# Patient Record
Sex: Male | Born: 1970 | Race: White | Hispanic: No | Smoking: Former smoker
Health system: Southern US, Community
[De-identification: ages and names within clinical notes are randomized; demographics above are authoritative.]

## PROBLEM LIST (undated history)

## (undated) DIAGNOSIS — M722 Plantar fascial fibromatosis: Secondary | ICD-10-CM

## (undated) DIAGNOSIS — E785 Hyperlipidemia, unspecified: Secondary | ICD-10-CM

## (undated) DIAGNOSIS — E119 Type 2 diabetes mellitus without complications: Secondary | ICD-10-CM

## (undated) DIAGNOSIS — I10 Essential (primary) hypertension: Secondary | ICD-10-CM

## (undated) DIAGNOSIS — R7303 Prediabetes: Secondary | ICD-10-CM

## (undated) DIAGNOSIS — F32A Depression, unspecified: Secondary | ICD-10-CM

## (undated) DIAGNOSIS — E559 Vitamin D deficiency, unspecified: Secondary | ICD-10-CM

## (undated) HISTORY — DX: Prediabetes: R73.03

## (undated) HISTORY — DX: Essential (primary) hypertension: I10

## (undated) HISTORY — DX: Morbid (severe) obesity due to excess calories: E66.01

## (undated) HISTORY — DX: Hyperlipidemia, unspecified: E78.5

## (undated) HISTORY — DX: Vitamin D deficiency, unspecified: E55.9

## (undated) HISTORY — DX: Type 2 diabetes mellitus without complications: E11.9

## (undated) HISTORY — DX: Plantar fascial fibromatosis: M72.2

## (undated) HISTORY — DX: Depression, unspecified: F32.A

---

## 1997-12-31 ENCOUNTER — Ambulatory Visit (HOSPITAL_COMMUNITY): Admission: RE | Admit: 1997-12-31 | Discharge: 1997-12-31 | Payer: Self-pay | Admitting: Urology

## 1997-12-31 ENCOUNTER — Encounter: Payer: Self-pay | Admitting: Urology

## 1999-05-23 ENCOUNTER — Encounter: Payer: Self-pay | Admitting: Internal Medicine

## 1999-05-23 ENCOUNTER — Ambulatory Visit (HOSPITAL_COMMUNITY): Admission: RE | Admit: 1999-05-23 | Discharge: 1999-05-23 | Payer: Self-pay | Admitting: Internal Medicine

## 2003-03-27 ENCOUNTER — Ambulatory Visit (HOSPITAL_COMMUNITY): Admission: RE | Admit: 2003-03-27 | Discharge: 2003-03-27 | Payer: Self-pay | Admitting: Internal Medicine

## 2003-08-01 ENCOUNTER — Emergency Department (HOSPITAL_COMMUNITY): Admission: EM | Admit: 2003-08-01 | Discharge: 2003-08-01 | Payer: Self-pay | Admitting: Emergency Medicine

## 2005-03-31 ENCOUNTER — Ambulatory Visit (HOSPITAL_COMMUNITY): Admission: RE | Admit: 2005-03-31 | Discharge: 2005-03-31 | Payer: Self-pay | Admitting: Internal Medicine

## 2007-10-21 ENCOUNTER — Ambulatory Visit: Payer: Self-pay | Admitting: Cardiovascular Disease

## 2007-10-21 ENCOUNTER — Ambulatory Visit: Payer: Self-pay

## 2008-04-27 ENCOUNTER — Encounter: Admission: RE | Admit: 2008-04-27 | Discharge: 2008-04-27 | Payer: Self-pay | Admitting: Internal Medicine

## 2010-03-01 ENCOUNTER — Ambulatory Visit (HOSPITAL_COMMUNITY)
Admission: RE | Admit: 2010-03-01 | Discharge: 2010-03-01 | Payer: Self-pay | Source: Home / Self Care | Attending: Internal Medicine | Admitting: Internal Medicine

## 2010-05-17 ENCOUNTER — Other Ambulatory Visit (HOSPITAL_COMMUNITY): Payer: Self-pay | Admitting: Internal Medicine

## 2010-05-17 ENCOUNTER — Ambulatory Visit (HOSPITAL_COMMUNITY)
Admission: RE | Admit: 2010-05-17 | Discharge: 2010-05-17 | Disposition: A | Discharge status: Home / Self Care | Payer: PRIVATE HEALTH INSURANCE | Source: Ambulatory Visit | Attending: Internal Medicine | Admitting: Internal Medicine

## 2010-05-17 DIAGNOSIS — R52 Pain, unspecified: Secondary | ICD-10-CM

## 2010-05-17 DIAGNOSIS — M25569 Pain in unspecified knee: Secondary | ICD-10-CM | POA: Insufficient documentation

## 2010-07-26 NOTE — Procedures (Signed)
[facility]                              EXERCISE Zada Girt   EMILIAN, STAWICKI                     MRN:          [id number]  DATE:10/21/2007                            DOB:          07-08-1970    REFERRING PHYSICIAN:  Lucky Cowboy, M.D.   PROCEDURE:  Exercise treadmill stress study.   INDICATION:  Mr. Hurrell is a pleasant 40 year old Caucasian male with  past medical history significant for tobacco abuse, hypertension,  hyperlipidemia, moderate obesity, and a family history of premature  coronary artery disease who had one episode of substernal chest pressure  while at rest 2 weeks ago.  He has had no further episodes of chest  pressure.  He is an active gentleman who is employed as a Investment banker, operational and plays  golf regularly with no symptoms of shortness of breath or chest pain.  An exercise treadmill stress test was done today to evaluate his  functional capacity as well as his anginal symptoms.   The patient followed the Bruce protocol.  His resting heart rate was 72  with a resting blood pressure of 121/63.  He exercised for 7 minutes,  entering stage 3 of the Bruce protocol and achieving 8.50 METS.  Peak  blood pressure was 180/72 with a peak heart rate of 142.  He experienced  no chest pain while exercising.  He did not achieve his target heart  rate, but did achieve 80% of his maximum predicted heart rate for age.  He elected to stop his treadmill test secondary to fatigue and dyspnea.  At 2 minutes of recovery, his heart rate had returned to 109.  His blood  pressure returned to 142/65 with the completion of the test.  There were  no diagnostic ST segment changes and no arrhythmias during the exercise.   ASSESSMENT:  No angina or arrhythmias with an exercise treadmill stress  test.  No diagnostic EKG changes at 80% of maximum predicted heart rate  for age.   RECOMMENDATION:  The patient exhibited moderate deconditioning on his  stress  test.  There were no diagnostic changes that were worrisome for  coronary artery disease.  I have encouraged him to institute a weight  loss plan with a routine exercise program.  He is to follow up with Dr.  Oneta Rack and is to alert [place] if he has any recurrence of his chest  discomfort.   No changes in medications made.     Verne Carrow, MD  Electronically Signed    CM/MedQ  DD: 10/21/2007  DT: 10/22/2007  Job #: [id number]   cc:   Lucky Cowboy, M.D.  Rollene Rotunda, MD, [facility] [address]

## 2011-05-09 ENCOUNTER — Encounter: Payer: Self-pay | Admitting: Gastroenterology

## 2011-05-15 ENCOUNTER — Encounter: Payer: Self-pay | Admitting: *Deleted

## 2011-05-18 ENCOUNTER — Ambulatory Visit: Payer: PRIVATE HEALTH INSURANCE | Admitting: Gastroenterology

## 2011-05-30 ENCOUNTER — Ambulatory Visit: Payer: PRIVATE HEALTH INSURANCE | Admitting: Gastroenterology

## 2011-10-17 ENCOUNTER — Other Ambulatory Visit (HOSPITAL_COMMUNITY): Payer: Self-pay | Admitting: Physician Assistant

## 2011-10-17 ENCOUNTER — Ambulatory Visit (HOSPITAL_COMMUNITY)
Admission: RE | Admit: 2011-10-17 | Discharge: 2011-10-17 | Disposition: A | Discharge status: Home / Self Care | Payer: Managed Care, Other (non HMO) | Source: Ambulatory Visit | Attending: Physician Assistant | Admitting: Physician Assistant

## 2011-10-17 DIAGNOSIS — I10 Essential (primary) hypertension: Secondary | ICD-10-CM
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
## 2013-06-06 IMAGING — CR DG CHEST 2V
2 series · 2 of 2 positions shown · non-contrast
Comparison: 03/31/2005

CLINICAL DATA: Hypertension.

CHEST - 2 VIEW

[view not recorded (1 of 2)]
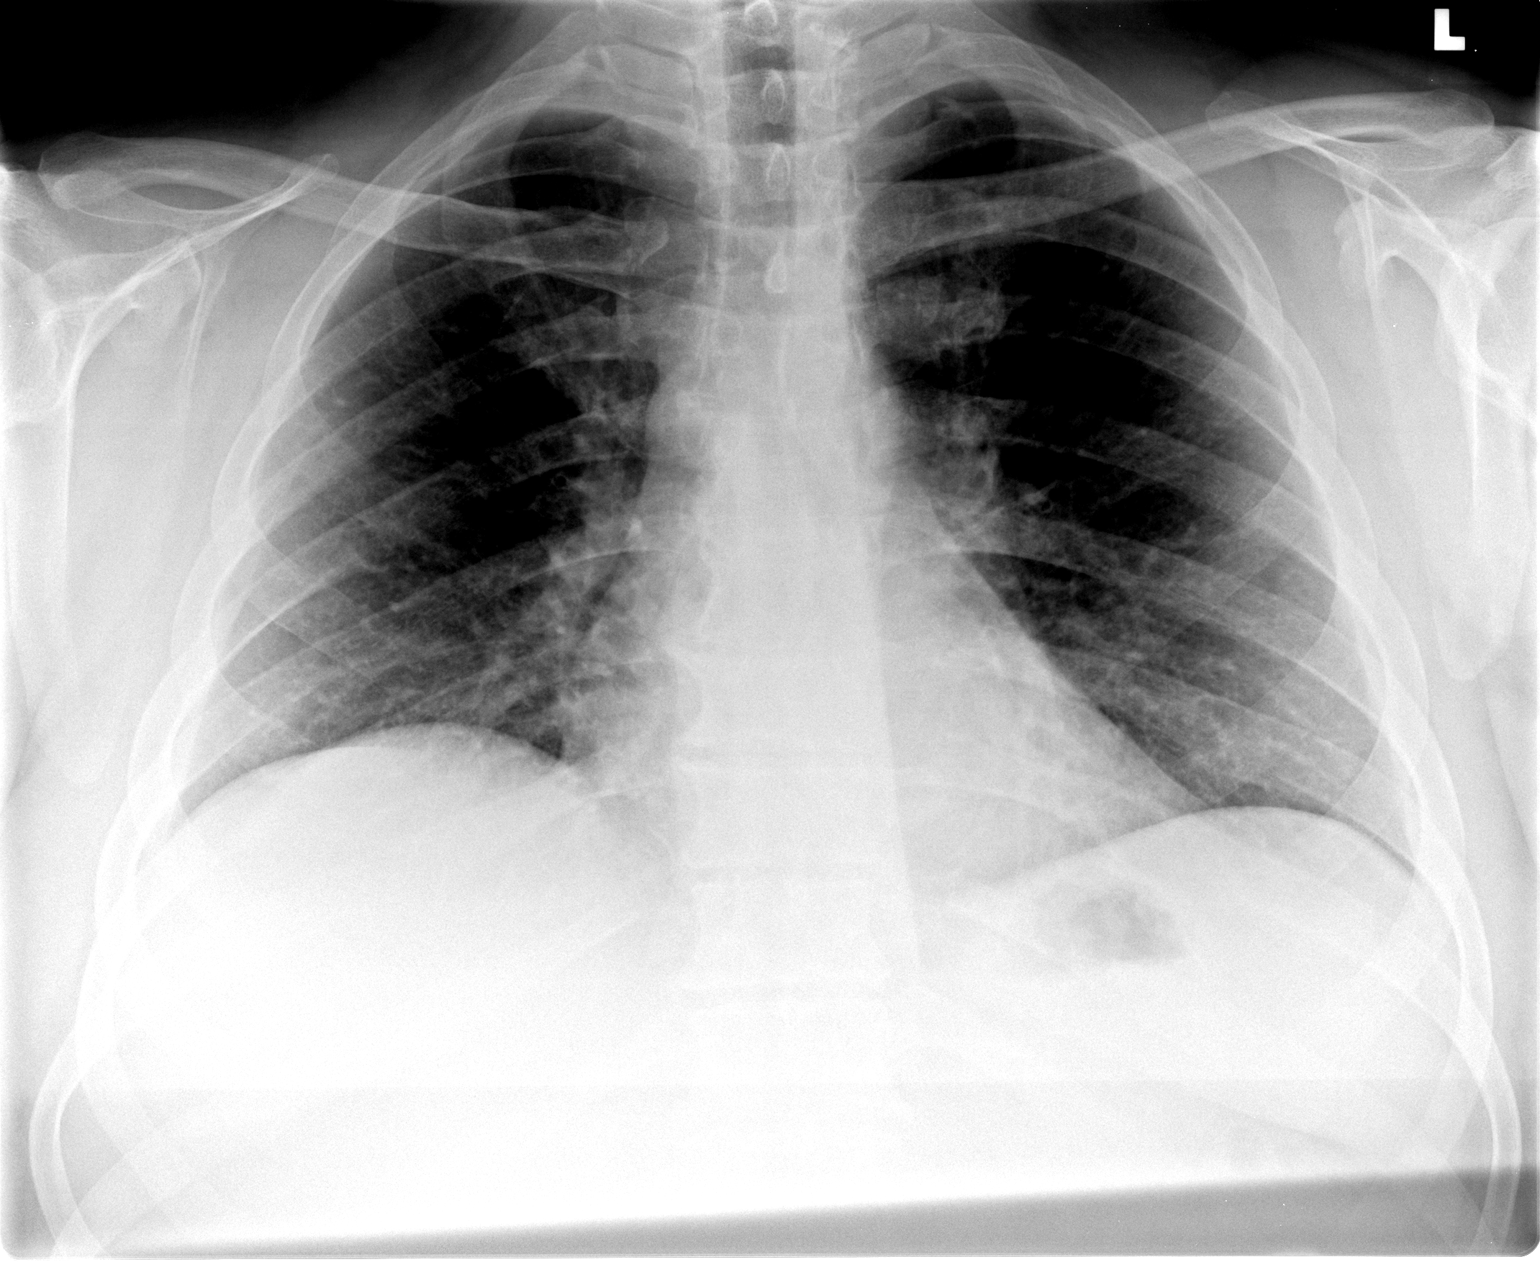

[view not recorded (2 of 2)]
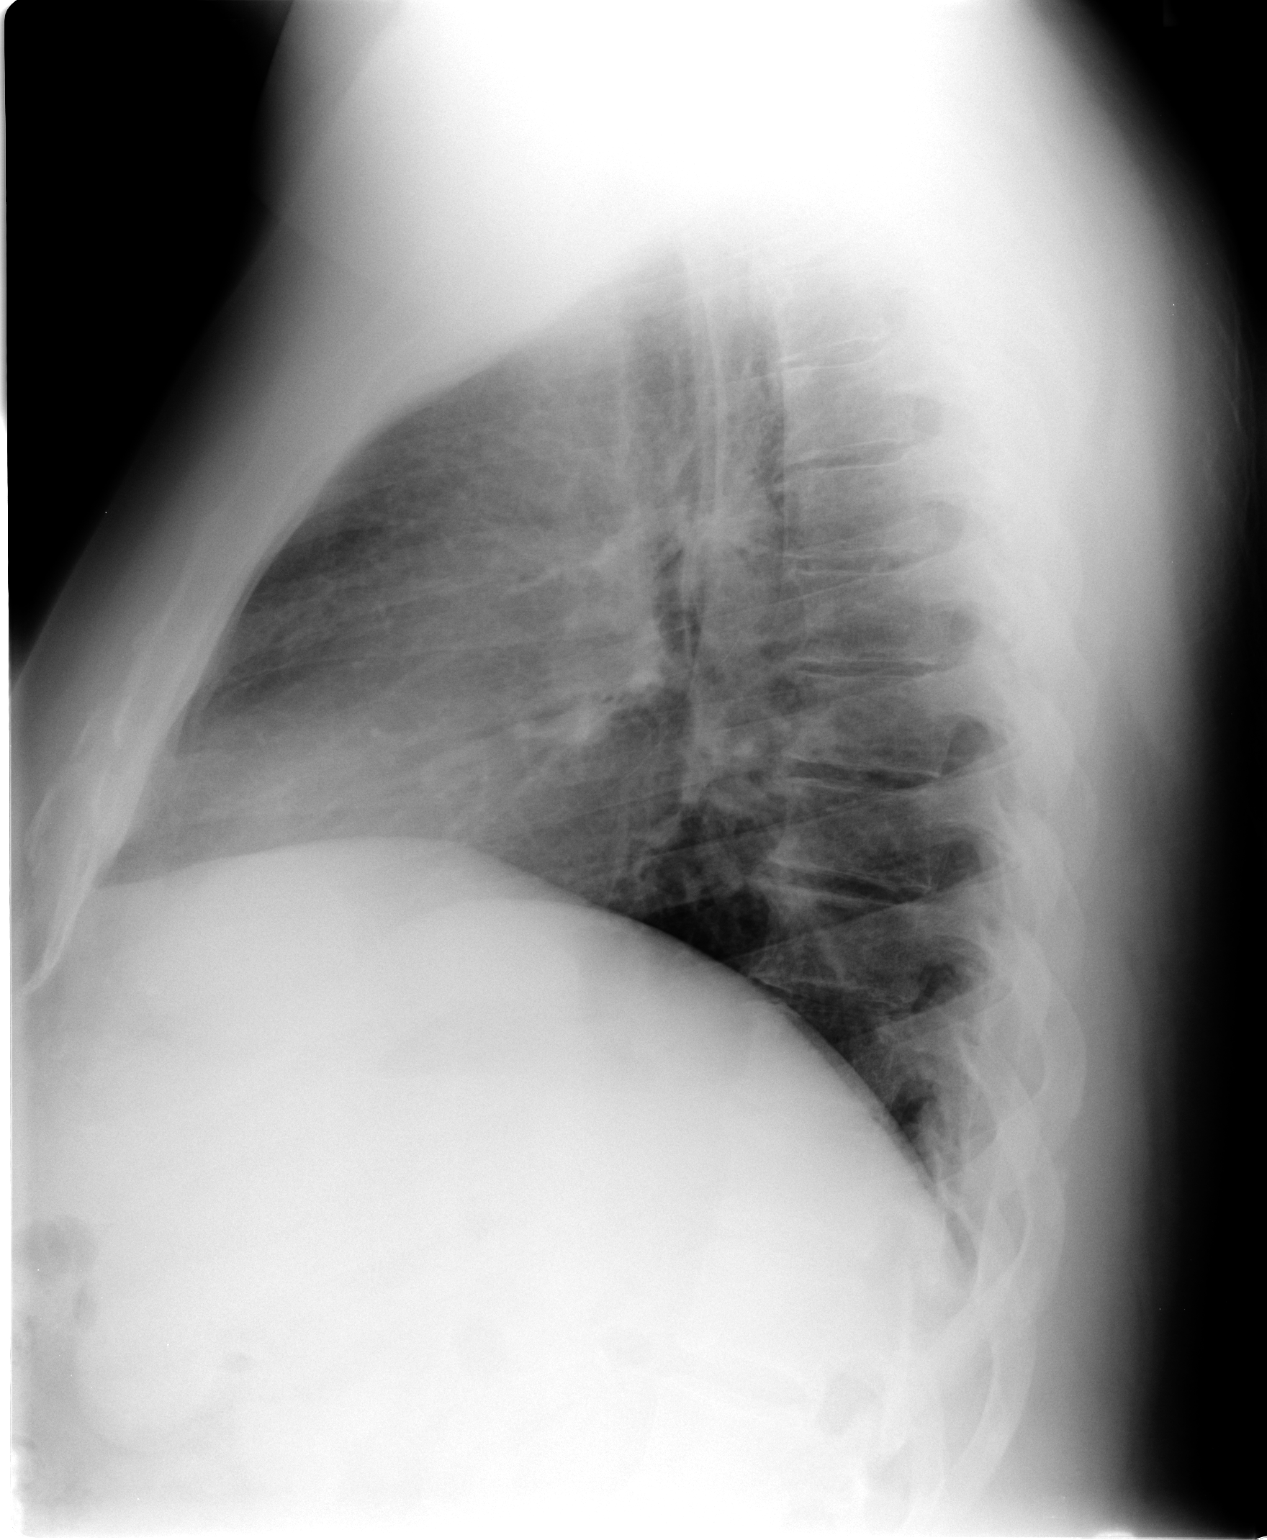

[2 of 2 positions shown; findings below may reference images not displayed]

FINDINGS: Mild central peribronchial thickening. Heart and
mediastinal contours are within normal limits.  No focal opacities
or effusions.  No acute bony abnormality.
IMPRESSION: Stable mild chronic bronchitic changes.

## 2016-04-05 ENCOUNTER — Encounter: Payer: Self-pay | Admitting: Internal Medicine

## 2016-04-05 ENCOUNTER — Ambulatory Visit (INDEPENDENT_AMBULATORY_CARE_PROVIDER_SITE_OTHER): Payer: 59 | Admitting: Internal Medicine

## 2016-04-05 ENCOUNTER — Other Ambulatory Visit: Payer: Self-pay | Admitting: Internal Medicine

## 2016-04-05 VITALS — BP 166/96 | HR 100 | Temp 97.5°F | Resp 16 | Ht 72.75 in | Wt 289.2 lb

## 2016-04-05 DIAGNOSIS — R7303 Prediabetes: Secondary | ICD-10-CM

## 2016-04-05 DIAGNOSIS — Z79899 Other long term (current) drug therapy: Secondary | ICD-10-CM

## 2016-04-05 DIAGNOSIS — F419 Anxiety disorder, unspecified: Secondary | ICD-10-CM

## 2016-04-05 DIAGNOSIS — E782 Mixed hyperlipidemia: Secondary | ICD-10-CM

## 2016-04-05 DIAGNOSIS — I10 Essential (primary) hypertension: Secondary | ICD-10-CM

## 2016-04-05 DIAGNOSIS — Z0001 Encounter for general adult medical examination with abnormal findings: Secondary | ICD-10-CM | POA: Insufficient documentation

## 2016-04-05 DIAGNOSIS — E559 Vitamin D deficiency, unspecified: Secondary | ICD-10-CM

## 2016-04-05 DIAGNOSIS — E785 Hyperlipidemia, unspecified: Secondary | ICD-10-CM | POA: Insufficient documentation

## 2016-04-05 DIAGNOSIS — E1169 Type 2 diabetes mellitus with other specified complication: Secondary | ICD-10-CM | POA: Insufficient documentation

## 2016-04-05 DIAGNOSIS — E119 Type 2 diabetes mellitus without complications: Secondary | ICD-10-CM | POA: Insufficient documentation

## 2016-04-05 MED ORDER — CITALOPRAM HYDROBROMIDE 40 MG PO TABS: ORAL_TABLET | ORAL | 1 refills | Status: DC

## 2016-04-05 MED ORDER — BISOPROLOL-HYDROCHLOROTHIAZIDE 5-6.25 MG PO TABS: ORAL_TABLET | ORAL | 1 refills | Status: DC

## 2016-04-05 NOTE — Patient Instructions (Signed)

## 2016-04-05 NOTE — Progress Notes (Signed)
[facility] Unk Pinto, M.D.        Uvaldo Bristle. Silverio Lay, P.A.-C       Starlyn Skeans, P.A.-C  [facility]                [address] [place], [place] [id number] Telephone [phone] Telefax [phone] ______________________________________________________________________     This very nice 46 y.o. separated WM returns to the practice after a 4-5 yr absence now moving back to [place] from the [place] and a failed marriage. As a result of this, he does report some chronic anxiety.  In the past he had been treated for  Hypertension, Hyperlipidemia, Pre-Diabetes and Vitamin D Deficiency but since his return last May 2017, he has not been on any treatment. .      Today's BP is not at goal at 166/96.  Patient has had no complaints of any cardiac type chest pain, palpitations, dyspnea/orthopnea/PND, dizziness, claudication, or dependent edema.     He alleges his lipids had been controlled on alternate day Pravastatin as he had been intolerant of daily statin therapy in the past due to severe myalgias. Also he had been on Tricor for Hypertriglyceridemia.         Other hx/o is that of prediabetes consequent of his severe Morbid Obesity (BMI 38+). He denies symptoms of reactive hypoglycemia, diabetic polys, paresthesias or visual blurring.       Further, the patient also has history of Vitamin D Deficiency and  does not supplement vitamin D  At the present time.   Current meds are ASA 325 mg and Fish Oil 100 mg daily.   No Known Allergies   PMHx:   Past Medical History:  Diagnosis Date  . Hyperlipidemia   . Hypertension   . Morbid obesity ([place])   . Plantar fasciitis   . Prediabetes   . Vitamin D deficiency     History reviewed. No pertinent surgical history.   FH (+) for HTN, HLD, Sudden Death , DM  SHx:  Separated x 2 years wit impending divorce. Employed as a Biomedical scientist.   Systems  Review:  Constitutional: Denies fever, chills, wt changes, headaches, insomnia, fatigue, night sweats, change in appetite. Eyes: Denies redness, blurred vision, diplopia, discharge, itchy, watery eyes.  ENT: Denies discharge, congestion, post nasal drip, epistaxis, sore throat, earache, hearing loss, dental pain, tinnitus, vertigo, sinus pain, snoring.  CV: Denies chest pain, palpitations, irregular heartbeat, syncope, dyspnea, diaphoresis, orthopnea, PND, claudication or edema. Respiratory: denies cough, dyspnea, DOE, pleurisy, hoarseness, laryngitis, wheezing.  Gastrointestinal: Denies dysphagia, odynophagia, heartburn, reflux, water brash, abdominal pain or cramps, nausea, vomiting, bloating, diarrhea, constipation, hematemesis, melena, hematochezia  or hemorrhoids. Genitourinary: Denies dysuria, frequency, urgency, nocturia, hesitancy, discharge, hematuria or flank pain. Musculoskeletal: Denies arthralgias, myalgias, stiffness, jt. swelling, pain, limping or strain/sprain.  Skin: Denies pruritus, rash, hives, warts, acne, eczema or change in skin lesion(s). Neuro: No weakness, tremor, incoordination, spasms, paresthesia or pain. Psychiatric: Denies confusion, memory loss or sensory loss. Endo: Denies change in weight, skin or hair change.  Heme/Lymph: No excessive bleeding, bruising or enlarged lymph nodes.  Physical Exam  BP (!) 166/96   Pulse 100   Temp 97.5 F (36.4 C)   Resp 16   Ht 6' 0.75" (1.848 m)   Wt 289 lb 3.2 oz (131.2 kg)   BMI 38.42 kg/m   Appears well nourished and  in no distress.  Eyes: PERRLA, EOMs, conjunctiva no swelling or erythema. Sinuses: No frontal/maxillary tenderness ENT/Mouth: EAC's clear, TM's nl w/o erythema, bulging. Nares clear w/o erythema, swelling, exudates. Oropharynx clear without erythema or exudates. Oral hygiene is good. Tongue normal, non obstructing. Hearing intact.  Neck: Supple. Thyroid nl. Car 2+/2+ without bruits, nodes or JVD. Chest:  Respirations nl with BS clear & equal w/o rales, rhonchi, wheezing or stridor.  Cor: Heart sounds normal w/ regular rate and rhythm without sig. murmurs, gallops, clicks, or rubs. Peripheral pulses normal and equal  without edema.  Abdomen: Soft & bowel sounds normal. Non-tender w/o guarding, rebound, hernias, masses, or organomegaly.  Lymphatics: Unremarkable.  Musculoskeletal: Full ROM all peripheral extremities, joint stability, 5/5 strength, and normal gait.  Skin: Warm, dry without exposed rashes, lesions or ecchymosis apparent.  Neuro: Cranial nerves intact, reflexes equal bilaterally. Sensory-motor testing grossly intact. Tendon reflexes grossly intact.  Pysch: Alert & oriented x 3.  Insight and judgement nl & appropriate. No ideations.  Assessment and Plan:  1. Essential hypertension  - monitor blood pressure at home.  - discussed  DASH diet. Reminder to go to the ER if any CP,  SOB, nausea, dizziness, severe HA, changes vision/speech,  left arm numbness and tingling and jaw pain.  - New Rx -bisoprolol-hctz [facility]) 5-6.25 MG tablet; Take 1 tab daily for BP  Disp: 90 tab; Rf: 1  - CBC with Differential/Platelet; Future - BASIC METABOLIC PANEL WITH GFR; Future - TSH; Future  2. Mixed hyperlipidemia  - Discussed diet/meds, exercise,& lifestyle modifications.  - Monitor periodic cholesterol/liver & renal functions  - Hepatic function panel; Future - Lipid panel; Future - TSH; Future  3. Prediabetes  - Discussed diet, exercise, lifestyle modifications.  - Encouraged weight lo - Monitor appropriate labs. - Hemoglobin A1c; Future - Insulin, random; Future  4. Vitamin D deficiency  - Recc start 10,000 units/daily  - VITAMIN D 25 Hydroxy   5. Anxiety tension state  - New Rx - citalopram 40 MG ; Take 1 tablet daily - Disp: 90 tab; Rf: 1  6. Medication management  - CBC with Differential/Platelet; Future - BASIC METABOLIC PANEL WITH GFR; Future - Hepatic function  panel; Future - Magnesium; Future - Lipid panel; Future - VITAMIN D 25 Hydroxy       Recommended regular exercise, BP monitoring, weight control, and discussed med and SE's. Recommended labs to assess and monitor clinical status. Further disposition pending results of labs. Over 30 minutes of exam, counseling, chart review was performed

## 2016-04-06 ENCOUNTER — Ambulatory Visit: Payer: 59

## 2016-04-06 DIAGNOSIS — Z79899 Other long term (current) drug therapy: Secondary | ICD-10-CM

## 2016-04-06 DIAGNOSIS — I10 Essential (primary) hypertension: Secondary | ICD-10-CM

## 2016-04-06 DIAGNOSIS — E559 Vitamin D deficiency, unspecified: Secondary | ICD-10-CM

## 2016-04-06 DIAGNOSIS — E782 Mixed hyperlipidemia: Secondary | ICD-10-CM

## 2016-04-06 DIAGNOSIS — R7303 Prediabetes: Secondary | ICD-10-CM

## 2016-04-06 LAB — CBC WITH DIFFERENTIAL/PLATELET
Basophils Absolute: 0 cells/uL (ref 0–200)
Basophils Relative: 0 %
Eosinophils Absolute: 388 cells/uL (ref 15–500)
Eosinophils Relative: 4 %
HCT: 47.3 % (ref 38.5–50.0)
Hemoglobin: 16.1 g/dL (ref 13.2–17.1)
Lymphocytes Relative: 32 %
Lymphs Abs: 3104 cells/uL (ref 850–3900)
MCH: 30.3 pg (ref 27.0–33.0)
MCHC: 34 g/dL (ref 32.0–36.0)
MCV: 88.9 fL (ref 80.0–100.0)
MPV: 10.2 fL (ref 7.5–12.5)
Monocytes Absolute: 1067 cells/uL — ABNORMAL HIGH (ref 200–950)
Monocytes Relative: 11 %
Neutro Abs: 5141 cells/uL (ref 1500–7800)
Neutrophils Relative %: 53 %
Platelets: 277 10*3/uL (ref 140–400)
RBC: 5.32 MIL/uL (ref 4.20–5.80)
RDW: 14.8 % (ref 11.0–15.0)
WBC: 9.7 10*3/uL (ref 3.8–10.8)

## 2016-04-06 LAB — HEPATIC FUNCTION PANEL
ALT: 33 U/L (ref 9–46)
AST: 23 U/L (ref 10–40)
Albumin: 3.6 g/dL (ref 3.6–5.1)
Alkaline Phosphatase: 95 U/L (ref 40–115)
Bilirubin, Direct: 0.1 mg/dL (ref <=0.2)
Indirect Bilirubin: 0.3 mg/dL (ref 0.2–1.2)
Total Bilirubin: 0.4 mg/dL (ref 0.2–1.2)
Total Protein: 6.8 g/dL (ref 6.1–8.1)

## 2016-04-06 LAB — TSH: TSH: 6.79 mIU/L — ABNORMAL HIGH (ref 0.40–4.50)

## 2016-04-06 LAB — BASIC METABOLIC PANEL WITH GFR
BUN: 16 mg/dL (ref 7–25)
CO2: 29 mmol/L (ref 20–31)
Calcium: 9.2 mg/dL (ref 8.6–10.3)
Chloride: 105 mmol/L (ref 98–110)
Creat: 0.86 mg/dL (ref 0.60–1.35)
GFR, Est African American: >89 mL/min (ref >=60)
GFR, Est Non African American: >89 mL/min (ref >=60)
Glucose, Bld: 108 mg/dL — ABNORMAL HIGH (ref 65–99)
Potassium: 4 mmol/L (ref 3.5–5.3)
Sodium: 141 mmol/L (ref 135–146)

## 2016-04-06 LAB — LIPID PANEL
Cholesterol: 277 mg/dL — ABNORMAL HIGH (ref <200)
HDL: 40 mg/dL — ABNORMAL LOW (ref >40)
LDL Cholesterol: 167 mg/dL — ABNORMAL HIGH (ref <100)
Total CHOL/HDL Ratio: 6.9 Ratio — ABNORMAL HIGH (ref <5.0)
Triglycerides: 351 mg/dL — ABNORMAL HIGH (ref <150)
VLDL: 70 mg/dL — ABNORMAL HIGH (ref <30)

## 2016-04-06 LAB — MAGNESIUM: Magnesium: 2 mg/dL (ref 1.5–2.5)

## 2016-04-07 ENCOUNTER — Other Ambulatory Visit: Payer: Self-pay | Admitting: Internal Medicine

## 2016-04-07 DIAGNOSIS — E782 Mixed hyperlipidemia: Secondary | ICD-10-CM

## 2016-04-07 DIAGNOSIS — E039 Hypothyroidism, unspecified: Secondary | ICD-10-CM

## 2016-04-07 LAB — HEMOGLOBIN A1C
Hgb A1c MFr Bld: 5.6 % (ref <5.7)
Mean Plasma Glucose: 114 mg/dL

## 2016-04-07 LAB — VITAMIN D 25 HYDROXY (VIT D DEFICIENCY, FRACTURES): Vit D, 25-Hydroxy: 19 ng/mL — ABNORMAL LOW (ref 30–100)

## 2016-04-07 LAB — INSULIN, RANDOM: Insulin: 67.4 u[IU]/mL — ABNORMAL HIGH (ref 2.0–19.6)

## 2016-04-07 MED ORDER — LEVOTHYROXINE SODIUM 50 MCG PO TABS: 50.0000 ug | ORAL_TABLET | Freq: Every day | ORAL | 1 refills | Status: DC

## 2016-04-07 MED ORDER — FENOFIBRATE 145 MG PO TABS: ORAL_TABLET | ORAL | 6 refills | Status: DC

## 2016-04-07 MED ORDER — ROSUVASTATIN CALCIUM 40 MG PO TABS: ORAL_TABLET | ORAL | 5 refills | Status: DC

## 2016-04-10 ENCOUNTER — Encounter: Payer: Self-pay | Admitting: *Deleted

## 2016-05-12 ENCOUNTER — Encounter: Payer: Self-pay | Admitting: Internal Medicine

## 2016-05-12 ENCOUNTER — Ambulatory Visit (INDEPENDENT_AMBULATORY_CARE_PROVIDER_SITE_OTHER): Payer: 59 | Admitting: Internal Medicine

## 2016-05-12 VITALS — BP 124/80 | HR 86 | Temp 98.2°F | Resp 18 | Ht 72.75 in | Wt 292.0 lb

## 2016-05-12 DIAGNOSIS — F411 Generalized anxiety disorder: Secondary | ICD-10-CM

## 2016-05-12 DIAGNOSIS — I10 Essential (primary) hypertension: Secondary | ICD-10-CM | POA: Diagnosis not present

## 2016-05-12 DIAGNOSIS — E039 Hypothyroidism, unspecified: Secondary | ICD-10-CM

## 2016-05-12 NOTE — Progress Notes (Signed)
Assessment and Plan:   1. Essential hypertension -cont ziac -good control without side effects  2. Anxiety -feels much better with celexa -will continue  3.  Hypothyroidism -TSH -try taking levothyroxine when gets home from work and then taking rest of evening meds 30 minutes later.       HPI 46 y.o.male presents for 1 month follow up of hypertension and anxiety.  He was seen by Dr. Melford Aase who started him on ziac for high blood pressure and also started him on celexa for anxiety.   He reports that he has been having good control of his blood pressure.  He reports that he has not identified any problems with it.  He sometimes misses a dose.  He tries to take the BP in the evening.  He reports that he also that he started celexa.  He feels like he is sleeping a lot better and he feels much better. He reports that he has had help with the depression.  He has no morning headaches.  He reports that he has mouth breathing with sleep and does not have gasping.    Past Medical History:  Diagnosis Date  . Hyperlipidemia   . Hypertension   . Morbid obesity ([place])   . Plantar fasciitis   . Prediabetes   . Vitamin D deficiency      No Known Allergies    Current Outpatient Prescriptions on File Prior to Visit  Medication Sig Dispense Refill  . aspirin 325 MG EC tablet Take 325 mg by mouth daily.    . bisoprolol-hydrochlorothiazide (ZIAC) 5-6.25 MG tablet Take 1 tablet daily for BP 90 tablet 1  . citalopram (CELEXA) 40 MG tablet Take 1 tablet daily 90 tablet 1  . levothyroxine (SYNTHROID) 50 MCG tablet Take 1 tablet (50 mcg total) by mouth daily. 90 tablet 1  . Omega-3 Fatty Acids (FISH OIL) 1000 MG CAPS Take 1 capsule by mouth daily.    . rosuvastatin (CRESTOR) 40 MG tablet Take 1/2 to 1 tablet daily or as directed for Cholesterol 30 tablet 5   No current facility-administered medications on file prior to visit.     ROS: all negative except above.   Physical Exam: Filed Weights   05/12/16 1117  Weight: 292 lb (132.5 kg)   BP 124/80   Pulse 86   Temp 98.2 F (36.8 C) (Temporal)   Resp 18   Ht 6' 0.75" (1.848 m)   Wt 292 lb (132.5 kg)   BMI 38.79 kg/m  General Appearance: Well developed well nourished, non-toxic appearing in no apparent distress. Eyes: PERRLA, EOMs, conjunctiva w/ no swelling or erythema or discharge Sinuses: No Frontal/maxillary tenderness ENT/Mouth: Ear canals clear without swelling or erythema.  TM's normal bilaterally with no retractions, bulging, or loss of landmarks.   Neck: Supple, thyroid normal, no notable JVD  Respiratory: Respiratory effort normal, Clear breath sounds anteriorly and posteriorly bilaterally without rales, rhonchi, wheezing or stridor. No retractions or accessory muscle usage. Cardio: RRR with no MRGs.   Abdomen: Soft, + BS.  Non tender, no guarding, rebound, hernias, masses.  Musculoskeletal: Full ROM, 5/5 strength, normal gait.  Skin: Warm, dry without rashes  Neuro: Awake and oriented X 3, Cranial nerves intact. Normal muscle tone, no cerebellar symptoms. Sensation intact.  Psych: normal affect, Insight and Judgment appropriate.     Starlyn Skeans, PA-C 11:39 AM [facility]

## 2016-07-10 NOTE — Progress Notes (Deleted)
Assessment and Plan:    HPI 46 y.o.male presents for 3 month follow up for HTN, chol, obesity, preDM and vitamin D His blood pressure has been controlled at home, today their BP is    He does not workout. He denies chest pain, shortness of breath, dizziness.  He is not on cholesterol medication and denies myalgias. His cholesterol is not at goal. The cholesterol last visit was:   Lab Results  Component Value Date   CHOL 277 (H) 04/06/2016   HDL 40 (L) 04/06/2016   LDLCALC 167 (H) 04/06/2016   TRIG 351 (H) 04/06/2016   CHOLHDL 6.9 (H) 04/06/2016   . Last A1C in the office was:  Lab Results  Component Value Date   HGBA1C 5.6 04/06/2016   Patient is on Vitamin D supplement.   Lab Results  Component Value Date   VD25OH 19 (L) 04/06/2016     He is on thyroid medication. His medication was not changed last visit.   Lab Results  Component Value Date   TSH 6.79 (H) 04/06/2016  .   Past Medical History:  Diagnosis Date  . Hyperlipidemia   . Hypertension   . Morbid obesity ([place])   . Plantar fasciitis   . Prediabetes   . Vitamin D deficiency      No Known Allergies    Current Outpatient Prescriptions on File Prior to Visit  Medication Sig Dispense Refill  . aspirin 325 MG EC tablet Take 325 mg by mouth daily.    . bisoprolol-hydrochlorothiazide (ZIAC) 5-6.25 MG tablet Take 1 tablet daily for BP 90 tablet 1  . citalopram (CELEXA) 40 MG tablet Take 1 tablet daily 90 tablet 1  . levothyroxine (SYNTHROID) 50 MCG tablet Take 1 tablet (50 mcg total) by mouth daily. 90 tablet 1  . Omega-3 Fatty Acids (FISH OIL) 1000 MG CAPS Take 1 capsule by mouth daily.    . rosuvastatin (CRESTOR) 40 MG tablet Take 1/2 to 1 tablet daily or as directed for Cholesterol 30 tablet 5   No current facility-administered medications on file prior to visit.     ROS: all negative except above.   Physical Exam: There were no vitals filed for this visit. There were no vitals taken for this  visit. General Appearance: Well developed well nourished, non-toxic appearing in no apparent distress. Eyes: PERRLA, EOMs, conjunctiva w/ no swelling or erythema or discharge Sinuses: No Frontal/maxillary tenderness ENT/Mouth: Ear canals clear without swelling or erythema.  TM's normal bilaterally with no retractions, bulging, or loss of landmarks.   Neck: Supple, thyroid normal, no notable JVD  Respiratory: Respiratory effort normal, Clear breath sounds anteriorly and posteriorly bilaterally without rales, rhonchi, wheezing or stridor. No retractions or accessory muscle usage. Cardio: RRR with no MRGs.   Abdomen: Soft, + BS.  Non tender, no guarding, rebound, hernias, masses.  Musculoskeletal: Full ROM, 5/5 strength, normal gait.  Skin: Warm, dry without rashes  Neuro: Awake and oriented X 3, Cranial nerves intact. Normal muscle tone, no cerebellar symptoms. Sensation intact.  Psych: normal affect, Insight and Judgment appropriate.     Vicie Mutters, PA-C 1:38 PM [facility]

## 2016-07-12 ENCOUNTER — Ambulatory Visit: Payer: Self-pay | Admitting: Internal Medicine

## 2016-07-12 ENCOUNTER — Ambulatory Visit: Payer: Self-pay | Admitting: Physician Assistant

## 2016-07-19 NOTE — Progress Notes (Deleted)
Assessment and Plan:    HPI 46 y.o.male presents for 3 month follow up for HTN, chol, obesity, preDM and vitamin D His blood pressure has been controlled at home, today their BP is    He does not workout. He denies chest pain, shortness of breath, dizziness.  He is not on cholesterol medication and denies myalgias. His cholesterol is not at goal. The cholesterol last visit was:   Lab Results  Component Value Date   CHOL 277 (H) 04/06/2016   HDL 40 (L) 04/06/2016   LDLCALC 167 (H) 04/06/2016   TRIG 351 (H) 04/06/2016   CHOLHDL 6.9 (H) 04/06/2016   . Last A1C in the office was:  Lab Results  Component Value Date   HGBA1C 5.6 04/06/2016   Patient is on Vitamin D supplement.   Lab Results  Component Value Date   VD25OH 19 (L) 04/06/2016     He is on thyroid medication. His medication was not changed last visit.   Lab Results  Component Value Date   TSH 6.79 (H) 04/06/2016  .   Past Medical History:  Diagnosis Date  . Hyperlipidemia   . Hypertension   . Morbid obesity ([place])   . Plantar fasciitis   . Prediabetes   . Vitamin D deficiency      No Known Allergies    Current Outpatient Prescriptions on File Prior to Visit  Medication Sig Dispense Refill  . aspirin 325 MG EC tablet Take 325 mg by mouth daily.    . bisoprolol-hydrochlorothiazide (ZIAC) 5-6.25 MG tablet Take 1 tablet daily for BP 90 tablet 1  . citalopram (CELEXA) 40 MG tablet Take 1 tablet daily 90 tablet 1  . levothyroxine (SYNTHROID) 50 MCG tablet Take 1 tablet (50 mcg total) by mouth daily. 90 tablet 1  . Omega-3 Fatty Acids (FISH OIL) 1000 MG CAPS Take 1 capsule by mouth daily.    . rosuvastatin (CRESTOR) 40 MG tablet Take 1/2 to 1 tablet daily or as directed for Cholesterol 30 tablet 5   No current facility-administered medications on file prior to visit.     ROS: all negative except above.   Physical Exam: There were no vitals filed for this visit. There were no vitals taken for this  visit. General Appearance: Well developed well nourished, non-toxic appearing in no apparent distress. Eyes: PERRLA, EOMs, conjunctiva w/ no swelling or erythema or discharge Sinuses: No Frontal/maxillary tenderness ENT/Mouth: Ear canals clear without swelling or erythema.  TM's normal bilaterally with no retractions, bulging, or loss of landmarks.   Neck: Supple, thyroid normal, no notable JVD  Respiratory: Respiratory effort normal, Clear breath sounds anteriorly and posteriorly bilaterally without rales, rhonchi, wheezing or stridor. No retractions or accessory muscle usage. Cardio: RRR with no MRGs.   Abdomen: Soft, + BS.  Non tender, no guarding, rebound, hernias, masses.  Musculoskeletal: Full ROM, 5/5 strength, normal gait.  Skin: Warm, dry without rashes  Neuro: Awake and oriented X 3, Cranial nerves intact. Normal muscle tone, no cerebellar symptoms. Sensation intact.  Psych: normal affect, Insight and Judgment appropriate.     Vicie Mutters, PA-C 7:39 AM [facility]

## 2016-07-20 ENCOUNTER — Ambulatory Visit: Payer: Self-pay | Admitting: Physician Assistant

## 2016-09-26 NOTE — Progress Notes (Deleted)
Assessment and Plan:   Hypertension -Continue medication, monitor blood pressure at home. Continue DASH diet.  Reminder to go to the ER if any CP, SOB, nausea, dizziness, severe HA, changes vision/speech, left arm numbness and tingling and jaw pain.  Cholesterol -Continue diet and exercise. Check cholesterol.    Prediabetes  -Continue diet and exercise. Check A1C  Vitamin D Def - check level and continue medications.   Continue diet and meds as discussed. Further disposition pending results of labs. Over 30 minutes of exam, counseling, chart review, and critical decision making was performed  Future Appointments Date Time Provider Val[place]  09/27/2016 2:45 PM Charles Mutters, PA-C [facility] None     HPI 46 y.o. male  presents for 3 month follow up on hypertension, cholesterol, prediabetes, and vitamin D deficiency.   His blood pressure {HAS HAS NOT:18834} been controlled at home, today their BP is    He {DOES_DOES ZPH:15056} workout. He denies chest pain, shortness of breath, dizziness.  He {ACTION; IS/IS PVX:[id number]} on cholesterol medication and denies myalgias. His cholesterol {ACTION; IS/IS NOT:21021397} at goal. The cholesterol last visit was:   Lab Results  Component Value Date   CHOL 277 (H) 04/06/2016   HDL 40 (L) 04/06/2016   LDLCALC 167 (H) 04/06/2016   TRIG 351 (H) 04/06/2016   CHOLHDL 6.9 (H) 04/06/2016   Last A1C in the office was:  Lab Results  Component Value Date   HGBA1C 5.6 04/06/2016   Patient is on Vitamin D supplement.   Lab Results  Component Value Date   VD25OH 19 (L) 04/06/2016     He is on thyroid medication. His medication was changed last visit.   Lab Results  Component Value Date   TSH 6.79 (H) 04/06/2016  .  BMI is There is no height or weight on file to calculate BMI., he is working on diet and exercise. Wt Readings from Last 3 Encounters:  05/12/16 292 lb (132.5 kg)  04/05/16 289 lb 3.2 oz (131.2 kg)     Current  Medications:  Current Outpatient Prescriptions on File Prior to Visit  Medication Sig  . aspirin 325 MG EC tablet Take 325 mg by mouth daily.  . bisoprolol-hydrochlorothiazide (ZIAC) 5-6.25 MG tablet Take 1 tablet daily for BP  . citalopram (CELEXA) 40 MG tablet Take 1 tablet daily  . levothyroxine (SYNTHROID) 50 MCG tablet Take 1 tablet (50 mcg total) by mouth daily.  . Omega-3 Fatty Acids (FISH OIL) 1000 MG CAPS Take 1 capsule by mouth daily.  . rosuvastatin (CRESTOR) 40 MG tablet Take 1/2 to 1 tablet daily or as directed for Cholesterol   No current facility-administered medications on file prior to visit.     Medical History:  Past Medical History:  Diagnosis Date  . Hyperlipidemia   . Hypertension   . Morbid obesity ([place])   . Plantar fasciitis   . Prediabetes   . Vitamin D deficiency    Allergies: No Known Allergies   Review of Systems:  ROS  Family history- Review and unchanged Social history- Review and unchanged Physical Exam: There were no vitals taken for this visit. Wt Readings from Last 3 Encounters:  05/12/16 292 lb (132.5 kg)  04/05/16 289 lb 3.2 oz (131.2 kg)   General Appearance: Well nourished, in no apparent distress. Eyes: PERRLA, EOMs, conjunctiva no swelling or erythema Sinuses: No Frontal/maxillary tenderness ENT/Mouth: Ext aud canals clear, TMs without erythema, bulging. No erythema, swelling, or exudate on post pharynx.  Tonsils  not swollen or erythematous. Hearing normal.  Neck: Supple, thyroid normal.  Respiratory: Respiratory effort normal, BS equal bilaterally without rales, rhonchi, wheezing or stridor.  Cardio: RRR with no MRGs. Brisk peripheral pulses without edema.  Abdomen: Soft, + BS,  Non tender, no guarding, rebound, hernias, masses. Lymphatics: Non tender without lymphadenopathy.  Musculoskeletal: Full ROM, 5/5 strength, {PSY - GAIT AND STATION:22860} gait Skin: Warm, dry without rashes, lesions, ecchymosis.  Neuro: Cranial nerves  intact. Normal muscle tone, no cerebellar symptoms. Psych: Awake and oriented X 3, normal affect, Insight and Judgment appropriate.    Charles Mutters, PA-C 9:41 AM [facility]

## 2016-09-27 ENCOUNTER — Ambulatory Visit: Payer: Self-pay | Admitting: Physician Assistant

## 2016-11-07 DIAGNOSIS — E039 Hypothyroidism, unspecified: Secondary | ICD-10-CM | POA: Insufficient documentation

## 2016-11-07 NOTE — Progress Notes (Signed)
Assessment and Plan:   Essential hypertension -cont ziac -good control without side effects  Anxiety-  continue medications, stress management techniques discussed, increase water, good sleep hygiene discussed, increase exercise, and increase veggies.  - do celexa 20mg  daily  Hypothyroidism Hypothyroidism-check TSH level, continue medications the same, reminded to take on an empty stomach 30-21mins before food.  Will check level  Mixed hyperlipidemia -if needs meds will do zetia or fenofibrate, declines statins due to myaglias.  check lipids, decrease fatty foods, increase activity.  -     Lipid panel  Prediabetes Discussed general issues about diabetes pathophysiology and management., Educational material distributed., Suggested low cholesterol diet., Encouraged aerobic exercise., Discussed foot care., Reminded to get yearly retinal exam. -     Hemoglobin A1c  Medication management -     Magnesium  Morbid Obesity with co morbidities - long discussion about weight loss, diet, and exercise  Vitamin D deficiency -     VITAMIN D 25 Hydroxy (Vit-D Deficiency, Fractures)   Future Appointments Date Time Provider [place]  02/13/2017 10:30 AM Vicie Mutters, PA-C [facility] None    HPI 46 y.o.male presents for 6 month follow up for anxiety, TSH, and HTN.  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 He will go back between sleeping a lot or none, states he did not sleep for 5 days. Going through court, can't see kids how he use to. Not taking the celexa.  His blood pressure has been controlled at home, today their BP is BP: 130/86  He does not workout. He denies chest pain, shortness of breath, dizziness.  He is not on cholesterol medication, states statins cause muscle aches, willing to try zetia or fenofibrate. His  cholesterol is at goal. The cholesterol last visit was:   Lab Results  Component Value Date   CHOL 277 (H) 04/06/2016   HDL 40 (L) 04/06/2016   LDLCALC 167 (H) 04/06/2016   TRIG 351 (H) 04/06/2016   CHOLHDL 6.9 (H) 04/06/2016    He has been working on diet and exercise for prediabetes, and denies paresthesia of the feet, polydipsia, polyuria and visual disturbances. Last A1C in the office was:  Lab Results  Component Value Date   HGBA1C 5.6 04/06/2016   Patient is on Vitamin D supplement.   Lab Results  Component Value Date   VD25OH 19 (L) 04/06/2016     He is on thyroid medication. His medication was not changed last visit. He admits that with his job he is not taking it well,   Will take with food.  Lab Results  Component Value Date   TSH 6.79 (H) 04/06/2016  .  BMI is Body mass index is 40.33 kg/m., he is working on diet and exercise. Wt Readings from Last 3 Encounters:  11/08/16 (!) 303 lb 9.6 oz (137.7 kg)  05/12/16 292 lb (132.5 kg)  04/05/16 289 lb 3.2 oz (131.2 kg)   .    Past Medical History:  Diagnosis Date  . Hyperlipidemia   . Hypertension   . Morbid obesity ([place])   . Plantar fasciitis   . Prediabetes   . Vitamin D deficiency      No Known Allergies    Current Outpatient Prescriptions on File Prior to Visit  Medication Sig Dispense Refill  . aspirin 325 MG EC tablet Take  325 mg by mouth daily.    Marland Kitchen levothyroxine (SYNTHROID) 50 MCG tablet Take 1 tablet (50 mcg total) by mouth daily. 90 tablet 1  . Omega-3 Fatty Acids (FISH OIL) 1000 MG CAPS Take 1 capsule by mouth daily.    . bisoprolol-hydrochlorothiazide (ZIAC) 5-6.25 MG tablet Take 1 tablet daily for BP 90 tablet 1  . citalopram (CELEXA) 40 MG tablet Take 1 tablet daily 90 tablet 1  . rosuvastatin (CRESTOR) 40 MG tablet Take 1/2 to 1 tablet daily or as directed for Cholesterol 30 tablet 5   No current facility-administered medications on file prior to visit.     ROS: all negative except above.    Physical Exam: Filed Weights   11/08/16 1120  Weight: (!) 303 lb 9.6 oz (137.7 kg)   BP 130/86   Pulse 78   Temp (!) 97.3 F (36.3 C)   Resp 18   Ht 6' 0.75" (1.848 m)   Wt (!) 303 lb 9.6 oz (137.7 kg)   SpO2 95%   BMI 40.33 kg/m  General Appearance: Well developed well nourished, non-toxic appearing in no apparent distress. Eyes: PERRLA, EOMs, conjunctiva w/ no swelling or erythema or discharge Sinuses: No Frontal/maxillary tenderness ENT/Mouth: Ear canals clear without swelling or erythema.  TM's normal bilaterally with no retractions, bulging, or loss of landmarks.   Neck: Supple, thyroid normal, no notable JVD  Respiratory: Respiratory effort normal, Clear breath sounds anteriorly and posteriorly bilaterally without rales, rhonchi, wheezing or stridor. No retractions or accessory muscle usage. Cardio: RRR with no MRGs.   Abdomen: Soft, + BS.  Non tender, no guarding, rebound, hernias, masses.  Musculoskeletal: Full ROM, 5/5 strength, normal gait.  Skin: Warm, dry without rashes  Neuro: Awake and oriented X 3, Cranial nerves intact. Normal muscle tone, no cerebellar symptoms. Sensation intact.  Psych: normal affect, Insight and Judgment appropriate.     Vicie Mutters, PA-C 11:29 AM [facility]

## 2016-11-08 ENCOUNTER — Encounter: Payer: Self-pay | Admitting: Physician Assistant

## 2016-11-08 ENCOUNTER — Ambulatory Visit (INDEPENDENT_AMBULATORY_CARE_PROVIDER_SITE_OTHER): Payer: 59 | Admitting: Physician Assistant

## 2016-11-08 VITALS — BP 130/86 | HR 78 | Temp 97.3°F | Resp 18 | Ht 72.75 in | Wt 303.6 lb

## 2016-11-08 DIAGNOSIS — I10 Essential (primary) hypertension: Secondary | ICD-10-CM

## 2016-11-08 DIAGNOSIS — E039 Hypothyroidism, unspecified: Secondary | ICD-10-CM

## 2016-11-08 DIAGNOSIS — Z79899 Other long term (current) drug therapy: Secondary | ICD-10-CM

## 2016-11-08 DIAGNOSIS — E559 Vitamin D deficiency, unspecified: Secondary | ICD-10-CM | POA: Diagnosis not present

## 2016-11-08 DIAGNOSIS — E782 Mixed hyperlipidemia: Secondary | ICD-10-CM | POA: Diagnosis not present

## 2016-11-08 DIAGNOSIS — R7303 Prediabetes: Secondary | ICD-10-CM | POA: Diagnosis not present

## 2016-11-08 NOTE — Patient Instructions (Addendum)
Call your insurance and see who is covered for counseling in your area Call a few and see if they specialize in cognitive behavior therapy Start on celexa 40mg  do 1/2 pill at night for sleep/mood  Hypothyroidism Hypothyroidism is a disorder of the thyroid. The thyroid is a large gland that is located in the lower front of the neck. The thyroid releases hormones that control how the body works. With hypothyroidism, the thyroid does not make enough of these hormones. What are the causes? Causes of hypothyroidism may include:  Viral infections.  Pregnancy.  Your own defense system (immune system) attacking your thyroid.  Certain medicines.  Birth defects.  Past radiation treatments to your head or neck.  Past treatment with radioactive iodine.  Past surgical removal of part or all of your thyroid.  Problems with the gland that is located in the center of your brain (pituitary).  What are the signs or symptoms? Signs and symptoms of hypothyroidism may include:  Feeling as though you have no energy (lethargy).  Inability to tolerate cold.  Weight gain that is not explained by a change in diet or exercise habits.  Dry skin.  Coarse hair.  Menstrual irregularity.  Slowing of thought processes.  Constipation.  Sadness or depression.  How is this diagnosed? Your health care provider may diagnose hypothyroidism with blood tests and ultrasound tests. How is this treated? Hypothyroidism is treated with medicine that replaces the hormones that your body does not make. After you begin treatment, it may take several weeks for symptoms to go away. Follow these instructions at home:  Take medicines only as directed by your health care provider.  If you start taking any new medicines, tell your health care provider.  Keep all follow-up visits as directed by your health care provider. This is important. As your condition improves, your dosage needs may change. You will need to  have blood tests regularly so that your health care provider can watch your condition. Contact a health care provider if:  Your symptoms do not get better with treatment.  You are taking thyroid replacement medicine and: ? You sweat excessively. ? You have tremors. ? You feel anxious. ? You lose weight rapidly. ? You cannot tolerate heat. ? You have emotional swings. ? You have diarrhea. ? You feel weak. Get help right away if:  You develop chest pain.  You develop an irregular heartbeat.  You develop a rapid heartbeat. This information is not intended to replace advice given to you by your health care provider. Make sure you discuss any questions you have with your health care provider. Document Released: 02/27/2005 Document Revised: 08/05/2015 Document Reviewed: 07/15/2013 Elsevier Interactive Patient Education  2017 [employer].   Vitamin D goal is between 60-80  Please make sure that you are taking your Vitamin D as directed.   It is very important as a natural anti-inflammatory   helping hair, skin, and nails, as well as reducing stroke and heart attack risk.   It helps your bones and helps with mood.  We want you on at least 5000 IU daily  It also decreases numerous cancer risks so please take it as directed.   Low Vit D is associated with a 200-300% higher risk for CANCER   and 200-300% higher risk for HEART   ATTACK  &  STROKE.    .....................................Marland Kitchen  It is also associated with higher death rate at younger ages,   autoimmune diseases like Rheumatoid arthritis, Lupus, Multiple Sclerosis.  Also many other serious conditions, like depression, Alzheimer's  Dementia, infertility, muscle aches, fatigue, fibromyalgia - just to name a few.  +++++++++++++++++++  Can get liquid vitamin D from [place] here in [place] at  [facility] alternatives [address] [place],  [address] Or you can try earth fare   Generalized Anxiety  Disorder, Adult Generalized anxiety disorder (GAD) is a mental health disorder. People with this condition constantly worry about everyday events. Unlike normal anxiety, worry related to GAD is not triggered by a specific event. These worries also do not fade or get better with time. GAD interferes with life functions, including relationships, work, and school. GAD can vary from mild to severe. People with severe GAD can have intense waves of anxiety with physical symptoms (panic attacks). What are the causes? The exact cause of GAD is not known. What increases the risk? This condition is more likely to develop in:  Women.  People who have a family history of anxiety disorders.  People who are very shy.  People who experience very stressful life events, such as the death of a loved one.  People who have a very stressful family environment.  What are the signs or symptoms? People with GAD often worry excessively about many things in their lives, such as their health and family. They may also be overly concerned about:  Doing well at work.  Being on time.  Natural disasters.  Friendships.  Physical symptoms of GAD include:  Fatigue.  Muscle tension or having muscle twitches.  Trembling or feeling shaky.  Being easily startled.  Feeling like your heart is pounding or racing.  Feeling out of breath or like you cannot take a deep breath.  Having trouble falling asleep or staying asleep.  Sweating.  Nausea, diarrhea, or irritable bowel syndrome (IBS).  Headaches.  Trouble concentrating or remembering facts.  Restlessness.  Irritability.  How is this diagnosed? Your health care provider can diagnose GAD based on your symptoms and medical history. You will also have a physical exam. The health care provider will ask specific questions about your symptoms, including how severe they are, when they started, and if they come and go. Your health care provider may ask you  about your use of alcohol or drugs, including prescription medicines. Your health care provider may refer you to a mental health specialist for further evaluation. Your health care provider will do a thorough examination and may perform additional tests to rule out other possible causes of your symptoms. To be diagnosed with GAD, a person must have anxiety that:  Is out of his or her control.  Affects several different aspects of his or her life, such as work and relationships.  Causes distress that makes him or her unable to take part in normal activities.  Includes at least three physical symptoms of GAD, such as restlessness, fatigue, trouble concentrating, irritability, muscle tension, or sleep problems.  Before your health care provider can confirm a diagnosis of GAD, these symptoms must be present more days than they are not, and they must last for six months or longer. How is this treated? The following therapies are usually used to treat GAD:  Medicine. Antidepressant medicine is usually prescribed for long-term daily control. Antianxiety medicines may be added in severe cases, especially when panic attacks occur.  Talk therapy (psychotherapy). Certain types of talk therapy can be helpful in treating GAD by providing support, education, and guidance. Options include: ? Cognitive behavioral therapy (CBT). People learn coping  skills and techniques to ease their anxiety. They learn to identify unrealistic or negative thoughts and behaviors and to replace them with positive ones. ? Acceptance and commitment therapy (ACT). This treatment teaches people how to be mindful as a way to cope with unwanted thoughts and feelings. ? Biofeedback. This process trains you to manage your body's response (physiological response) through breathing techniques and relaxation methods. You will work with a therapist while machines are used to monitor your physical symptoms.  Stress management techniques.  These include yoga, meditation, and exercise.  A mental health specialist can help determine which treatment is best for you. Some people see improvement with one type of therapy. However, other people require a combination of therapies. Follow these instructions at home:  Take over-the-counter and prescription medicines only as told by your health care provider.  Try to maintain a normal routine.  Try to anticipate stressful situations and allow extra time to manage them.  Practice any stress management or self-calming techniques as taught by your health care provider.  Do not punish yourself for setbacks or for not making progress.  Try to recognize your accomplishments, even if they are small.  Keep all follow-up visits as told by your health care provider. This is important. Contact a health care provider if:  Your symptoms do not get better.  Your symptoms get worse.  You have signs of depression, such as: ? A persistently sad, cranky, or irritable mood. ? Loss of enjoyment in activities that used to bring you joy. ? Change in weight or eating. ? Changes in sleeping habits. ? Avoiding friends or family members. ? Loss of energy for normal tasks. ? Feelings of guilt or worthlessness. Get help right away if:  You have serious thoughts about hurting yourself or others. If you ever feel like you may hurt yourself or others, or have thoughts about taking your own life, get help right away. You can go to your nearest emergency department or call:  Your local emergency services (911 in the [place]  A suicide crisis helpline, such as the [facility] at 740-135-2574. This is open 24 hours a day.  Summary  Generalized anxiety disorder (GAD) is a mental health disorder that involves worry that is not triggered by a specific event.  People with GAD often worry excessively about many things in their lives, such as their health and family.  GAD may  cause physical symptoms such as restlessness, trouble concentrating, sleep problems, frequent sweating, nausea, diarrhea, headaches, and trembling or muscle twitching.  A mental health specialist can help determine which treatment is best for you. Some people see improvement with one type of therapy. However, other people require a combination of therapies. This information is not intended to replace advice given to you by your health care provider. Make sure you discuss any questions you have with your health care provider. Document Released: 06/24/2012 Document Revised: 01/18/2016 Document Reviewed: 01/18/2016 Elsevier Interactive Patient Education  2018 [employer].   Thyroid level is not in range. Please make sure you are taking your thyroid medication 60 mins before food with just water. Do not take it within 4 hours of calcium, magnesium, stomach medications like tums, zantac, prilosec for example.    Hypothyroidism can cause slow metabolism, fatigue, weight gain, dry hair/skin, constipation, swelling, decreased memory/brain fog.

## 2016-11-09 LAB — CBC WITH DIFFERENTIAL/PLATELET
Basophils Absolute: 64 cells/uL (ref 0–200)
Basophils Relative: 0.7 %
Eosinophils Absolute: 300 cells/uL (ref 15–500)
Eosinophils Relative: 3.3 %
HCT: 45.1 % (ref 38.5–50.0)
Hemoglobin: 15.1 g/dL (ref 13.2–17.1)
Lymphs Abs: 2375 cells/uL (ref 850–3900)
MCH: 29.8 pg (ref 27.0–33.0)
MCHC: 33.5 g/dL (ref 32.0–36.0)
MCV: 89.1 fL (ref 80.0–100.0)
MPV: 11 fL (ref 7.5–12.5)
Monocytes Relative: 7.4 %
Neutro Abs: 5688 cells/uL (ref 1500–7800)
Neutrophils Relative %: 62.5 %
Platelets: 266 10*3/uL (ref 140–400)
RBC: 5.06 10*6/uL (ref 4.20–5.80)
RDW: 13.9 % (ref 11.0–15.0)
Total Lymphocyte: 26.1 %
WBC mixed population: 673 cells/uL (ref 200–950)
WBC: 9.1 10*3/uL (ref 3.8–10.8)

## 2016-11-09 LAB — VITAMIN D 25 HYDROXY (VIT D DEFICIENCY, FRACTURES): Vit D, 25-Hydroxy: 21 ng/mL — ABNORMAL LOW (ref 30–100)

## 2016-11-09 LAB — BASIC METABOLIC PANEL WITH GFR
BUN: 11 mg/dL (ref 7–25)
CO2: 28 mmol/L (ref 20–32)
Calcium: 9.2 mg/dL (ref 8.6–10.3)
Chloride: 99 mmol/L (ref 98–110)
Creat: 0.97 mg/dL (ref 0.60–1.35)
GFR, Est African American: 107 mL/min/{1.73_m2} (ref >=60)
GFR, Est Non African American: 93 mL/min/{1.73_m2} (ref >=60)
Glucose, Bld: 178 mg/dL — ABNORMAL HIGH (ref 65–99)
Potassium: 4.2 mmol/L (ref 3.5–5.3)
Sodium: 137 mmol/L (ref 135–146)

## 2016-11-09 LAB — HEMOGLOBIN A1C
Hgb A1c MFr Bld: 8.4 % of total Hgb — ABNORMAL HIGH (ref <5.7)
Mean Plasma Glucose: 194 (calc)
eAG (mmol/L): 10.8 (calc)

## 2016-11-09 LAB — TSH: TSH: 1.81 mIU/L (ref 0.40–4.50)

## 2016-11-09 LAB — MAGNESIUM: Magnesium: 1.6 mg/dL (ref 1.5–2.5)

## 2016-11-09 LAB — HEPATIC FUNCTION PANEL
AG Ratio: 1.3 (calc) (ref 1.0–2.5)
ALT: 40 U/L (ref 9–46)
AST: 26 U/L (ref 10–40)
Albumin: 3.8 g/dL (ref 3.6–5.1)
Alkaline phosphatase (APISO): 111 U/L (ref 40–115)
Bilirubin, Direct: 0.1 mg/dL (ref 0.0–0.2)
Globulin: 2.9 g/dL (calc) (ref 1.9–3.7)
Indirect Bilirubin: 0.3 mg/dL (calc) (ref 0.2–1.2)
Total Bilirubin: 0.4 mg/dL (ref 0.2–1.2)
Total Protein: 6.7 g/dL (ref 6.1–8.1)

## 2016-11-09 LAB — LIPID PANEL
Cholesterol: 292 mg/dL — ABNORMAL HIGH (ref <200)
HDL: 35 mg/dL — ABNORMAL LOW (ref >40)
Non-HDL Cholesterol (Calc): 257 mg/dL (calc) — ABNORMAL HIGH (ref <130)
Total CHOL/HDL Ratio: 8.3 (calc) — ABNORMAL HIGH (ref <5.0)
Triglycerides: 717 mg/dL — ABNORMAL HIGH (ref <150)

## 2016-11-14 NOTE — Progress Notes (Signed)
Pt aware of lab results & voiced understanding of those results.

## 2016-12-05 ENCOUNTER — Telehealth: Payer: Self-pay | Admitting: *Deleted

## 2016-12-05 NOTE — Telephone Encounter (Signed)
Left a few messages for pt to call & schedule a follow up with [facility] for a DM teaching appointment with no response - left another message today for him to call-sb 12/08/16

## 2017-02-13 ENCOUNTER — Ambulatory Visit: Payer: Self-pay | Admitting: Physician Assistant

## 2017-02-21 NOTE — Progress Notes (Deleted)
FOLLOW UP  Assessment and Plan:   Hypertension Well controlled with current medications  Monitor blood pressure at home; patient to call if consistently greater than 130/80 Continue DASH diet.   Reminder to go to the ER if any CP, SOB, nausea, dizziness, severe HA, changes vision/speech, left arm numbness and tingling and jaw pain.  Cholesterol In review, was supposed to start on fenofibrate for severely elevated triglycerides but has not done so - pending lab results will initiate this medication Continue statin Continue low cholesterol diet and exercise.  Check lipid panel.   Diabetes without complications Newly progressed from prediabetic - If A1C not significantly improved will initiate metformin this visit Continue diet and exercise.  Perform daily foot/skin check, notify office of any concerning changes.  Check A1C  Obesity with co morbidities Long discussion about weight loss, diet, and exercise Discussed ideal weight for height (below ***) and initial weight goal (***) Patient will work on *** Will follow up in ***  Hypothyroidism continue medications the same reminded to take on an empty stomach 30-94mins before food.  check TSH level  Vitamin D Def/ osteoporosis prevention Patient has not been supplementing  Defer check Vit D level  Continue diet and meds as discussed. Further disposition pending results of labs. Discussed med's effects and SE's.   Over 30 minutes of exam, counseling, chart review, and critical decision making was performed.   Future Appointments  Date Time Provider [place]  02/22/2017  9:00 AM Liane Comber, NP [facility] None    ----------------------------------------------------------------------------------------------------------------------  HPI 46 y.o. male  presents for 3 month follow up on hypertension, cholesterol, obesity, vitamin D deficiency, and diabetes newly progressed from prediabetes.    Fenofibrate?  Metformin?   BMI is There is no height or weight on file to calculate BMI., he {HAS HAS YTK:16010} been working on diet and exercise. Wt Readings from Last 3 Encounters:  11/08/16 (!) 303 lb 9.6 oz (137.7 kg)  05/12/16 292 lb (132.5 kg)  04/05/16 289 lb 3.2 oz (131.2 kg)   His blood pressure {HAS HAS NOT:18834} been controlled at home, today their BP is    He {DOES_DOES XNA:35573} workout. He denies chest pain, shortness of breath, dizziness.   He is on cholesterol medication and denies myalgias. His cholesterol is not at goal. The cholesterol last visit was:   Lab Results  Component Value Date   CHOL 292 (H) 11/08/2016   HDL 35 (L) 11/08/2016   LDLCALC 167 (H) 04/06/2016   TRIG 717 (H) 11/08/2016   CHOLHDL 8.3 (H) 11/08/2016    He {Has/has not:18111} been working on diet and exercise for T2 diabetes, and denies {Symptoms; diabetes w/o none:19199}. Last A1C in the office was:  Lab Results  Component Value Date   HGBA1C 8.4 (H) 11/08/2016   He is on thyroid medication. His medication was not changed last visit.   Lab Results  Component Value Date   TSH 1.81 11/08/2016   Patient is not on Vitamin D supplement and was very low at the last visit:    Lab Results  Component Value Date   VD25OH 21 (L) 11/08/2016        Current Medications:  Current Outpatient Medications on File Prior to Visit  Medication Sig  . aspirin 325 MG EC tablet Take 325 mg by mouth daily.  . bisoprolol-hydrochlorothiazide (ZIAC) 5-6.25 MG tablet Take 1 tablet daily for BP  . citalopram (CELEXA) 40 MG tablet Take 1 tablet daily  . levothyroxine (SYNTHROID)  50 MCG tablet Take 1 tablet (50 mcg total) by mouth daily.  . Omega-3 Fatty Acids (FISH OIL) 1000 MG CAPS Take 1 capsule by mouth daily.  . rosuvastatin (CRESTOR) 40 MG tablet Take 1/2 to 1 tablet daily or as directed for Cholesterol   No current facility-administered medications on file prior to visit.      Allergies: No Known Allergies    Medical History:  Past Medical History:  Diagnosis Date  . Hyperlipidemia   . Hypertension   . Morbid obesity ([place])   . Plantar fasciitis   . Prediabetes   . Vitamin D deficiency    Family history- Reviewed and unchanged Social history- Reviewed and unchanged   Review of Systems:  ROS    Physical Exam: There were no vitals taken for this visit. Wt Readings from Last 3 Encounters:  11/08/16 (!) 303 lb 9.6 oz (137.7 kg)  05/12/16 292 lb (132.5 kg)  04/05/16 289 lb 3.2 oz (131.2 kg)   General Appearance: Well nourished, in no apparent distress. Eyes: PERRLA, EOMs, conjunctiva no swelling or erythema Sinuses: No Frontal/maxillary tenderness ENT/Mouth: Ext aud canals clear, TMs without erythema, bulging. No erythema, swelling, or exudate on post pharynx.  Tonsils not swollen or erythematous. Hearing normal.  Neck: Supple, thyroid normal.  Respiratory: Respiratory effort normal, BS equal bilaterally without rales, rhonchi, wheezing or stridor.  Cardio: RRR with no MRGs. Brisk peripheral pulses without edema.  Abdomen: Soft, + BS.  Non tender, no guarding, rebound, hernias, masses. Lymphatics: Non tender without lymphadenopathy.  Musculoskeletal: Full ROM, 5/5 strength, {PSY - GAIT AND STATION:22860} gait Skin: Warm, dry without rashes, lesions, ecchymosis.  Neuro: Cranial nerves intact. No cerebellar symptoms.  Psych: Awake and oriented X 3, normal affect, Insight and Judgment appropriate.    Charles Ribas, NP 7:56 AM [facility]

## 2017-02-22 ENCOUNTER — Ambulatory Visit: Payer: Self-pay | Admitting: Adult Health

## 2017-02-22 NOTE — Progress Notes (Signed)
FOLLOW UP  Assessment and Plan:   Hypertension Has been off of medications - will restart previous and reevaluate  Monitor blood pressure at home; patient to call if consistently greater than 140/80 Continue DASH diet.   Reminder to go to the ER if any CP, SOB, nausea, dizziness, severe HA, changes vision/speech, left arm numbness and tingling and jaw pain.  Cholesterol In review, was supposed to start on fenofibrate for severely elevated triglycerides but has not done so - has been off of all medications - will restart statin, consider fibrate with continued elevated triglycerides Continue low cholesterol diet and exercise.  Check lipid panel.   Diabetes without complications Newly progressed from prediabetic - he reports he has been watching his diet closely - discussed further changes - If A1C not significantly improved will initiate metformin this visit Continue diet and exercise.  Perform daily foot/skin check, notify office of any concerning changes.  Check A1C - pending results will follow up in 4 weeks vs 3 months  Obesity with co morbidities Long discussion about weight loss, diet, and exercise Recommended diet heavy in fruits and veggies and low in animal meats, cheeses, and dairy products, appropriate calorie intake Patient will work on stop liquid calories, start tracking calories and macros in app Discussed appropriate weight for height Follow up at next visit  Hypothyroidism continue medications the same reminded to take on an empty stomach 30-2mins before food.  check TSH level  Anxiety He reports this has improved significantly after his divorce; has not been taking celexa and is doing Stress management techniques discussed, increase water, good sleep hygiene discussed, increase exercise, and increase veggies.   Vitamin D Def/ osteoporosis prevention Patient has not been supplementing  Defer check Vit D level  Continue diet and meds as discussed. Further  disposition pending results of labs. Discussed med's effects and SE's.   Over 30 minutes of exam, counseling, chart review, and critical decision making was performed.   No future appointments.  ----------------------------------------------------------------------------------------------------------------------  HPI 46 y.o. male  presents for 3 month follow up on hypertension, cholesterol, morbid obesity, anxiety vitamin D deficiency, and diabetes newly progressed from prediabetes.  He reports he ran out of medications and is off of all medications today.   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  Wt Readings from Last 3 Encounters:  02/23/17 (!) 305 lb (138.3 kg)  11/08/16 (!) 303 lb 9.6 oz (137.7 kg)  05/12/16 292 lb (132.5 kg)   he has a diagnosis of anxiety and was prescribed celexa; he reports he stopped taking a while back and has noted improved symptoms now 2 years post divorce from his wife.   He has not been checking his blood pressure at home, today their BP is BP: (!) 174/110  He does not workout. He denies chest pain, shortness of breath, dizziness.   He is on cholesterol medication and denies myalgias. His cholesterol is not at goal. The cholesterol last visit was:   Lab Results  Component Value Date   CHOL 292 (H) 11/08/2016   HDL 35 (L) 11/08/2016   LDLCALC 167 (H) 04/06/2016   TRIG 717 (H) 11/08/2016   CHOLHDL 8.3 (H) 11/08/2016    He has been working on diet  for T2 diabetes, and denies increased appetite, nausea, paresthesia of the feet, polydipsia, polyuria, visual disturbances, vomiting and weight loss. Last  A1C in the office was:  Lab Results  Component Value Date   HGBA1C 8.4 (H) 11/08/2016   He is on thyroid medication. His medication was not changed last visit.   Lab Results  Component Value Date   TSH 1.81 11/08/2016   Patient is not on Vitamin D  supplement and was very low at the last visit:    Lab Results  Component Value Date   VD25OH 21 (L) 11/08/2016        Current Medications:  Current Outpatient Medications on File Prior to Visit  Medication Sig  . aspirin 325 MG EC tablet Take 325 mg by mouth as needed.   Marland Kitchen levothyroxine (SYNTHROID) 50 MCG tablet Take 1 tablet (50 mcg total) by mouth daily.  . Omega-3 Fatty Acids (FISH OIL) 1000 MG CAPS Take 1 capsule by mouth daily.  . rosuvastatin (CRESTOR) 40 MG tablet Take 1/2 to 1 tablet daily or as directed for Cholesterol   No current facility-administered medications on file prior to visit.      Allergies: No Known Allergies   Medical History:  Past Medical History:  Diagnosis Date  . Hyperlipidemia   . Hypertension   . Morbid obesity ([place])   . Plantar fasciitis   . Prediabetes   . Vitamin D deficiency    Family history- Reviewed and unchanged Social history- Reviewed and unchanged   Review of Systems:  Review of Systems  Constitutional: Negative for malaise/fatigue and weight loss.  HENT: Negative for hearing loss and tinnitus.   Eyes: Negative for blurred vision and double vision.  Respiratory: Negative for cough, shortness of breath and wheezing.   Cardiovascular: Negative for chest pain, palpitations, orthopnea, claudication and leg swelling.  Gastrointestinal: Negative for abdominal pain, blood in stool, constipation, diarrhea, heartburn, melena, nausea and vomiting.  Genitourinary: Negative.   Musculoskeletal: Positive for joint pain (Foot pain after work). Negative for myalgias.  Skin: Negative for rash.  Neurological: Negative for dizziness, tingling, sensory change, weakness and headaches.  Endo/Heme/Allergies: Negative for polydipsia.  Psychiatric/Behavioral: Negative.  Negative for depression and substance abuse. The patient is not nervous/anxious and does not have insomnia.   All other systems reviewed and are negative.   Physical Exam: BP (!)  174/110   Pulse 87   Temp 97.9 F (36.6 C)   Ht 6' 0.75" (1.848 m)   Wt (!) 305 lb (138.3 kg)   SpO2 97%   BMI 40.52 kg/m  Wt Readings from Last 3 Encounters:  02/23/17 (!) 305 lb (138.3 kg)  11/08/16 (!) 303 lb 9.6 oz (137.7 kg)  05/12/16 292 lb (132.5 kg)   General Appearance: Well nourished, in no apparent distress. Eyes: PERRLA, EOMs, conjunctiva no swelling or erythema Sinuses: No Frontal/maxillary tenderness ENT/Mouth: Ext aud canals clear, TMs without erythema, bulging. No erythema, swelling, or exudate on post pharynx.  Tonsils not swollen or erythematous. Hearing normal.  Neck: Supple, thyroid normal.  Respiratory: Respiratory effort normal, BS equal bilaterally without rales, rhonchi, wheezing or stridor.  Cardio: RRR with no MRGs. Brisk peripheral pulses without edema.  Abdomen: Soft, + BS.  Non tender, no guarding, rebound, hernias, masses. Lymphatics: Non tender without lymphadenopathy.  Musculoskeletal: Full ROM, 5/5 strength, Normal gait Skin: Warm, dry without rashes, lesions, ecchymosis.  Neuro: Cranial nerves intact. No cerebellar symptoms.  Psych: Awake and oriented X 3, normal affect, Insight and Judgment appropriate.    Izora Ribas, NP 1:39 PM [facility]

## 2017-02-23 ENCOUNTER — Ambulatory Visit (INDEPENDENT_AMBULATORY_CARE_PROVIDER_SITE_OTHER): Payer: 59 | Admitting: Adult Health

## 2017-02-23 ENCOUNTER — Encounter: Payer: Self-pay | Admitting: Adult Health

## 2017-02-23 VITALS — BP 174/110 | HR 87 | Temp 97.9°F | Ht 72.75 in | Wt 305.0 lb

## 2017-02-23 DIAGNOSIS — E039 Hypothyroidism, unspecified: Secondary | ICD-10-CM

## 2017-02-23 DIAGNOSIS — F419 Anxiety disorder, unspecified: Secondary | ICD-10-CM | POA: Diagnosis not present

## 2017-02-23 DIAGNOSIS — I10 Essential (primary) hypertension: Secondary | ICD-10-CM | POA: Diagnosis not present

## 2017-02-23 DIAGNOSIS — E119 Type 2 diabetes mellitus without complications: Secondary | ICD-10-CM | POA: Diagnosis not present

## 2017-02-23 DIAGNOSIS — E782 Mixed hyperlipidemia: Secondary | ICD-10-CM

## 2017-02-23 DIAGNOSIS — Z79899 Other long term (current) drug therapy: Secondary | ICD-10-CM

## 2017-02-23 DIAGNOSIS — E559 Vitamin D deficiency, unspecified: Secondary | ICD-10-CM

## 2017-02-23 MED ORDER — BISOPROLOL-HYDROCHLOROTHIAZIDE 5-6.25 MG PO TABS: ORAL_TABLET | ORAL | 1 refills | Status: DC

## 2017-02-23 NOTE — Patient Instructions (Signed)

## 2017-02-24 ENCOUNTER — Other Ambulatory Visit: Payer: Self-pay | Admitting: Adult Health

## 2017-02-24 DIAGNOSIS — E782 Mixed hyperlipidemia: Secondary | ICD-10-CM

## 2017-02-24 DIAGNOSIS — E039 Hypothyroidism, unspecified: Secondary | ICD-10-CM

## 2017-02-24 LAB — HEPATIC FUNCTION PANEL
AG Ratio: 1.4 (calc) (ref 1.0–2.5)
ALT: 35 U/L (ref 9–46)
AST: 21 U/L (ref 10–40)
Albumin: 4 g/dL (ref 3.6–5.1)
Alkaline phosphatase (APISO): 88 U/L (ref 40–115)
Bilirubin, Direct: 0.1 mg/dL (ref 0.0–0.2)
Globulin: 2.9 g/dL (calc) (ref 1.9–3.7)
Indirect Bilirubin: 0.4 mg/dL (calc) (ref 0.2–1.2)
Total Bilirubin: 0.5 mg/dL (ref 0.2–1.2)
Total Protein: 6.9 g/dL (ref 6.1–8.1)

## 2017-02-24 LAB — CBC WITH DIFFERENTIAL/PLATELET
Basophils Absolute: 59 cells/uL (ref 0–200)
Basophils Relative: 0.7 %
Eosinophils Absolute: 260 cells/uL (ref 15–500)
Eosinophils Relative: 3.1 %
HCT: 43.2 % (ref 38.5–50.0)
Hemoglobin: 15.1 g/dL (ref 13.2–17.1)
Lymphs Abs: 2369 cells/uL (ref 850–3900)
MCH: 30.6 pg (ref 27.0–33.0)
MCHC: 35 g/dL (ref 32.0–36.0)
MCV: 87.4 fL (ref 80.0–100.0)
MPV: 10.5 fL (ref 7.5–12.5)
Monocytes Relative: 7.9 %
Neutro Abs: 5048 cells/uL (ref 1500–7800)
Neutrophils Relative %: 60.1 %
Platelets: 260 10*3/uL (ref 140–400)
RBC: 4.94 10*6/uL (ref 4.20–5.80)
RDW: 13.6 % (ref 11.0–15.0)
Total Lymphocyte: 28.2 %
WBC mixed population: 664 cells/uL (ref 200–950)
WBC: 8.4 10*3/uL (ref 3.8–10.8)

## 2017-02-24 LAB — BASIC METABOLIC PANEL WITH GFR
BUN: 15 mg/dL (ref 7–25)
CO2: 29 mmol/L (ref 20–32)
Calcium: 9.4 mg/dL (ref 8.6–10.3)
Chloride: 103 mmol/L (ref 98–110)
Creat: 0.84 mg/dL (ref 0.60–1.35)
GFR, Est African American: 121 mL/min/{1.73_m2} (ref >=60)
GFR, Est Non African American: 104 mL/min/{1.73_m2} (ref >=60)
Glucose, Bld: 171 mg/dL — ABNORMAL HIGH (ref 65–99)
Potassium: 4.9 mmol/L (ref 3.5–5.3)
Sodium: 139 mmol/L (ref 135–146)

## 2017-02-24 LAB — LIPID PANEL
Cholesterol: 280 mg/dL — ABNORMAL HIGH (ref <200)
HDL: 47 mg/dL (ref >40)
LDL Cholesterol (Calc): 182 mg/dL (calc) — ABNORMAL HIGH
Non-HDL Cholesterol (Calc): 233 mg/dL (calc) — ABNORMAL HIGH (ref <130)
Total CHOL/HDL Ratio: 6 (calc) — ABNORMAL HIGH (ref <5.0)
Triglycerides: 303 mg/dL — ABNORMAL HIGH (ref <150)

## 2017-02-24 LAB — HEMOGLOBIN A1C
Hgb A1c MFr Bld: 6.9 % of total Hgb — ABNORMAL HIGH (ref <5.7)
Mean Plasma Glucose: 151 (calc)
eAG (mmol/L): 8.4 (calc)

## 2017-02-24 LAB — TSH: TSH: 2.59 mIU/L (ref 0.40–4.50)

## 2017-02-24 MED ORDER — LEVOTHYROXINE SODIUM 50 MCG PO TABS: 50.0000 ug | ORAL_TABLET | Freq: Every day | ORAL | 1 refills | Status: DC

## 2017-02-24 MED ORDER — ROSUVASTATIN CALCIUM 40 MG PO TABS: ORAL_TABLET | ORAL | 5 refills | Status: DC

## 2017-02-27 ENCOUNTER — Other Ambulatory Visit: Payer: Self-pay | Admitting: Adult Health

## 2017-02-27 ENCOUNTER — Telehealth: Payer: Self-pay

## 2017-02-27 MED ORDER — EZETIMIBE 10 MG PO TABS: 10.0000 mg | ORAL_TABLET | Freq: Every day | ORAL | 1 refills | Status: DC

## 2017-02-27 NOTE — Telephone Encounter (Signed)
Patient called to let him know this is being sent in to the pharmacy

## 2017-03-28 NOTE — Progress Notes (Deleted)
FOLLOW UP  Assessment and Plan:   Hypertension Well controlled with current medications  Monitor blood pressure at home; patient to call if consistently greater than 130/80 Continue DASH diet.   Reminder to go to the ER if any CP, SOB, nausea, dizziness, severe HA, changes vision/speech, left arm numbness and tingling and jaw pain.  Cholesterol Currently at goal;  Continue low cholesterol diet and exercise.  Check lipid panel.   Diabetes without complications Currently pursuing aggressive lifestyle changes with good A1C progress thus far Continue diet and exercise.  Perform daily foot/skin check, notify office of any concerning changes.  Check fructosamine  Obesity with co morbidities Long discussion about weight loss, diet, and exercise Recommended diet heavy in fruits and veggies and low in animal meats, cheeses, and dairy products, appropriate calorie intake Discussed ideal weight for height and initial weight goal (***) Patient will work on *** Will follow up in 3 months  Continue diet and meds as discussed. Further disposition pending results of labs. Discussed med's effects and SE's.   Over 30 minutes of exam, counseling, chart review, and critical decision making was performed.   Future Appointments  Date Time Provider [place]  03/29/2017 11:00 AM Liane Comber, NP [facility] None    ----------------------------------------------------------------------------------------------------------------------  HPI 47 y.o. male  presents for 1 month follow up on hypertension, cholesterol, diabetes and morbid obesity. He has been working aggressively on diet and exercise in an effort to avoid starting diabetic medications. Discussed modified keto diet - animal proteins in moderation, incorporate healthy fats and include fibrous/low starch vegetables  Needs CPE-  BMI is There is no height or weight on file to calculate BMI., he {HAS HAS XBM:84132} been working on  diet and exercise. Wt Readings from Last 3 Encounters:  02/23/17 (!) 305 lb (138.3 kg)  11/08/16 (!) 303 lb 9.6 oz (137.7 kg)  05/12/16 292 lb (132.5 kg)   His blood pressure {HAS HAS NOT:18834} been controlled at home, today their BP is    He {DOES_DOES GMW:10272} workout. He denies chest pain, shortness of breath, dizziness.   He is on cholesterol medication (crestor 40 mg, zetia 10 mg recently started) and denies myalgias. His cholesterol is not at goal. The cholesterol last visit was:   Lab Results  Component Value Date   CHOL 280 (H) 02/23/2017   HDL 47 02/23/2017   LDLCALC 167 (H) 04/06/2016   TRIG 303 (H) 02/23/2017   CHOLHDL 6.0 (H) 02/23/2017    He {Has/has not:18111} been working on diet and exercise for Type 2 diabetes, and denies {Symptoms; diabetes w/o none:19199}. Last A1C in the office was significantly improved from previous A1C of 8.4:  Lab Results  Component Value Date   HGBA1C 6.9 (H) 02/23/2017      Current Medications:  Current Outpatient Medications on File Prior to Visit  Medication Sig  . aspirin 325 MG EC tablet Take 325 mg by mouth as needed.   . bisoprolol-hydrochlorothiazide (ZIAC) 5-6.25 MG tablet Take 1 tablet daily for BP  . ezetimibe (ZETIA) 10 MG tablet Take 1 tablet (10 mg total) by mouth daily.  Marland Kitchen levothyroxine (SYNTHROID) 50 MCG tablet Take 1 tablet (50 mcg total) by mouth daily.  . Omega-3 Fatty Acids (FISH OIL) 1000 MG CAPS Take 1 capsule by mouth daily.  . rosuvastatin (CRESTOR) 40 MG tablet Take 1/2 to 1 tablet daily or as directed for Cholesterol   No current facility-administered medications on file prior to visit.  Allergies: No Known Allergies   Medical History:  Past Medical History:  Diagnosis Date  . Hyperlipidemia   . Hypertension   . Morbid obesity ([place])   . Plantar fasciitis   . Prediabetes   . Vitamin D deficiency    Family history- Reviewed and unchanged Social history- Reviewed and unchanged   Review of  Systems:  ROS    Physical Exam: There were no vitals taken for this visit. Wt Readings from Last 3 Encounters:  02/23/17 (!) 305 lb (138.3 kg)  11/08/16 (!) 303 lb 9.6 oz (137.7 kg)  05/12/16 292 lb (132.5 kg)   General Appearance: Well nourished, in no apparent distress. Eyes: PERRLA, EOMs, conjunctiva no swelling or erythema Sinuses: No Frontal/maxillary tenderness ENT/Mouth: Ext aud canals clear, TMs without erythema, bulging. No erythema, swelling, or exudate on post pharynx.  Tonsils not swollen or erythematous. Hearing normal.  Neck: Supple, thyroid normal.  Respiratory: Respiratory effort normal, BS equal bilaterally without rales, rhonchi, wheezing or stridor.  Cardio: RRR with no MRGs. Brisk peripheral pulses without edema.  Abdomen: Soft, + BS.  Non tender, no guarding, rebound, hernias, masses. Lymphatics: Non tender without lymphadenopathy.  Musculoskeletal: Full ROM, 5/5 strength, {PSY - GAIT AND STATION:22860} gait Skin: Warm, dry without rashes, lesions, ecchymosis.  Neuro: Cranial nerves intact. No cerebellar symptoms.  Psych: Awake and oriented X 3, normal affect, Insight and Judgment appropriate.    Izora Ribas, NP 12:58 PM [facility]

## 2017-03-29 ENCOUNTER — Ambulatory Visit: Payer: Self-pay | Admitting: Adult Health

## 2017-09-17 ENCOUNTER — Encounter: Payer: Self-pay | Admitting: Adult Health

## 2017-09-17 ENCOUNTER — Ambulatory Visit (INDEPENDENT_AMBULATORY_CARE_PROVIDER_SITE_OTHER): Payer: BLUE CROSS/BLUE SHIELD | Admitting: Adult Health

## 2017-09-17 VITALS — BP 152/96 | HR 88 | Temp 97.3°F | Ht 72.75 in | Wt 302.0 lb

## 2017-09-17 DIAGNOSIS — E559 Vitamin D deficiency, unspecified: Secondary | ICD-10-CM | POA: Diagnosis not present

## 2017-09-17 DIAGNOSIS — I10 Essential (primary) hypertension: Secondary | ICD-10-CM | POA: Diagnosis not present

## 2017-09-17 DIAGNOSIS — E039 Hypothyroidism, unspecified: Secondary | ICD-10-CM

## 2017-09-17 DIAGNOSIS — Z79899 Other long term (current) drug therapy: Secondary | ICD-10-CM

## 2017-09-17 DIAGNOSIS — E782 Mixed hyperlipidemia: Secondary | ICD-10-CM

## 2017-09-17 DIAGNOSIS — E119 Type 2 diabetes mellitus without complications: Secondary | ICD-10-CM

## 2017-09-17 MED ORDER — BISOPROLOL-HYDROCHLOROTHIAZIDE 5-6.25 MG PO TABS: ORAL_TABLET | ORAL | 1 refills | Status: DC

## 2017-09-17 MED ORDER — EZETIMIBE 10 MG PO TABS: 10.0000 mg | ORAL_TABLET | Freq: Every day | ORAL | 1 refills | Status: DC

## 2017-09-17 MED ORDER — PRAVASTATIN SODIUM 40 MG PO TABS: ORAL_TABLET | ORAL | 1 refills | Status: DC

## 2017-09-17 NOTE — Patient Instructions (Addendum)
Start pravastatin 1/2 tab (20 mg) twice a week at night - slowly increase up to take daily if tolerated   Find alternative to sugar soda - goal to cut out completely in the next 2 months        Aim for 7+ servings of fruits and vegetables daily  80+ fluid ounces of water or unsweet tea for healthy kidneys  Limit animal fats in diet for cholesterol and heart health - choose grass fed whenever available  Aim for low stress - take time to unwind and care for your mental health  Aim for 150 min of moderate intensity exercise weekly for heart health, and weights twice weekly for bone health  Aim for 7-9 hours of sleep daily

## 2017-09-17 NOTE — Progress Notes (Signed)
FOLLOW UP  Assessment and Plan:   Hypertension Has been off of medications - will restart previous and reevaluate  Monitor blood pressure at home; patient to call if consistently greater than 140/80 Continue DASH diet.   Reminder to go to the ER if any CP, SOB, nausea, dizziness, severe HA, changes vision/speech, left arm numbness and tingling and jaw pain.   Cholesterol Severely elevated; cannot tolerate atorvastatin, crestor - restart zetia, will try low dose pravastatin Continue low cholesterol diet and exercise.  Check lipid panel.   Diabetes without complications Newly progressed from prediabetic - he reports he has been watching his diet closely - discussed further changes - If A1C not significantly improved will initiate metformin this visit Continue diet and exercise.  Perform daily foot/skin check, notify office of any concerning changes.  Check A1C   Obesity with co morbidities Long discussion about weight loss, diet, and exercise Recommended diet heavy in fruits and veggies and low in animal meats, cheeses, and dairy products, appropriate calorie intake Patient will work on stop liquid calories-  Discussed appropriate weight for height Follow up at next visit  Hypothyroidism continue medications the same reminded to take on an empty stomach 30-56mins before food.  check TSH level  Vitamin D Def/ osteoporosis prevention Patient has not been supplementing  Defer check Vit D level  Continue diet and meds as discussed. Further disposition pending results of labs. Discussed med's effects and SE's.   Over 30 minutes of exam, counseling, chart review, and critical decision making was performed.   No future appointments.  ----------------------------------------------------------------------------------------------------------------------  HPI 47 y.o. male  presents for 6 month follow up on hypertension, cholesterol, morbid obesity, anxiety vitamin D deficiency, and  diabetes newly progressed from prediabetes.   He reports he ran out of medications and is off of all medications today, has been out for 2-3 weeks.   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  Wt Readings from Last 3 Encounters:  09/17/17 (!) 302 lb (137 kg)  02/23/17 (!) 305 lb (138.3 kg)  11/08/16 (!) 303 lb 9.6 oz (137.7 kg)   He has been checking his blood pressure at home (has been 130-150/80-90 since he is off of medication), today their BP is BP: (!) 152/96  He does not workout. He denies chest pain, shortness of breath, dizziness.   He is not currently on cholesterol medication (had myalgias with atorvastatin and rosuvastatin) and denies myalgias. His cholesterol is not at goal. The cholesterol last visit was:   Lab Results  Component Value Date   CHOL 280 (H) 02/23/2017   HDL 47 02/23/2017   LDLCALC 182 (H) 02/23/2017   TRIG 303 (H) 02/23/2017   CHOLHDL 6.0 (H) 02/23/2017    He has been working on diet  for T2 diabetes, and denies increased appetite, nausea, paresthesia of the feet, polydipsia, polyuria, visual disturbances, vomiting and weight loss. Last A1C in the office was:  Lab Results  Component Value Date   HGBA1C 6.9 (H) 02/23/2017   He is on thyroid medication. His medication was not changed last visit.   Lab Results  Component Value Date   TSH 2.59 02/23/2017   Patient is not on Vitamin D supplement and was very low at the last visit:    Lab Results  Component Value Date   VD25OH 21 (L) 11/08/2016  Current Medications:  Current Outpatient Medications on File Prior to Visit  Medication Sig  . aspirin 325 MG EC tablet Take 325 mg by mouth as needed.   Marland Kitchen levothyroxine (SYNTHROID) 50 MCG tablet Take 1 tablet (50 mcg total) by mouth daily. (Patient not taking: Reported on 09/17/2017)  . Omega-3 Fatty Acids (FISH OIL) 1000 MG CAPS Take 1 capsule by mouth  daily.   No current facility-administered medications on file prior to visit.      Allergies: No Known Allergies   Medical History:  Past Medical History:  Diagnosis Date  . Hyperlipidemia   . Hypertension   . Morbid obesity ([place])   . Plantar fasciitis   . Prediabetes   . Vitamin D deficiency    Family history- Reviewed and unchanged Social history- Reviewed and unchanged   Review of Systems:  Review of Systems  Constitutional: Negative for malaise/fatigue and weight loss.  HENT: Negative for hearing loss and tinnitus.   Eyes: Negative for blurred vision and double vision.  Respiratory: Negative for cough, shortness of breath and wheezing.   Cardiovascular: Negative for chest pain, palpitations, orthopnea, claudication and leg swelling.  Gastrointestinal: Negative for abdominal pain, blood in stool, constipation, diarrhea, heartburn, melena, nausea and vomiting.  Genitourinary: Negative.   Musculoskeletal: Positive for joint pain (Foot pain after work). Negative for myalgias.  Skin: Negative for rash.  Neurological: Negative for dizziness, tingling, sensory change, weakness and headaches.  Endo/Heme/Allergies: Negative for polydipsia.  Psychiatric/Behavioral: Negative.  Negative for depression and substance abuse. The patient is not nervous/anxious and does not have insomnia.   All other systems reviewed and are negative.   Physical Exam: BP (!) 152/96   Pulse 88   Temp (!) 97.3 F (36.3 C)   Ht 6' 0.75" (1.848 m)   Wt (!) 302 lb (137 kg)   SpO2 95%   BMI 40.12 kg/m  Wt Readings from Last 3 Encounters:  09/17/17 (!) 302 lb (137 kg)  02/23/17 (!) 305 lb (138.3 kg)  11/08/16 (!) 303 lb 9.6 oz (137.7 kg)   General Appearance: Well nourished, in no apparent distress. Eyes: PERRLA, EOMs, conjunctiva no swelling or erythema Sinuses: No Frontal/maxillary tenderness ENT/Mouth: Ext aud canals clear, TMs without erythema, bulging. No erythema, swelling, or exudate on  post pharynx.  Tonsils not swollen or erythematous. Hearing normal.  Neck: Supple, thyroid normal.  Respiratory: Respiratory effort normal, BS equal bilaterally without rales, rhonchi, wheezing or stridor.  Cardio: RRR with no MRGs. Brisk peripheral pulses without edema.  Abdomen: Soft, + BS.  Non tender, no guarding, rebound, hernias, masses. Lymphatics: Non tender without lymphadenopathy.  Musculoskeletal: Full ROM, 5/5 strength, Normal gait Skin: Warm, dry without rashes, lesions, ecchymosis.  Neuro: Cranial nerves intact. No cerebellar symptoms.  Psych: Awake and oriented X 3, normal affect, Insight and Judgment appropriate.    Izora Ribas, NP 4:55 PM [facility]

## 2017-09-18 ENCOUNTER — Other Ambulatory Visit: Payer: Self-pay | Admitting: Adult Health

## 2017-09-18 LAB — CBC WITH DIFFERENTIAL/PLATELET
Basophils Absolute: 53 cells/uL (ref 0–200)
Basophils Relative: 0.6 %
Eosinophils Absolute: 290 cells/uL (ref 15–500)
Eosinophils Relative: 3.3 %
HCT: 44.6 % (ref 38.5–50.0)
Hemoglobin: 15.1 g/dL (ref 13.2–17.1)
Lymphs Abs: 2534 cells/uL (ref 850–3900)
MCH: 29.6 pg (ref 27.0–33.0)
MCHC: 33.9 g/dL (ref 32.0–36.0)
MCV: 87.5 fL (ref 80.0–100.0)
MPV: 11 fL (ref 7.5–12.5)
Monocytes Relative: 8.8 %
Neutro Abs: 5148 cells/uL (ref 1500–7800)
Neutrophils Relative %: 58.5 %
Platelets: 301 10*3/uL (ref 140–400)
RBC: 5.1 10*6/uL (ref 4.20–5.80)
RDW: 13.6 % (ref 11.0–15.0)
Total Lymphocyte: 28.8 %
WBC mixed population: 774 cells/uL (ref 200–950)
WBC: 8.8 10*3/uL (ref 3.8–10.8)

## 2017-09-18 LAB — LIPID PANEL
Cholesterol: 282 mg/dL — ABNORMAL HIGH (ref <200)
HDL: 41 mg/dL (ref >40)
Non-HDL Cholesterol (Calc): 241 mg/dL (calc) — ABNORMAL HIGH (ref <130)
Total CHOL/HDL Ratio: 6.9 (calc) — ABNORMAL HIGH (ref <5.0)
Triglycerides: 661 mg/dL — ABNORMAL HIGH (ref <150)

## 2017-09-18 LAB — HEMOGLOBIN A1C
Hgb A1c MFr Bld: 6.6 % of total Hgb — ABNORMAL HIGH (ref <5.7)
Mean Plasma Glucose: 143 (calc)
eAG (mmol/L): 7.9 (calc)

## 2017-09-18 LAB — COMPLETE METABOLIC PANEL WITH GFR
AG Ratio: 1.2 (calc) (ref 1.0–2.5)
ALT: 22 U/L (ref 9–46)
AST: 16 U/L (ref 10–40)
Albumin: 3.7 g/dL (ref 3.6–5.1)
Alkaline phosphatase (APISO): 89 U/L (ref 40–115)
BUN: 12 mg/dL (ref 7–25)
CO2: 28 mmol/L (ref 20–32)
Calcium: 9.3 mg/dL (ref 8.6–10.3)
Chloride: 106 mmol/L (ref 98–110)
Creat: 0.83 mg/dL (ref 0.60–1.35)
GFR, Est African American: 121 mL/min/{1.73_m2} (ref >=60)
GFR, Est Non African American: 104 mL/min/{1.73_m2} (ref >=60)
Globulin: 3.1 g/dL (calc) (ref 1.9–3.7)
Glucose, Bld: 166 mg/dL — ABNORMAL HIGH (ref 65–99)
Potassium: 4.5 mmol/L (ref 3.5–5.3)
Sodium: 142 mmol/L (ref 135–146)
Total Bilirubin: 0.3 mg/dL (ref 0.2–1.2)
Total Protein: 6.8 g/dL (ref 6.1–8.1)

## 2017-09-18 LAB — TSH: TSH: 2.69 mIU/L (ref 0.40–4.50)

## 2017-10-20 ENCOUNTER — Other Ambulatory Visit: Payer: Self-pay | Admitting: Adult Health

## 2017-12-31 ENCOUNTER — Ambulatory Visit: Payer: Self-pay | Admitting: Internal Medicine

## 2017-12-31 NOTE — Patient Instructions (Signed)

## 2017-12-31 NOTE — Progress Notes (Signed)
RESCHEDULED

## 2018-02-06 ENCOUNTER — Ambulatory Visit: Payer: Managed Care, Other (non HMO) | Admitting: Internal Medicine

## 2018-02-06 ENCOUNTER — Encounter: Payer: Self-pay | Admitting: Internal Medicine

## 2018-02-06 VITALS — BP 162/94 | HR 96 | Temp 97.5°F | Resp 18 | Ht 72.5 in | Wt 303.8 lb

## 2018-02-06 DIAGNOSIS — Z79899 Other long term (current) drug therapy: Secondary | ICD-10-CM | POA: Diagnosis not present

## 2018-02-06 DIAGNOSIS — Z13220 Encounter for screening for lipoid disorders: Secondary | ICD-10-CM

## 2018-02-06 DIAGNOSIS — Z1212 Encounter for screening for malignant neoplasm of rectum: Secondary | ICD-10-CM

## 2018-02-06 DIAGNOSIS — Z130 Encounter for screening for diseases of the blood and blood-forming organs and certain disorders involving the immune mechanism: Secondary | ICD-10-CM

## 2018-02-06 DIAGNOSIS — Z1389 Encounter for screening for other disorder: Secondary | ICD-10-CM

## 2018-02-06 DIAGNOSIS — E559 Vitamin D deficiency, unspecified: Secondary | ICD-10-CM

## 2018-02-06 DIAGNOSIS — Z6841 Body Mass Index (BMI) 40.0 and over, adult: Secondary | ICD-10-CM

## 2018-02-06 DIAGNOSIS — Z1329 Encounter for screening for other suspected endocrine disorder: Secondary | ICD-10-CM

## 2018-02-06 DIAGNOSIS — E782 Mixed hyperlipidemia: Secondary | ICD-10-CM

## 2018-02-06 DIAGNOSIS — Z0000 Encounter for general adult medical examination without abnormal findings: Secondary | ICD-10-CM | POA: Diagnosis not present

## 2018-02-06 DIAGNOSIS — Z23 Encounter for immunization: Secondary | ICD-10-CM

## 2018-02-06 DIAGNOSIS — Z131 Encounter for screening for diabetes mellitus: Secondary | ICD-10-CM

## 2018-02-06 DIAGNOSIS — Z125 Encounter for screening for malignant neoplasm of prostate: Secondary | ICD-10-CM

## 2018-02-06 DIAGNOSIS — Z136 Encounter for screening for cardiovascular disorders: Secondary | ICD-10-CM

## 2018-02-06 DIAGNOSIS — R350 Frequency of micturition: Secondary | ICD-10-CM

## 2018-02-06 DIAGNOSIS — I10 Essential (primary) hypertension: Secondary | ICD-10-CM

## 2018-02-06 DIAGNOSIS — Z1211 Encounter for screening for malignant neoplasm of colon: Secondary | ICD-10-CM

## 2018-02-06 DIAGNOSIS — Z8249 Family history of ischemic heart disease and other diseases of the circulatory system: Secondary | ICD-10-CM

## 2018-02-06 DIAGNOSIS — N401 Enlarged prostate with lower urinary tract symptoms: Secondary | ICD-10-CM | POA: Diagnosis not present

## 2018-02-06 DIAGNOSIS — Z87891 Personal history of nicotine dependence: Secondary | ICD-10-CM

## 2018-02-06 DIAGNOSIS — E039 Hypothyroidism, unspecified: Secondary | ICD-10-CM

## 2018-02-06 DIAGNOSIS — Z0001 Encounter for general adult medical examination with abnormal findings: Secondary | ICD-10-CM

## 2018-02-06 DIAGNOSIS — E119 Type 2 diabetes mellitus without complications: Secondary | ICD-10-CM

## 2018-02-06 DIAGNOSIS — Z111 Encounter for screening for respiratory tuberculosis: Secondary | ICD-10-CM | POA: Diagnosis not present

## 2018-02-06 DIAGNOSIS — R5383 Other fatigue: Secondary | ICD-10-CM

## 2018-02-06 MED ORDER — OLMESARTAN MEDOXOMIL 40 MG PO TABS: ORAL_TABLET | ORAL | 1 refills | Status: DC

## 2018-02-06 MED ORDER — PHENTERMINE HCL 37.5 MG PO TABS: ORAL_TABLET | ORAL | 5 refills | Status: DC

## 2018-02-06 MED ORDER — TOPIRAMATE 50 MG PO TABS: ORAL_TABLET | ORAL | 1 refills | Status: DC

## 2018-02-06 NOTE — Progress Notes (Signed)
[facility]   Unk Pinto, M.D.     Uvaldo Bristle. Silverio Lay, P.A.-C Liane Comber, [place]                [address] [place], [place] [address] Telephone [phone] Telefax [phone] Annual  Screening/Preventative Visit  & Comprehensive Evaluation & Examination     This very nice 47 y.o.  DWM  presents for a Screening /Preventative Visit & comprehensive evaluation and management of multiple medical co-morbidities.  Patient has been followed for HTN, HLD, T2_NIDDM  and Vitamin D Deficiency.     Patient has been off of his BP meds. Patient's BP has been controlled at home.  Today's BP is elevated at 162/94.  Patient denies any cardiac symptoms as chest pain, palpitations, shortness of breath, dizziness or ankle swelling.     Patient has also been off of his Lipid meds and his lipids are not controlled with his non-compliant diet. Patient denies myalgias or other medication SE's. Lab Results  Component Value Date   CHOL 282 (H) 09/17/2017   HDL 41 09/17/2017   LDLCALC not calculated 09/17/2017   TRIG 661 (H) 09/17/2017   CHOLHDL 6.9 (H) 09/17/2017      Patient has hx/o T2_NIDDM (A1c 8.4% / Aug 2018)  and patient denies reactive hypoglycemic symptoms, visual blurring, diabetic polys or paresthesias. Last A1c was not at goal: Lab Results  Component Value Date   HGBA1C 6.6 (H) 09/17/2017       Finally, patient has history of Vitamin D Deficiency and last vitamin D was not at goal: Lab Results  Component Value Date   VD25OH 21 (L) 11/08/2016   Current Outpatient Medications on File Prior to Visit  Medication Sig  . bisoprolol-hydrochlorothiazide (ZIAC) 5-6.25 MG tablet Take 1 tablet daily for BP  . pravastatin (PRAVACHOL) 40 MG tablet TAKE 1/2-1 TABLET BY MOUTH EVERY NIGHT  . ezetimibe (ZETIA) 10 MG tablet Take 1 tablet (10 mg total) by mouth daily. (Patient not taking: Reported on  02/06/2018)   No current facility-administered medications on file prior to visit.    No Known Allergies   Past Medical History:  Diagnosis Date  . Hyperlipidemia   . Hypertension   . Morbid obesity ([place])   . Plantar fasciitis   . Prediabetes   . Vitamin D deficiency    Health Maintenance  Topic Date Due  . FOOT EXAM  06/08/1979  . OPHTHALMOLOGY EXAM  06/08/1979  . HIV Screening  06/07/1984  . INFLUENZA VACCINE  10/11/2017  . PNEUMOCOCCAL POLYSACCHARIDE VACCINE AGE 71-64 HIGH RISK  02/07/2019 (Originally 06/08/1971)  . HEMOGLOBIN A1C  03/20/2018  . TETANUS/TDAP  03/13/2022   Immunization History  Administered Date(s) Administered  . Influenza Inj Mdck Quad With Preservative 02/06/2018  . PPD Test 02/06/2018  . Tdap 03/13/2012   History reviewed. No pertinent surgical history.   Family History  Problem Relation Age of Onset  . Hypertension Mother   . Hyperlipidemia Mother   . Heart disease Father   . Hyperlipidemia Father   . Hypertension Father   . Early death Father   . Diabetes Father   . COPD Father    Social History   Socioeconomic History  . Marital status: Divorced  . Number of children: 2 children  Occupational History  . Not on file  Tobacco Use  . Smoking status:  Former Smoker    Last attempt to quit: 05/13/2011    Years since quitting: 6.7  . Smokeless tobacco: Former Network engineer and Sexual Activity  . Alcohol use: Yes    Alcohol/week: 4.0 standard drinks    Types: 4 Glasses of wine per week  . Drug use: Not on file  . Sexual activity: Yes    ROS Constitutional: Denies fever, chills, weight loss/gain, headaches, insomnia,  night sweats or change in appetite. Does c/o fatigue. Eyes: Denies redness, blurred vision, diplopia, discharge, itchy or watery eyes.  ENT: Denies discharge, congestion, post nasal drip, epistaxis, sore throat, earache, hearing loss, dental pain, Tinnitus, Vertigo, Sinus pain or snoring.  Cardio: Denies chest pain,  palpitations, irregular heartbeat, syncope, dyspnea, diaphoresis, orthopnea, PND, claudication or edema Respiratory: denies cough, dyspnea, DOE, pleurisy, hoarseness, laryngitis or wheezing.  Gastrointestinal: Denies dysphagia, heartburn, reflux, water brash, pain, cramps, nausea, vomiting, bloating, diarrhea, constipation, hematemesis, melena, hematochezia, jaundice or hemorrhoids Genitourinary: Denies dysuria, frequency, urgency, nocturia, hesitancy, discharge, hematuria or flank pain Musculoskeletal: Denies arthralgia, myalgia, stiffness, Jt. Swelling, pain, limp or strain/sprain. Denies Falls. Skin: Denies puritis, rash, hives, warts, acne, eczema or change in skin lesion Neuro: No weakness, tremor, incoordination, spasms, paresthesia or pain Psychiatric: Denies confusion, memory loss or sensory loss. Denies Depression. Endocrine: Denies change in weight, skin, hair change, nocturia, and paresthesia, diabetic polys, visual blurring or hyper / hypo glycemic episodes.  Heme/Lymph: No excessive bleeding, bruising or enlarged lymph nodes.  Physical Exam  BP (!) 162/94   Pulse 96   Temp (!) 97.5 F (36.4 C)   Resp 18   Ht 6' 0.5" (1.842 m)   Wt (!) 303 lb 12.8 oz (137.8 kg)   BMI 40.64 kg/m   General Appearance: Over  nourished and well groomed and in no apparent distress.  Eyes: PERRLA, EOMs, conjunctiva no swelling or erythema, normal fundi and vessels. Sinuses: No frontal/maxillary tenderness ENT/Mouth: EACs patent / TMs  nl. Nares clear without erythema, swelling, mucoid exudates. Oral hygiene is good. No erythema, swelling, or exudate. Tongue normal, non-obstructing. Tonsils not swollen or erythematous. Hearing normal.  Neck: Supple, thyroid not palpable. No bruits, nodes or JVD. Respiratory: Respiratory effort normal.  BS equal and clear bilateral without rales, rhonci, wheezing or stridor. Cardio: Heart sounds are normal with regular rate and rhythm and no murmurs, rubs or  gallops. Peripheral pulses are normal and equal bilaterally without edema. No aortic or femoral bruits. Chest: symmetric with normal excursions and percussion.  Abdomen: Soft, with Nl bowel sounds. Nontender, no guarding, rebound, hernias, masses, or organomegaly.  Lymphatics: Non tender without lymphadenopathy.  Genitourinary: No hernias.Testes nl. DRE - prostate nl for age - smooth & firm w/o nodules. Musculoskeletal: Full ROM all peripheral extremities, joint stability, 5/5 strength, and normal gait. Skin: Warm and dry without rashes, lesions, cyanosis, clubbing or  ecchymosis.  Neuro: Cranial nerves intact, reflexes equal bilaterally. Normal muscle tone, no cerebellar symptoms. Sensation intact.  Pysch: Alert and oriented X 3 with normal affect, insight and judgment appropriate.   Assessment and Plan  1. Annual Preventative/Screening Exam   2. Essential hypertension - EKG 12-Lead - Urinalysis, Routine w reflex microscopic - Microalbumin / creatinine urine ratio - CBC with Differential/Platelet - COMPLETE METABOLIC PANEL WITH GFR - Magnesium - TSH - olmesartan (BENICAR) 40 MG tablet; Take 1/2 to 1 tablet at night for BP  Dispense: 90 tablet; Refill: 1  3. Hyperlipidemia, mixed  - EKG 12-Lead - Lipid panel - TSH  4. Type 2 diabetes mellitus without complication, without long-term current use of insulin ([facility])  - EKG 12-Lead - Hemoglobin A1c - Insulin, random  5. Vitamin D deficiency  - VITAMIN D 25 Hydroxyl  6. Hypothyroidism - TSH  7. Class 3 severe obesity due to excess calories with serious comorbidity and body mass index (BMI) of 40.0 to 44.9 in adult ([place])   8. Screening for colorectal cancer  - POC Hemoccult Bld/Stl (3-Cd Home Screen); Future  9. Screening-pulmonary TB  - TB Skin Test  10. Prostate cancer screening  - PSA  11. Need for prophylactic vaccination and inoculation against influenza - FLU VACCINE MDCK QUAD W/Preservative  12. Screening  for ischemic heart disease  - EKG 12-Lead  13. FHx: heart disease  - EKG 12-Lead  14. Former smoker  - EKG 12-Lead  15. Fatigue, unspecified type  - Iron,Total/Total Iron Binding Cap - Vitamin B12 - Testosterone  16. Medication management  - Urinalysis, Routine w reflex microscopic - Microalbumin / creatinine urine ratio - CBC with Differential/Platelet - COMPLETE METABOLIC PANEL WITH GFR - Magnesium - Lipid panel - TSH - Hemoglobin A1c - Insulin, random - VITAMIN D 25 Hydroxyl              Patient was counseled in prudent diet, weight control to achieve/maintain BMI less than 25, BP monitoring, regular exercise and medications as discussed.  Discussed med effects and SE's. Routine screening labs and tests as requested with regular follow-up as recommended. Over 40 minutes of exam, counseling, chart review and high complex critical decision making was performed

## 2018-02-06 NOTE — Patient Instructions (Signed)

## 2018-02-08 ENCOUNTER — Other Ambulatory Visit: Payer: Self-pay | Admitting: Internal Medicine

## 2018-02-08 DIAGNOSIS — E119 Type 2 diabetes mellitus without complications: Secondary | ICD-10-CM

## 2018-02-08 DIAGNOSIS — E782 Mixed hyperlipidemia: Secondary | ICD-10-CM

## 2018-02-08 LAB — COMPLETE METABOLIC PANEL WITH GFR
AG Ratio: 1.2 (calc) (ref 1.0–2.5)
ALT: 27 U/L (ref 9–46)
AST: 18 U/L (ref 10–40)
Albumin: 3.7 g/dL (ref 3.6–5.1)
Alkaline phosphatase (APISO): 96 U/L (ref 40–115)
BUN: 10 mg/dL (ref 7–25)
CO2: 26 mmol/L (ref 20–32)
Calcium: 9.6 mg/dL (ref 8.6–10.3)
Chloride: 101 mmol/L (ref 98–110)
Creat: 0.89 mg/dL (ref 0.60–1.35)
GFR, Est African American: 117 mL/min/{1.73_m2} (ref >=60)
GFR, Est Non African American: 101 mL/min/{1.73_m2} (ref >=60)
Globulin: 3.2 g/dL (calc) (ref 1.9–3.7)
Glucose, Bld: 89 mg/dL (ref 65–99)
Potassium: 4.1 mmol/L (ref 3.5–5.3)
Sodium: 138 mmol/L (ref 135–146)
Total Bilirubin: 0.4 mg/dL (ref 0.2–1.2)
Total Protein: 6.9 g/dL (ref 6.1–8.1)

## 2018-02-08 LAB — CBC WITH DIFFERENTIAL/PLATELET
Basophils Absolute: 58 cells/uL (ref 0–200)
Basophils Relative: 0.5 %
Eosinophils Absolute: 151 cells/uL (ref 15–500)
Eosinophils Relative: 1.3 %
HCT: 46.5 % (ref 38.5–50.0)
Hemoglobin: 16 g/dL (ref 13.2–17.1)
Lymphs Abs: 3306 cells/uL (ref 850–3900)
MCH: 29.7 pg (ref 27.0–33.0)
MCHC: 34.4 g/dL (ref 32.0–36.0)
MCV: 86.4 fL (ref 80.0–100.0)
MPV: 10.4 fL (ref 7.5–12.5)
Monocytes Relative: 7.4 %
Neutro Abs: 7227 cells/uL (ref 1500–7800)
Neutrophils Relative %: 62.3 %
Platelets: 346 10*3/uL (ref 140–400)
RBC: 5.38 10*6/uL (ref 4.20–5.80)
RDW: 14.3 % (ref 11.0–15.0)
Total Lymphocyte: 28.5 %
WBC mixed population: 858 cells/uL (ref 200–950)
WBC: 11.6 10*3/uL — ABNORMAL HIGH (ref 3.8–10.8)

## 2018-02-08 LAB — INSULIN, RANDOM: Insulin: 32.9 u[IU]/mL — ABNORMAL HIGH (ref 2.0–19.6)

## 2018-02-08 LAB — IRON, TOTAL/TOTAL IRON BINDING CAP
%SAT: 20 % (calc) (ref 20–48)
Iron: 62 ug/dL (ref 50–180)
TIBC: 317 mcg/dL (calc) (ref 250–425)

## 2018-02-08 LAB — LIPID PANEL
Cholesterol: 344 mg/dL — ABNORMAL HIGH (ref <200)
HDL: 44 mg/dL (ref >40)
Non-HDL Cholesterol (Calc): 300 mg/dL (calc) — ABNORMAL HIGH (ref <130)
Total CHOL/HDL Ratio: 7.8 (calc) — ABNORMAL HIGH (ref <5.0)
Triglycerides: 568 mg/dL — ABNORMAL HIGH (ref <150)

## 2018-02-08 LAB — HEMOGLOBIN A1C
Hgb A1c MFr Bld: 7.3 % of total Hgb — ABNORMAL HIGH (ref <5.7)
Mean Plasma Glucose: 163 (calc)
eAG (mmol/L): 9 (calc)

## 2018-02-08 LAB — MAGNESIUM: Magnesium: 1.6 mg/dL (ref 1.5–2.5)

## 2018-02-08 LAB — VITAMIN D 25 HYDROXY (VIT D DEFICIENCY, FRACTURES): Vit D, 25-Hydroxy: 15 ng/mL — ABNORMAL LOW (ref 30–100)

## 2018-02-08 LAB — PSA: PSA: 0.4 ng/mL (ref <=4.0)

## 2018-02-08 LAB — TSH: TSH: 3.92 mIU/L (ref 0.40–4.50)

## 2018-02-08 LAB — TESTOSTERONE: Testosterone: 206 ng/dL — ABNORMAL LOW (ref 250–827)

## 2018-02-08 LAB — VITAMIN B12: Vitamin B-12: 528 pg/mL (ref 200–1100)

## 2018-02-08 MED ORDER — METFORMIN HCL ER 500 MG PO TB24: ORAL_TABLET | ORAL | 3 refills | Status: DC

## 2018-02-08 MED ORDER — ROSUVASTATIN CALCIUM 40 MG PO TABS: ORAL_TABLET | ORAL | 1 refills | Status: DC

## 2018-02-09 ENCOUNTER — Encounter: Payer: Self-pay | Admitting: Internal Medicine

## 2018-02-11 ENCOUNTER — Encounter: Payer: Self-pay | Admitting: *Deleted

## 2018-02-13 LAB — TB SKIN TEST
Induration: 0 mm
TB Skin Test: NEGATIVE

## 2018-03-19 ENCOUNTER — Ambulatory Visit (INDEPENDENT_AMBULATORY_CARE_PROVIDER_SITE_OTHER): Payer: Managed Care, Other (non HMO) | Admitting: Internal Medicine

## 2018-03-19 VITALS — BP 125/70 | HR 80 | Temp 97.5°F | Resp 18 | Ht 72.5 in | Wt 298.0 lb

## 2018-03-19 DIAGNOSIS — E782 Mixed hyperlipidemia: Secondary | ICD-10-CM | POA: Diagnosis not present

## 2018-03-19 DIAGNOSIS — I10 Essential (primary) hypertension: Secondary | ICD-10-CM | POA: Diagnosis not present

## 2018-03-19 DIAGNOSIS — E119 Type 2 diabetes mellitus without complications: Secondary | ICD-10-CM | POA: Diagnosis not present

## 2018-03-19 DIAGNOSIS — Z6841 Body Mass Index (BMI) 40.0 and over, adult: Secondary | ICD-10-CM

## 2018-03-19 NOTE — Progress Notes (Signed)
   Subjective:    Patient ID: Charles Harrell, male    DOB: 11-10-1970, 48 y.o.   MRN: [id number]  HPI   Patient is a very nice 48 yo DWM returning for 1 month f/u after adding Benicar for elevated BP. He reports infreq BP's checks have been normal,. He denies any c/o HA's, dizziness, CP, palpitations, dyspnea or edema. He has lost 5 # over the last month since starting Phentermine & Topamax. His recent labs showed severe HLD (Chol 344 / TG 568) & he was switched from Pravastatin to Rosuvastatin. A1c had gone up from 6.6% to 7.3% and he was started on Metformin.   Medication Sig  . bisoprolol-hydrochlorothiazide (ZIAC) 5-6.25 MG tablet Take 1 tablet daily for BP  . metFORMIN (GLUCOPHAGE-XR) 500 MG 24 hr tablet Take 2 tablets 2 x/ day with meals  for Diabetes  . olmesartan (BENICAR) 40 MG tablet Take 1/2 to 1 tablet at night for BP  . phentermine (ADIPEX-P) 37.5 MG tablet Take 1/2 to 1 tablet every morning for dieting & weight  loss  . rosuvastatin (CRESTOR) 40 MG tablet Take  1 tablet daily  for Cholesterol  . topiramate (TOPAMAX) 50 MG tablet Take 1 tablet 2 x /day at suppertime & bedtime  for dieting & weight loss   No Known Allergies   Past Medical History:  Diagnosis Date  . Hyperlipidemia   . Hypertension   . Morbid obesity ([place])   . Plantar fasciitis   . Prediabetes   . Vitamin D deficiency    Review of Systems    10 point systems review negative except as above.    Objective:   Physical Exam  BP 125/70   Pulse 80   Temp (!) 97.5 F (36.4 C)   Resp 18   Ht 6' 0.5" (1.842 m)   Wt 298 lb (135.2 kg)   BMI 39.86 kg/m   HEENT - WNL. Neck - supple.  Chest - Clear equal BS. Cor - Nl HS. RRR w/o sig MGR. PP 1(+). No edema. MS- FROM w/o deformities.  Gait Nl. Neuro -  Nl w/o focal abnormalities.    Assessment & Plan:   1. Essential hypertension  - advised random BP checks.  2. Class 3 severe obesity due to excess calories with serious comorbidity and body mass index  (BMI) of 40.0 to 44.9 in adult [facility])  - discussed diet & weight loss  3. Hyperlipidemia, mixed  4. Type 2 diabetes mellitus without complication, without long-term current use of insulin ([place])  - discussed prudent diet for weight loss.

## 2018-03-24 ENCOUNTER — Encounter: Payer: Self-pay | Admitting: Internal Medicine

## 2018-05-14 ENCOUNTER — Ambulatory Visit: Payer: Self-pay | Admitting: Adult Health Nurse Practitioner

## 2018-05-14 ENCOUNTER — Ambulatory Visit: Payer: Self-pay | Admitting: Physician Assistant

## 2018-05-21 ENCOUNTER — Ambulatory Visit: Payer: Self-pay | Admitting: Adult Health Nurse Practitioner

## 2018-05-24 ENCOUNTER — Other Ambulatory Visit: Payer: Self-pay

## 2018-05-24 ENCOUNTER — Ambulatory Visit (INDEPENDENT_AMBULATORY_CARE_PROVIDER_SITE_OTHER): Payer: Managed Care, Other (non HMO) | Admitting: Adult Health Nurse Practitioner

## 2018-05-24 ENCOUNTER — Encounter: Payer: Self-pay | Admitting: Adult Health Nurse Practitioner

## 2018-05-24 VITALS — BP 150/96 | HR 85 | Temp 97.9°F | Ht 72.5 in | Wt 299.0 lb

## 2018-05-24 DIAGNOSIS — I10 Essential (primary) hypertension: Secondary | ICD-10-CM | POA: Diagnosis not present

## 2018-05-24 DIAGNOSIS — E119 Type 2 diabetes mellitus without complications: Secondary | ICD-10-CM | POA: Diagnosis not present

## 2018-05-24 DIAGNOSIS — E782 Mixed hyperlipidemia: Secondary | ICD-10-CM

## 2018-05-24 DIAGNOSIS — F419 Anxiety disorder, unspecified: Secondary | ICD-10-CM

## 2018-05-24 DIAGNOSIS — E559 Vitamin D deficiency, unspecified: Secondary | ICD-10-CM

## 2018-05-24 DIAGNOSIS — Z79899 Other long term (current) drug therapy: Secondary | ICD-10-CM

## 2018-05-24 MED ORDER — BISOPROLOL-HYDROCHLOROTHIAZIDE 5-6.25 MG PO TABS: ORAL_TABLET | ORAL | 1 refills | Status: DC

## 2018-05-24 MED ORDER — METFORMIN HCL ER 500 MG PO TB24: ORAL_TABLET | ORAL | 3 refills | Status: DC

## 2018-05-24 NOTE — Patient Instructions (Addendum)
Today we are going to STOP Topamax.  Continue Phentermine 37.5mg , one tablet daily.  Continue dietary changes and increase exercise to help support your weight loss goal and overall health.  Metformin 500mg  XL change to one tablet twice a day.  Monitor your diarrhea symptoms this should continue to decrease.  Be mindful of constipation with the phentermine.  Should you become constipated consider adding Colace (stoool softener) daily, if too much take every other.    Continue to monitor your blood pressure.  If it is consistently 140/90 or higher, please let us know your medication may need adjusted.  We willl have your lab results in 1-3 days.  We will response via MyChart once I have the results and see you have signed up.  If not we will contact you on Monday.    Preventive Care for Adults  A healthy lifestyle and preventive care can promote health and wellness. Preventive health guidelines for men include the following key practices:  A routine yearly physical is a good way to check with your health care provider about your health and preventative screening. It is a chance to share any concerns and updates on your health and to receive a thorough exam.  Visit your dentist for a routine exam and preventative care every 6 months. Brush your teeth twice a day and floss once a day. Good oral hygiene prevents tooth decay and gum disease.  The frequency of eye exams is based on your age, health, family medical history, use of contact lenses, and other factors. Follow your health care provider's recommendations for frequency of eye exams.  Eat a healthy diet. Foods such as vegetables, fruits, whole grains, low-fat dairy products, and lean protein foods contain the nutrients you need without too many calories. Decrease your intake of foods high in solid fats, added sugars, and salt. Eat the right amount of calories for you. Get information about a proper diet from your health care provider,  if necessary.  Regular physical exercise is one of the most important things you can do for your health. Most adults should get at least 150 minutes of moderate-intensity exercise (any activity that increases your heart rate and causes you to sweat) each week. In addition, most adults need muscle-strengthening exercises on 2 or more days a week.  Maintain a healthy weight. The body mass index (BMI) is a screening tool to identify possible weight problems. It provides an estimate of body fat based on height and weight. Your health care provider can find your BMI and can help you achieve or maintain a healthy weight. For adults 20 years and older:  A BMI below 18.5 is considered underweight.  A BMI of 18.5 to 24.9 is normal.  A BMI of 25 to 29.9 is considered overweight.  A BMI of 30 and above is considered obese.  Maintain normal blood lipids and cholesterol levels by exercising and minimizing your intake of saturated fat. Eat a balanced diet with plenty of fruit and vegetables. Blood tests for lipids and cholesterol should begin at age 21 and be repeated every 5 years. If your lipid or cholesterol levels are high, you are over 50, or you are at high risk for heart disease, you may need your cholesterol levels checked more frequently. Ongoing high lipid and cholesterol levels should be treated with medicines if diet and exercise are not working.  If you smoke, find out from your health care provider how to quit. If you do not use tobacco,  do not start.  Lung cancer screening is recommended for adults aged 28-80 years who are at high risk for developing lung cancer because of a history of smoking. A yearly low-dose CT scan of the lungs is recommended for people who have at least a 30-pack-year history of smoking and are a current smoker or have quit within the past 15 years. A pack year of smoking is smoking an average of 1 pack of cigarettes a day for 1 year (for example: 1 pack a day for 30 years  or 2 packs a day for 15 years). Yearly screening should continue until the smoker has stopped smoking for at least 15 years. Yearly screening should be stopped for people who develop a health problem that would prevent them from having lung cancer treatment.  If you choose to drink alcohol, do not have more than 2 drinks per day. One drink is considered to be 12 ounces (355 mL) of beer, 5 ounces (148 mL) of wine, or 1.5 ounces (44 mL) of liquor.  Avoid use of street drugs. Do not share needles with anyone. Ask for help if you need support or instructions about stopping the use of drugs.  High blood pressure causes heart disease and increases the risk of stroke. Your blood pressure should be checked at least every 1-2 years. Ongoing high blood pressure should be treated with medicines, if weight loss and exercise are not effective.  If you are 47-46 years old, ask your health care provider if you should take aspirin to prevent heart disease.  Diabetes screening involves taking a blood sample to check your fasting blood sugar level. This should be done once every 3 years, after age 26, if you are within normal weight and without risk factors for diabetes. Testing should be considered at a younger age or be carried out more frequently if you are overweight and have at least 1 risk factor for diabetes.  Colorectal cancer can be detected and often prevented. Most routine colorectal cancer screening begins at the age of 52 and continues through age 66. However, your health care provider may recommend screening at an earlier age if you have risk factors for colon cancer. On a yearly basis, your health care provider may provide home test kits to check for hidden blood in the stool. Use of a small camera at the end of a tube to directly examine the colon (sigmoidoscopy or colonoscopy) can detect the earliest forms of colorectal cancer. Talk to your health care provider about this at age 29, when routine screening  begins. Direct exam of the colon should be repeated every 5-10 years through age 7, unless early forms of precancerous polyps or small growths are found.   Talk with your health care provider about prostate cancer screening.  Testicular cancer screening isrecommended for adult males. Screening includes self-exam, a health care provider exam, and other screening tests. Consult with your health care provider about any symptoms you have or any concerns you have about testicular cancer.  Use sunscreen. Apply sunscreen liberally and repeatedly throughout the day. You should seek shade when your shadow is shorter than you. Protect yourself by wearing long sleeves, pants, a wide-brimmed hat, and sunglasses year round, whenever you are outdoors.  Once a month, do a whole-body skin exam, using a mirror to look at the skin on your back. Tell your health care provider about new moles, moles that have irregular borders, moles that are larger than a pencil eraser, or moles that  have changed in shape or color.  Stay current with required vaccines (immunizations).  Influenza vaccine. All adults should be immunized every year.  Tetanus, diphtheria, and acellular pertussis (Td, Tdap) vaccine. An adult who has not previously received Tdap or who does not know his vaccine status should receive 1 dose of Tdap. This initial dose should be followed by tetanus and diphtheria toxoids (Td) booster doses every 10 years. Adults with an unknown or incomplete history of completing a 3-dose immunization series with Td-containing vaccines should begin or complete a primary immunization series including a Tdap dose. Adults should receive a Td booster every 10 years.  Varicella vaccine. An adult without evidence of immunity to varicella should receive 2 doses or a second dose if he has previously received 1 dose.  Human papillomavirus (HPV) vaccine. Males aged 42-21 years who have not received the vaccine previously should  receive the 3-dose series. Males aged 22-26 years may be immunized. Immunization is recommended through the age of 81 years for any male who has sex with males and did not get any or all doses earlier. Immunization is recommended for any person with an immunocompromised condition through the age of 48 years if he did not get any or all doses earlier. During the 3-dose series, the second dose should be obtained 4-8 weeks after the first dose. The third dose should be obtained 24 weeks after the first dose and 16 weeks after the second dose.  Zoster vaccine. One dose is recommended for adults aged 42 years or older unless certain conditions are present.    PREVNAR  - Pneumococcal 13-valent conjugate (PCV13) vaccine. When indicated, a person who is uncertain of his immunization history and has no record of immunization should receive the PCV13 vaccine. An adult aged 62 years or older who has certain medical conditions and has not been previously immunized should receive 1 dose of PCV13 vaccine. This PCV13 should be followed with a dose of pneumococcal polysaccharide (PPSV23) vaccine. The PPSV23 vaccine dose should be obtained at least 1 r more year(s) after the dose of PCV13 vaccine. An adult aged 66 years or older who has certain medical conditions and previously received 1 or more doses of PPSV23 vaccine should receive 1 dose of PCV13. The PCV13 vaccine dose should be obtained 1 or more years after the last PPSV23 vaccine dose.    PNEUMOVAX - Pneumococcal polysaccharide (PPSV23) vaccine. When PCV13 is also indicated, PCV13 should be obtained first. All adults aged 78 years and older should be immunized. An adult younger than age 15 years who has certain medical conditions should be immunized. Any person who resides in a nursing home or long-term care facility should be immunized. An adult smoker should be immunized. People with an immunocompromised condition and certain other conditions should receive both  PCV13 and PPSV23 vaccines. People with human immunodeficiency virus (HIV) infection should be immunized as soon as possible after diagnosis. Immunization during chemotherapy or radiation therapy should be avoided. Routine use of PPSV23 vaccine is not recommended for American Indians, [place] Natives, or people younger than 65 years unless there are medical conditions that require PPSV23 vaccine. When indicated, people who have unknown immunization and have no record of immunization should receive PPSV23 vaccine. One-time revaccination 5 years after the first dose of PPSV23 is recommended for people aged 19-64 years who have chronic kidney failure, nephrotic syndrome, asplenia, or immunocompromised conditions. People who received 1-2 doses of PPSV23 before age 39 years should receive another dose of  PPSV23 vaccine at age 76 years or later if at least 5 years have passed since the previous dose. Doses of PPSV23 are not needed for people immunized with PPSV23 at or after age 5 years.    Hepatitis A vaccine. Adults who wish to be protected from this disease, have certain high-risk conditions, work with hepatitis A-infected animals, work in hepatitis A research labs, or travel to or work in countries with a high rate of hepatitis A should be immunized. Adults who were previously unvaccinated and who anticipate close contact with an international adoptee during the first 60 days after arrival in the [place] from a country with a high rate of hepatitis A should be immunized.    Hepatitis B vaccine. Adults should be immunized if they wish to be protected from this disease, have certain high-risk conditions, may be exposed to blood or other infectious body fluids, are household contacts or sex partners of hepatitis B positive people, are clients or workers in certain care facilities, or travel to or work in countries with a high rate of hepatitis B.   Preventive Service / Frequency   Ages 53 to  10  Blood pressure check.  Lipid and cholesterol check  Lung cancer screening. / Every year if you are aged 22-80 years and have a 30-pack-year history of smoking and currently smoke or have quit within the past 15 years. Yearly screening is stopped once you have quit smoking for at least 15 years or develop a health problem that would prevent you from having lung cancer treatment.  Fecal occult blood test (FOBT) of stool. / Every year beginning at age 21 and continuing until age 74. You may not have to do this test if you get a colonoscopy every 10 years.  Flexible sigmoidoscopy** or colonoscopy.** / Every 5 years for a flexible sigmoidoscopy or every 10 years for a colonoscopy beginning at age 26 and continuing until age 26. Screening for abdominal aortic aneurysm (AAA)  by ultrasound is recommended for people who have history of high blood pressure or who are current or former smokers. +++++++++++ Recommend Adult Low Dose Aspirin or  coated  Aspirin 81 mg daily  To reduce risk of Colon Cancer 20 %,  Skin Cancer 26 % ,  Malignant Melanoma 46%  and  Pancreatic cancer 60% ++++++++++++++++++++ Vitamin D goal  is between 70-100.  Please make sure that you are taking your Vitamin D as directed.  It is very important as a natural anti-inflammatory  helping hair, skin, and nails, as well as reducing stroke and heart attack risk.  It helps your bones and helps with mood. It also decreases numerous cancer risks so please take it as directed.  Low Vit D is associated with a 200-300% higher risk for CANCER  and 200-300% higher risk for HEART   ATTACK  &  STROKE.   .....................................Marland Kitchen It is also associated with higher death rate at younger ages,  autoimmune diseases like Rheumatoid arthritis, Lupus, Multiple Sclerosis.    Also many other serious conditions, like depression, Alzheimer's Dementia, infertility, muscle aches, fatigue, fibromyalgia - just to name a  few. +++++++++++++++++++++ Recommend the book "The END of DIETING" by Dr Excell Seltzer  & the book "The END of DIABETES " by Dr Excell Seltzer At [email or web address] - get book & Audio CD's    Being diabetic has a  300% increased risk for heart attack, stroke, cancer, and alzheimer- type vascular dementia. It is very important that you work  harder with diet by avoiding all foods that are white. Avoid white rice (brown & wild rice is OK), white potatoes (sweetpotatoes in moderation is OK), White bread or wheat bread or anything made out of white flour like bagels, donuts, rolls, buns, biscuits, cakes, pastries, cookies, pizza crust, and pasta (made from white flour & egg whites) - vegetarian pasta or spinach or wheat pasta is OK. Multigrain breads like Arnold's or Pepper[employer], or multigrain sandwich thins or flatbreads.  Diet, exercise and weight loss can reverse and cure diabetes in the early stages.  Diet, exercise and weight loss is very important in the control and prevention of complications of diabetes which affects every system in your body, ie. Brain - dementia/stroke, eyes - glaucoma/blindness, heart - heart attack/heart failure, kidneys - dialysis, stomach - gastric paralysis, intestines - malabsorption, nerves - severe painful neuritis, circulation - gangrene & loss of a leg(s), and finally cancer and Alzheimers.    I recommend avoid fried & greasy foods,  sweets/candy, white rice (brown or wild rice or Quinoa is OK), white potatoes (sweet potatoes are OK) - anything made from white flour - bagels, doughnuts, rolls, buns, biscuits,white and wheat breads, pizza crust and traditional pasta made of white flour & egg white(vegetarian pasta or spinach or wheat pasta is OK).  Multi-grain bread is OK - like multi-grain flat bread or sandwich thins. Avoid alcohol in excess. Exercise is also important.    Eat all the vegetables you want - avoid meat, especially red meat and dairy - especially cheese.  Cheese is  the most concentrated form of trans-fats which is the worst thing to clog up our arteries. Veggie cheese is OK which can be found in the fresh produce section at [employer] or [employer] or [employer]  ++++++++++++++++++++++ DASH Eating Plan  DASH stands for "Dietary Approaches to Stop Hypertension."   The DASH eating plan is a healthy eating plan that has been shown to reduce high blood pressure (hypertension). Additional health benefits may include reducing the risk of type 2 diabetes mellitus, heart disease, and stroke. The DASH eating plan may also help with weight loss. WHAT DO I NEED TO KNOW ABOUT THE DASH EATING PLAN? For the DASH eating plan, you will follow these general guidelines:  Choose foods with a percent daily value for sodium of less than 5% (as listed on the food label).  Use salt-free seasonings or herbs instead of table salt or sea salt.  Check with your health care provider or pharmacist before using salt substitutes.  Eat lower-sodium products, often labeled as "lower sodium" or "no salt added."  Eat fresh foods.  Eat more vegetables, fruits, and low-fat dairy products.  Choose whole grains. Look for the word "whole" as the first word in the ingredient list.  Choose fish   Limit sweets, desserts, sugars, and sugary drinks.  Choose heart-healthy fats.  Eat veggie cheese   Eat more home-cooked food and less restaurant, buffet, and fast food.  Limit fried foods.  Cook foods using methods other than frying.  Limit canned vegetables. If you do use them, rinse them well to decrease the sodium.  When eating at a restaurant, ask that your food be prepared with less salt, or no salt if possible.                      WHAT FOODS CAN I EAT? Read Dr Fara Olden Fuhrman's books on The End of Dieting & The End of Diabetes  Grains Whole grain or whole wheat bread. Brown rice. Whole grain or whole wheat pasta. Quinoa, bulgur, and whole grain cereals. Low-sodium  cereals. Corn or whole wheat flour tortillas. Whole grain cornbread. Whole grain crackers. Low-sodium crackers.  Vegetables Fresh or frozen vegetables (raw, steamed, roasted, or grilled). Low-sodium or reduced-sodium tomato and vegetable juices. Low-sodium or reduced-sodium tomato sauce and paste. Low-sodium or reduced-sodium canned vegetables.   Fruits All fresh, canned (in natural juice), or frozen fruits.  Protein Products  All fish and seafood.  Dried beans, peas, or lentils. Unsalted nuts and seeds. Unsalted canned beans.  Dairy Low-fat dairy products, such as skim or 1% milk, 2% or reduced-fat cheeses, low-fat ricotta or cottage cheese, or plain low-fat yogurt. Low-sodium or reduced-sodium cheeses.  Fats and Oils Tub margarines without trans fats. Light or reduced-fat mayonnaise and salad dressings (reduced sodium). Avocado. Safflower, olive, or canola oils. Natural peanut or almond butter.  Other Unsalted popcorn and pretzels. The items listed above may not be a complete list of recommended foods or beverages. Contact your dietitian for more options.  +++++++++++++++++++  WHAT FOODS ARE NOT RECOMMENDED? Grains/ White flour or wheat flour White bread. White pasta. White rice. Refined cornbread. Bagels and croissants. Crackers that contain trans fat.  Vegetables  Creamed or fried vegetables. Vegetables in a . Regular canned vegetables. Regular canned tomato sauce and paste. Regular tomato and vegetable juices.  Fruits Dried fruits. Canned fruit in light or heavy syrup. Fruit juice.  Meat and Other Protein Products Meat in general - RED meat & White meat.  Fatty cuts of meat. Ribs, chicken wings, all processed meats as bacon, sausage, bologna, salami, fatback, hot dogs, bratwurst and packaged luncheon meats.  Dairy Whole or 2% milk, cream, half-and-half, and cream cheese. Whole-fat or sweetened yogurt. Full-fat cheeses or blue cheese. Non-dairy creamers and whipped toppings.  Processed cheese, cheese spreads, or cheese curds.  Condiments Onion and garlic salt, seasoned salt, table salt, and sea salt. Canned and packaged gravies. Worcestershire sauce. Tartar sauce. Barbecue sauce. Teriyaki sauce. Soy sauce, including reduced sodium. Steak sauce. Fish sauce. Oyster sauce. Cocktail sauce. Horseradish. Ketchup and mustard. Meat flavorings and tenderizers. Bouillon cubes. Hot sauce. Tabasco sauce. Marinades. Taco seasonings. Relishes.  Fats and Oils Butter, stick margarine, lard, shortening and bacon fat. Coconut, palm kernel, or palm oils. Regular salad dressings.  Pickles and olives. Salted popcorn and pretzels.  The items listed above may not be a complete list of foods and beverages to avoid.

## 2018-05-24 NOTE — Progress Notes (Signed)
3 Month Follow Up Camara was seen today for 3 month follow-up.  Adverse GI side effects from increase to 1,000mg  metformin BID.  Able to tolerate 500mg  BID, continue this.  Discussed considering increase if no A1c reduction.  Also working on weight loss, should help with this.  For weight loss, continue phemtermine daily, stop topamax at this time related to side effects.    Diagnoses and all orders for this visit:  Essential hypertension -     CBC with Differential/Platelet -     COMPLETE METABOLIC PANEL WITH GFR -     bisoprolol-hydrochlorothiazide (ZIAC) 5-6.25 MG tablet; Take 1 tablet daily for BP Continue to monitor blood pressure, goal 130/80. If consistently above 140/90, contact office Continue DASH diet.   Reminder to go to the ER if any CP, SOB, nausea, dizziness, severe HA, changes vision/speech, left arm numbness and tingling and jaw pain.  Morbid obesity (HCC) BMI 39.99 Discussed dietary and exercise modifications STOP topamax Continue phentermine 37.5mg  daily, take in the morning. -     Hemoglobin A1c  Hyperlipidemia, mixed Continue medication, doing well Taking Crestor 40mg  every other day -     Lipid panel  Type 2 diabetes mellitus without complication, without long-term current use of insulin (HCC) -     Hemoglobin A1c -     metFORMIN (GLUCOPHAGE-XR) 500 MG 24 hr tablet; Take 1 tablet by mouth twice a day with meals for Diabetes. Perform daily foot/skin check, notify office of any concerning changes.   Anxiety tension state Doing well with this Stress management techniques discussed, increase water, good sleep hygiene discussed, increase exercise, and increase veggies.   Vitamin D deficiency Continue supplementation Will re-check level next visit  Medication management -     CBC with Differential/Platelet -     COMPLETE METABOLIC PANEL WITH GFR -     Lipid panel -     Hemoglobin A1c  We will contact you in 1-3 business days with lab results drawn  today.  Follow up in 3 month(s) for chronic condition management.  Call or return with new or worsening symptoms as discussed in appointment.  May contact office via phone [phone] or [place].   Continue diet and meds as discussed. Further disposition pending results of labs. Discussed med's effects and SE's.   Over 30 minutes of exam, counseling, chart review, and critical decision making was performed.   Future Appointments  Date Time Provider [place]  05/24/2018  9:00 AM Garnet Sierras, NP [facility] None  08/20/2018  3:30 PM Unk Pinto, MD [facility] None  02/18/2019  3:45 PM Unk Pinto, MD [facility] None    ----------------------------------------------------------------------------------------------------------------------  HPI 47 y.o. male  presents for 3 month follow up on HTN, HLD, DMII, weight and vitamin D deficiency.    Reports that he is having severe diarrhea that has decreased to 20 times a day for the past three weeks.  This has slowed and now having only two today and had a total for four yesterday.  Reports they are loose but formed.  Reports he had some gas and bloating with this initially but these symptoms have also started to resolve.  He was concerned that the phentermine was causing this so he now takes the medication every other day.  He is up one pound from last visit in January.  Reports he is still watching his dietary intake but since he has not been feeling well he knows he has consumed more sugar related to drinking ginger-ale and  foods higher in carbohydrates related to diarrhea. Three months ago he was started on Benicar for elevated BP. He denies any c/o HA's, dizziness, CP, palpitations, dyspnea or edema.   A1c had gone up from 6.6% to 7.3% and he was started on Metformin.   Recheck B/P 148/88.       BMI is There is no height or weight on file to calculate BMI., he has been working on diet and exercise. Wt Readings from  Last 3 Encounters:  03/19/18 298 lb (135.2 kg)  02/06/18 (!) 303 lb 12.8 oz (137.8 kg)  09/17/17 (!) 302 lb (137 kg)    His blood pressure has been controlled at home, today their BP is    He does workout. He denies any cardiac symptoms, chest pains, palpitations, shortness of breath, dizziness or lower extremity edema.      He is on cholesterol medication His recent labs showed severe HLD (Chol 344 / TG 568) & he was switched from Pravastatin to Rosuvastatin.  He is tolerating this medication without complications. Lab Results  Component Value Date   CHOL 344 (H) 02/06/2018   HDL 44 02/06/2018   [place]  02/06/2018     Comment:     . LDL cholesterol not calculated. Triglyceride levels greater than 400 mg/dL invalidate calculated LDL results. . Reference range: <100 . Desirable range <100 mg/dL for primary prevention;   <70 mg/dL for patients with CHD or diabetic patients  with > or = 2 CHD risk factors. Marland Kitchen LDL-C is now calculated using the Martin-Hopkins  calculation, which is a validated novel method providing  better accuracy than the Friedewald equation in the  estimation of LDL-C.  Cresenciano Genre et al. Annamaria Helling. 3976;734(19): 2061-2068  (http://education.[employer].com/faq/FAQ164)    TRIG 568 (H) 02/06/2018   CHOLHDL 7.8 (H) 02/06/2018    He has been working on diet and exercise for prediabetes, and denies hyperglycemia, hypoglycemia , increased appetite, polydipsia, polyuria and visual disturbances. Last A1C in the office was:  Lab Results  Component Value Date   HGBA1C 7.3 (H) 02/06/2018   Patient is on Vitamin D supplement.   Lab Results  Component Value Date   VD25OH 15 (L) 02/06/2018       Current Medications:  Current Outpatient Medications on File Prior to Visit  Medication Sig  . bisoprolol-hydrochlorothiazide (ZIAC) 5-6.25 MG tablet Take 1 tablet daily for BP  . metFORMIN (GLUCOPHAGE-XR) 500 MG 24 hr tablet Take 2 tablets 2 x/ day with meals  for  Diabetes  . olmesartan (BENICAR) 40 MG tablet Take 1/2 to 1 tablet at night for BP  . phentermine (ADIPEX-P) 37.5 MG tablet Take 1/2 to 1 tablet every morning for dieting & weight  loss  . rosuvastatin (CRESTOR) 40 MG tablet Take  1 tablet daily  for Cholesterol  . topiramate (TOPAMAX) 50 MG tablet Take 1 tablet 2 x /day at suppertime & bedtime  for dieting & weight loss   No current facility-administered medications on file prior to visit.     Allergies:  No Known Allergies   Medical History:  Past Medical History:  Diagnosis Date  . Hyperlipidemia   . Hypertension   . Morbid obesity ([place])   . Plantar fasciitis   . Prediabetes   . Vitamin D deficiency     Family history- Reviewed and unchanged   Social history- Reviewed and unchanged   Names of Other Physician/Practitioners you currently use: 1. [facility] here for primary care  2. Eye exam DUE 3. Dentist scheduled for 2020  Patient Care Team: Unk Pinto, MD as PCP - General (Internal Medicine)   Screening Tests: Immunization History  Administered Date(s) Administered  . Influenza Inj Mdck Quad With Preservative 02/06/2018  . PPD Test 02/06/2018  . Tdap 03/13/2012     Vaccinations: TD or Tdap: 2014 Influenza: 2019  Pneumococcal: N/A Prevnar13: N/A Shingles/Zostavax: N/A   Preventative Care: Last colonoscopy: N/A    Review of Systems:  Review of Systems  Constitutional: Negative for chills, diaphoresis, fever, malaise/fatigue and weight loss.  HENT: Negative for congestion, ear discharge, ear pain, hearing loss, nosebleeds, sinus pain, sore throat and tinnitus.   Eyes: Negative for blurred vision, double vision, photophobia, pain, discharge and redness.  Respiratory: Negative for cough, hemoptysis, sputum production, shortness of breath, wheezing and stridor.   Cardiovascular: Negative for chest pain, palpitations, orthopnea, claudication, leg swelling and  PND.  Gastrointestinal: Positive for diarrhea. Negative for abdominal pain, blood in stool, constipation, heartburn, melena, nausea and vomiting.  Genitourinary: Negative for dysuria, flank pain, frequency, hematuria and urgency.  Musculoskeletal: Negative for back pain, falls, joint pain, myalgias and neck pain.  Skin: Negative for itching and rash.  Neurological: Negative for dizziness, tingling, tremors, sensory change, speech change, focal weakness, seizures, loss of consciousness, weakness and headaches.  Endo/Heme/Allergies: Negative for environmental allergies and polydipsia. Does not bruise/bleed easily.  Psychiatric/Behavioral: Negative for depression, hallucinations, memory loss, substance abuse and suicidal ideas. The patient is not nervous/anxious and does not have insomnia.       Physical Exam: There were no vitals taken for this visit. Wt Readings from Last 3 Encounters:  03/19/18 298 lb (135.2 kg)  02/06/18 (!) 303 lb 12.8 oz (137.8 kg)  09/17/17 (!) 302 lb (137 kg)   General Appearance: Well nourished, in no apparent distress. Eyes: PERRLA, EOMs, conjunctiva no swelling or erythema ENT/Mouth: Ext aud canals clear, TMs without erythema, bulging. No erythema, swelling, or exudate on post pharynx.  Tonsils not swollen or erythematous. Hearing normal.  Neck: Supple, thyroid normal.  Respiratory: Respiratory effort normal, BS equal bilaterally without rales, rhonchi, wheezing or stridor.  Cardio: RRR with no MRGs. Brisk peripheral pulses without edema.  Abdomen: Soft, + BS.  Non tender, no guarding, rebound, hernias, masses. Musculoskeletal: Full ROM, 5/5 strength,normal gait Skin: Warm, dry without rashes, lesions, ecchymosis.  Neuro: Cranial nerves intact. No cerebellar symptoms.  Psych: Awake and oriented X 3, normal affect, Insight and Judgment appropriate.    Garnet Sierras, NP [facility] 7:00 AM

## 2018-05-25 LAB — LIPID PANEL
Cholesterol: 180 mg/dL (ref <200)
HDL: 43 mg/dL (ref >=40)
LDL Cholesterol (Calc): 100 mg/dL (calc) — ABNORMAL HIGH
Non-HDL Cholesterol (Calc): 137 mg/dL (calc) — ABNORMAL HIGH (ref <130)
Total CHOL/HDL Ratio: 4.2 (calc) (ref <5.0)
Triglycerides: 257 mg/dL — ABNORMAL HIGH (ref <150)

## 2018-05-25 LAB — CBC WITH DIFFERENTIAL/PLATELET
Absolute Monocytes: 748 cells/uL (ref 200–950)
Basophils Absolute: 60 cells/uL (ref 0–200)
Basophils Relative: 0.7 %
Eosinophils Absolute: 170 cells/uL (ref 15–500)
Eosinophils Relative: 2 %
HCT: 41.1 % (ref 38.5–50.0)
Hemoglobin: 14.1 g/dL (ref 13.2–17.1)
Lymphs Abs: 2686 cells/uL (ref 850–3900)
MCH: 30.3 pg (ref 27.0–33.0)
MCHC: 34.3 g/dL (ref 32.0–36.0)
MCV: 88.2 fL (ref 80.0–100.0)
MPV: 10.3 fL (ref 7.5–12.5)
Monocytes Relative: 8.8 %
Neutro Abs: 4837 cells/uL (ref 1500–7800)
Neutrophils Relative %: 56.9 %
Platelets: 315 10*3/uL (ref 140–400)
RBC: 4.66 10*6/uL (ref 4.20–5.80)
RDW: 14.1 % (ref 11.0–15.0)
Total Lymphocyte: 31.6 %
WBC: 8.5 10*3/uL (ref 3.8–10.8)

## 2018-05-25 LAB — COMPLETE METABOLIC PANEL WITH GFR
AG Ratio: 1.4 (calc) (ref 1.0–2.5)
ALT: 40 U/L (ref 9–46)
AST: 26 U/L (ref 10–40)
Albumin: 4.1 g/dL (ref 3.6–5.1)
Alkaline phosphatase (APISO): 102 U/L (ref 36–130)
BUN: 11 mg/dL (ref 7–25)
CO2: 25 mmol/L (ref 20–32)
Calcium: 9.2 mg/dL (ref 8.6–10.3)
Chloride: 108 mmol/L (ref 98–110)
Creat: 0.89 mg/dL (ref 0.60–1.35)
GFR, Est African American: 117 mL/min/{1.73_m2} (ref >=60)
GFR, Est Non African American: 101 mL/min/{1.73_m2} (ref >=60)
Globulin: 3 g/dL (calc) (ref 1.9–3.7)
Glucose, Bld: 113 mg/dL — ABNORMAL HIGH (ref 65–99)
Potassium: 4.4 mmol/L (ref 3.5–5.3)
Sodium: 142 mmol/L (ref 135–146)
Total Bilirubin: 0.3 mg/dL (ref 0.2–1.2)
Total Protein: 7.1 g/dL (ref 6.1–8.1)

## 2018-05-25 LAB — HEMOGLOBIN A1C
Hgb A1c MFr Bld: 6.7 % of total Hgb — ABNORMAL HIGH (ref <5.7)
Mean Plasma Glucose: 146 (calc)
eAG (mmol/L): 8.1 (calc)

## 2018-05-27 ENCOUNTER — Encounter: Payer: Self-pay | Admitting: Adult Health Nurse Practitioner

## 2018-08-19 NOTE — Progress Notes (Addendum)
THIS ENCOUNTER IS A VIRTUAL VISIT DUE TO COVID-19 - PATIENT WAS NOT SEEN IN THE OFFICE.  PATIENT HAS CONSENTED TO VIRTUAL VISIT / TELEMEDICINE VISIT  This provider placed a call to [facility] using telephone, his appointment was changed to a virtual office visit to reduce the risk of exposure to the COVID-19 virus and to help [facility] remain healthy and safe. The virtual visit will also provide continuity of care. He verbalizes understanding.   Virtual Visit via telephone Note  I connected with  Bonna Gains  on 08/20/18  by telephone.  I verified that I am speaking with the correct person using two identifiers.        I discussed the limitations of evaluation and management by telemedicine and the availability of in person appointments. The patient expressed understanding and agreed to proceed.  History of Present Illness:      This very nice 48 y.o. DWM  presents for 3 month follow up with HTN, HLD, Pre-Diabetes and Vitamin D Deficiency.       Patient is treated for HTN & BP has been controlled at home. Today's BP: 129/88. Patient has had no complaints of any cardiac type chest pain, palpitations, dyspnea / orthopnea / PND, dizziness, claudication, or dependent edema.      Hyperlipidemia is controlled with diet & meds. Patient denies myalgias or other med SE's. Last Lipids were  Lab Results  Component Value Date   CHOL 180 05/24/2018   HDL 43 05/24/2018   LDLCALC 100 (H) 05/24/2018   TRIG 257 (H) 05/24/2018   CHOLHDL 4.2 05/24/2018       Also, the patient has history of T2_NIDDM  (A1c 8.4% / Aug 2018)  & patient is on Metformin. He  has had no symptoms of reactive hypoglycemia, diabetic polys, paresthesias or visual blurring.  Last A1c was not at goal: Lab Results  Component Value Date   HGBA1C 6.7 (H) 05/24/2018       Further, the patient also has history of Vitamin D Deficiency ("21" / 2018) and supplements vitamin D without any suspected side-effects. Last  vitamin D was still very low: Lab Results  Component Value Date   VD25OH 15 (L) 02/06/2018   Current Outpatient Medications on File Prior to Visit  Medication Sig  . bisoprolol-hydrochlorothiazide (ZIAC) 5-6.25 MG tablet Take 1 tablet daily for BP  . metFORMIN (GLUCOPHAGE-XR) 500 MG 24 hr tablet Take 1 tablet by mouth twice a day with meals for Diabetes.  Marland Kitchen olmesartan (BENICAR) 40 MG tablet Take 1/2 to 1 tablet at night for BP  . phentermine (ADIPEX-P) 37.5 MG tablet Take 1/2 to 1 tablet every morning for dieting & weight  loss  . rosuvastatin (CRESTOR) 40 MG tablet Take  1 tablet daily  for Cholesterol   No current facility-administered medications on file prior to visit.    No Known Allergies PMHx:   Past Medical History:  Diagnosis Date  . Hyperlipidemia   . Hypertension   . Morbid obesity ([place])   . Plantar fasciitis   . Prediabetes   . Vitamin D deficiency    Immunization History  Administered Date(s) Administered  . Influenza Inj Mdck Quad With Preservative 02/06/2018  . PPD Test 02/06/2018  . Tdap 03/13/2012   FHx:    Reviewed / unchanged  SHx:    Reviewed / unchanged   Systems Review:  Constitutional: Denies fever, chills, wt changes, headaches, insomnia, fatigue, night sweats, change in appetite. Eyes: Denies redness,  blurred vision, diplopia, discharge, itchy, watery eyes.  ENT: Denies discharge, congestion, post nasal drip, epistaxis, sore throat, earache, hearing loss, dental pain, tinnitus, vertigo, sinus pain, snoring.  CV: Denies chest pain, palpitations, irregular heartbeat, syncope, dyspnea, diaphoresis, orthopnea, PND, claudication or edema. Respiratory: denies cough, dyspnea, DOE, pleurisy, hoarseness, laryngitis, wheezing.  Gastrointestinal: Denies dysphagia, odynophagia, heartburn, reflux, water brash, abdominal pain or cramps, nausea, vomiting, bloating, diarrhea, constipation, hematemesis, melena, hematochezia  or hemorrhoids. Genitourinary: Denies  dysuria, frequency, urgency, nocturia, hesitancy, discharge, hematuria or flank pain. Musculoskeletal: Denies arthralgias, myalgias, stiffness, jt. swelling, pain, limping or strain/sprain.  Skin: Denies pruritus, rash, hives, warts, acne, eczema or change in skin lesion(s). Neuro: No weakness, tremor, incoordination, spasms, paresthesia or pain. Psychiatric: Denies confusion, memory loss or sensory loss. Endo: Denies change in weight, skin or hair change.  Heme/Lymph: No excessive bleeding, bruising or enlarged lymph nodes.  Physical Exam  BP 129/88   Wt 291 lb (132 kg)   BMI 38.92 kg/m    General : Well sounding patient in no apparent distress HEENT: no hoarseness, no cough for duration of visit Lungs: speaks in complete sentences, no audible wheezing, no apparent distress Neurological: alert, oriented x 3 Psychiatric: pleasant, judgement appropriate   Assessment and Plan:  1. Essential hypertension  - Continue medication, monitor blood pressure at home.  - Continue DASH diet.  Reminder to go to the ER if any CP,  SOB, nausea, dizziness, severe HA, changes vision/speech.  - CBC with Differential/Platelet; Future - COMPLETE METABOLIC PANEL WITH GFR; Future - Magnesium; Future - TSH; Future  2. Hyperlipidemia, mixed  - Continue diet/meds, exercise,& lifestyle modifications.  - Continue monitor periodic cholesterol/liver & renal functions   - Lipid panel; Future - TSH; Future  3. Type 2 diabetes mellitus with hyperosmolarity without coma, without long-term current use of insulin (HCC)  - Continue diet, exercise  - Lifestyle modifications.  - Monitor appropriate labs.  - Hemoglobin A1c; Future - Insulin, random; Future  4. Vitamin D deficiency  - Continue supplementation.   - VITAMIN D 25 Hydroxy (Vit-D Deficiency, Fractures); Future  5. Medication management  - CBC with Differential/Platelet; Future - COMPLETE METABOLIC PANEL WITH GFR; Future - Magnesium;  Future - Lipid panel; Future - TSH; Future - Hemoglobin A1c; Future - Insulin, random; Future - VITAMIN D 25 Hydroxy (Vit-D Deficiency, Fractures); Future      Discussed  regular exercise, BP monitoring, weight control to achieve/maintain BMI less than 25 and discussed med and SE's.  Patient is amenable to coming in for a lab visit   to assess and monitor clinical status with further disposition pending results of labs. I discussed the assessment and treatment plan with the patient. The patient was provided an opportunity to ask questions and all were answered. The patient agreed with the plan and demonstrated an understanding of the instructions. I provided  18 minutes of non-face-to-face time during this encounter and over 26 minutes of exam, counseling, chart review and  complex critical decision making was performed   Kirtland Bouchard, MD

## 2018-08-19 NOTE — Patient Instructions (Signed)

## 2018-08-20 ENCOUNTER — Other Ambulatory Visit: Payer: Self-pay

## 2018-08-20 ENCOUNTER — Ambulatory Visit: Payer: Managed Care, Other (non HMO) | Admitting: Internal Medicine

## 2018-08-20 ENCOUNTER — Encounter: Payer: Self-pay | Admitting: Internal Medicine

## 2018-08-20 VITALS — BP 129/88 | HR 70 | Temp 97.0°F | Resp 12 | Ht 72.5 in | Wt 291.0 lb

## 2018-08-20 DIAGNOSIS — E782 Mixed hyperlipidemia: Secondary | ICD-10-CM

## 2018-08-20 DIAGNOSIS — Z79899 Other long term (current) drug therapy: Secondary | ICD-10-CM

## 2018-08-20 DIAGNOSIS — E1100 Type 2 diabetes mellitus with hyperosmolarity without nonketotic hyperglycemic-hyperosmolar coma (NKHHC): Secondary | ICD-10-CM

## 2018-08-20 DIAGNOSIS — I10 Essential (primary) hypertension: Secondary | ICD-10-CM | POA: Diagnosis not present

## 2018-08-20 DIAGNOSIS — E559 Vitamin D deficiency, unspecified: Secondary | ICD-10-CM

## 2018-08-22 ENCOUNTER — Other Ambulatory Visit: Payer: Self-pay | Admitting: Internal Medicine

## 2018-08-29 ENCOUNTER — Other Ambulatory Visit: Payer: Self-pay

## 2018-08-29 ENCOUNTER — Other Ambulatory Visit: Payer: Managed Care, Other (non HMO)

## 2018-08-29 DIAGNOSIS — E559 Vitamin D deficiency, unspecified: Secondary | ICD-10-CM

## 2018-08-29 DIAGNOSIS — E1100 Type 2 diabetes mellitus with hyperosmolarity without nonketotic hyperglycemic-hyperosmolar coma (NKHHC): Secondary | ICD-10-CM

## 2018-08-29 DIAGNOSIS — E782 Mixed hyperlipidemia: Secondary | ICD-10-CM | POA: Diagnosis not present

## 2018-08-29 DIAGNOSIS — Z79899 Other long term (current) drug therapy: Secondary | ICD-10-CM

## 2018-08-29 DIAGNOSIS — I10 Essential (primary) hypertension: Secondary | ICD-10-CM

## 2018-08-30 LAB — VITAMIN D 25 HYDROXY (VIT D DEFICIENCY, FRACTURES): Vit D, 25-Hydroxy: 22 ng/mL — ABNORMAL LOW (ref 30–100)

## 2018-08-30 LAB — COMPLETE METABOLIC PANEL WITH GFR
AG Ratio: 1.3 (calc) (ref 1.0–2.5)
ALT: 30 U/L (ref 9–46)
AST: 20 U/L (ref 10–40)
Albumin: 3.9 g/dL (ref 3.6–5.1)
Alkaline phosphatase (APISO): 91 U/L (ref 36–130)
BUN: 13 mg/dL (ref 7–25)
CO2: 29 mmol/L (ref 20–32)
Calcium: 9.3 mg/dL (ref 8.6–10.3)
Chloride: 104 mmol/L (ref 98–110)
Creat: 0.95 mg/dL (ref 0.60–1.35)
GFR, Est African American: 108 mL/min/{1.73_m2} (ref >=60)
GFR, Est Non African American: 94 mL/min/{1.73_m2} (ref >=60)
Globulin: 3 g/dL (calc) (ref 1.9–3.7)
Glucose, Bld: 156 mg/dL — ABNORMAL HIGH (ref 65–99)
Potassium: 4.4 mmol/L (ref 3.5–5.3)
Sodium: 140 mmol/L (ref 135–146)
Total Bilirubin: 0.4 mg/dL (ref 0.2–1.2)
Total Protein: 6.9 g/dL (ref 6.1–8.1)

## 2018-08-30 LAB — CBC WITH DIFFERENTIAL/PLATELET
Absolute Monocytes: 783 cells/uL (ref 200–950)
Basophils Absolute: 62 cells/uL (ref 0–200)
Basophils Relative: 0.7 %
Eosinophils Absolute: 238 cells/uL (ref 15–500)
Eosinophils Relative: 2.7 %
HCT: 42.9 % (ref 38.5–50.0)
Hemoglobin: 14.6 g/dL (ref 13.2–17.1)
Lymphs Abs: 2895 cells/uL (ref 850–3900)
MCH: 30.5 pg (ref 27.0–33.0)
MCHC: 34 g/dL (ref 32.0–36.0)
MCV: 89.6 fL (ref 80.0–100.0)
MPV: 10.9 fL (ref 7.5–12.5)
Monocytes Relative: 8.9 %
Neutro Abs: 4822 cells/uL (ref 1500–7800)
Neutrophils Relative %: 54.8 %
Platelets: 272 10*3/uL (ref 140–400)
RBC: 4.79 10*6/uL (ref 4.20–5.80)
RDW: 14.1 % (ref 11.0–15.0)
Total Lymphocyte: 32.9 %
WBC: 8.8 10*3/uL (ref 3.8–10.8)

## 2018-08-30 LAB — LIPID PANEL
Cholesterol: 173 mg/dL (ref <200)
HDL: 42 mg/dL (ref >=40)
LDL Cholesterol (Calc): 94 mg/dL (calc)
Non-HDL Cholesterol (Calc): 131 mg/dL (calc) — ABNORMAL HIGH (ref <130)
Total CHOL/HDL Ratio: 4.1 (calc) (ref <5.0)
Triglycerides: 259 mg/dL — ABNORMAL HIGH (ref <150)

## 2018-08-30 LAB — TSH: TSH: 2.58 mIU/L (ref 0.40–4.50)

## 2018-08-30 LAB — MAGNESIUM: Magnesium: 1.8 mg/dL (ref 1.5–2.5)

## 2018-08-30 LAB — HEMOGLOBIN A1C
Hgb A1c MFr Bld: 7.5 % of total Hgb — ABNORMAL HIGH (ref <5.7)
Mean Plasma Glucose: 169 (calc)
eAG (mmol/L): 9.3 (calc)

## 2018-08-30 LAB — INSULIN, RANDOM: Insulin: 26.1 u[IU]/mL — ABNORMAL HIGH

## 2018-11-26 ENCOUNTER — Other Ambulatory Visit: Payer: Self-pay

## 2018-11-26 ENCOUNTER — Encounter: Payer: Self-pay | Admitting: Adult Health Nurse Practitioner

## 2018-11-26 ENCOUNTER — Ambulatory Visit: Payer: Managed Care, Other (non HMO) | Admitting: Adult Health Nurse Practitioner

## 2018-11-26 ENCOUNTER — Encounter (INDEPENDENT_AMBULATORY_CARE_PROVIDER_SITE_OTHER): Payer: Self-pay

## 2018-11-26 VITALS — BP 142/90 | HR 89 | Temp 97.5°F | Wt 277.6 lb

## 2018-11-26 DIAGNOSIS — R739 Hyperglycemia, unspecified: Secondary | ICD-10-CM | POA: Diagnosis not present

## 2018-11-26 DIAGNOSIS — Z7189 Other specified counseling: Secondary | ICD-10-CM

## 2018-11-26 DIAGNOSIS — E1100 Type 2 diabetes mellitus with hyperosmolarity without nonketotic hyperglycemic-hyperosmolar coma (NKHHC): Secondary | ICD-10-CM | POA: Diagnosis not present

## 2018-11-26 DIAGNOSIS — R350 Frequency of micturition: Secondary | ICD-10-CM

## 2018-11-26 DIAGNOSIS — R3589 Other polyuria: Secondary | ICD-10-CM

## 2018-11-26 DIAGNOSIS — R358 Other polyuria: Secondary | ICD-10-CM

## 2018-11-26 DIAGNOSIS — I10 Essential (primary) hypertension: Secondary | ICD-10-CM

## 2018-11-26 MED ORDER — GLUCOSE BLOOD VI STRP: ORAL_STRIP | 0 refills | Status: DC

## 2018-11-26 MED ORDER — BLOOD GLUCOSE MONITORING SUPPL DEVI: 0 refills | Status: DC

## 2018-11-26 MED ORDER — ACCU-CHEK SOFT TOUCH LANCETS MISC: 12 refills | Status: DC

## 2018-11-26 NOTE — Patient Instructions (Addendum)
Monitor your blood gluose three times a day!  Check first thing in the morning before breakfast, two hours after lunch, before dinner and before bedtime.  Metformin XL 500mg  take two tablets in the morning and two at night.  Take these with your meals.  Breakfast and dinner.    Check you blood glucose 4 times a day and follow up on Thursday.  You have made some healthy changes you just need to eat three meals a day and a snack of those healthy options.  DO NOT SKIP MEALS.  Diabetes Mellitus and Nutrition, Adult When you have diabetes (diabetes mellitus), it is very important to have healthy eating habits because your blood sugar (glucose) levels are greatly affected by what you eat and drink. Eating healthy foods in the appropriate amounts, at about the same times every day, can help you:  Control your blood glucose.  Lower your risk of heart disease.  Improve your blood pressure.  Reach or maintain a healthy weight. Every person with diabetes is different, and each person has different needs for a meal plan. Your health care provider may recommend that you work with a diet and nutrition specialist (dietitian) to make a meal plan that is best for you. Your meal plan may vary depending on factors such as:  The calories you need.  The medicines you take.  Your weight.  Your blood glucose, blood pressure, and cholesterol levels.  Your activity level.  Other health conditions you have, such as heart or kidney disease. How do carbohydrates affect me? Carbohydrates, also called carbs, affect your blood glucose level more than any other type of food. Eating carbs naturally raises the amount of glucose in your blood. Carb counting is a method for keeping track of how many carbs you eat. Counting carbs is important to keep your blood glucose at a healthy level, especially if you use insulin or take certain oral diabetes medicines. It is important to know how many carbs you can safely have  in each meal. This is different for every person. Your dietitian can help you calculate how many carbs you should have at each meal and for each snack. Foods that contain carbs include:  Bread, cereal, rice, pasta, and crackers.  Potatoes and corn.  Peas, beans, and lentils.  Milk and yogurt.  Fruit and juice.  Desserts, such as cakes, cookies, ice cream, and candy. How does alcohol affect me? Alcohol can cause a sudden decrease in blood glucose (hypoglycemia), especially if you use insulin or take certain oral diabetes medicines. Hypoglycemia can be a life-threatening condition. Symptoms of hypoglycemia (sleepiness, dizziness, and confusion) are similar to symptoms of having too much alcohol. If your health care provider says that alcohol is safe for you, follow these guidelines:  Limit alcohol intake to no more than 1 drink per day for nonpregnant women and 2 drinks per day for men. One drink equals 12 oz of beer, 5 oz of wine, or 1 oz of hard liquor.  Do not drink on an empty stomach.  Keep yourself hydrated with water, diet soda, or unsweetened iced tea.  Keep in mind that regular soda, juice, and other mixers may contain a lot of sugar and must be counted as carbs. What are tips for following this plan?  Reading food labels  Start by checking the serving size on the "Nutrition Facts" label of packaged foods and drinks. The amount of calories, carbs, fats, and other nutrients listed on the label is based on  one serving of the item. Many items contain more than one serving per package.  Check the total grams (g) of carbs in one serving. You can calculate the number of servings of carbs in one serving by dividing the total carbs by 15. For example, if a food has 30 g of total carbs, it would be equal to 2 servings of carbs.  Check the number of grams (g) of saturated and trans fats in one serving. Choose foods that have low or no amount of these fats.  Check the number of  milligrams (mg) of salt (sodium) in one serving. Most people should limit total sodium intake to less than 2,300 mg per day.  Always check the nutrition information of foods labeled as "low-fat" or "nonfat". These foods may be higher in added sugar or refined carbs and should be avoided.  Talk to your dietitian to identify your daily goals for nutrients listed on the label. Shopping  Avoid buying canned, premade, or processed foods. These foods tend to be high in fat, sodium, and added sugar.  Shop around the outside edge of the grocery store. This includes fresh fruits and vegetables, bulk grains, fresh meats, and fresh dairy. Cooking  Use low-heat cooking methods, such as baking, instead of high-heat cooking methods like deep frying.  Cook using healthy oils, such as olive, canola, or sunflower oil.  Avoid cooking with butter, cream, or high-fat meats. Meal planning  Eat meals and snacks regularly, preferably at the same times every day. Avoid going long periods of time without eating.  Eat foods high in fiber, such as fresh fruits, vegetables, beans, and whole grains. Talk to your dietitian about how many servings of carbs you can eat at each meal.  Eat 4-6 ounces (oz) of lean protein each day, such as lean meat, chicken, fish, eggs, or tofu. One oz of lean protein is equal to: ? 1 oz of meat, chicken, or fish. ? 1 egg. ?  cup of tofu.  Eat some foods each day that contain healthy fats, such as avocado, nuts, seeds, and fish. Lifestyle  Check your blood glucose regularly.  Exercise regularly as told by your health care provider. This may include: ? 150 minutes of moderate-intensity or vigorous-intensity exercise each week. This could be brisk walking, biking, or water aerobics. ? Stretching and doing strength exercises, such as yoga or weightlifting, at least 2 times a week.  Take medicines as told by your health care provider.  Do not use any products that contain nicotine  or tobacco, such as cigarettes and e-cigarettes. If you need help quitting, ask your health care provider.  Work with a Social worker or diabetes educator to identify strategies to manage stress and any emotional and social challenges. Questions to ask a health care provider  Do I need to meet with a diabetes educator?  Do I need to meet with a dietitian?  What number can I call if I have questions?  When are the best times to check my blood glucose? Where to find more information:  American Diabetes Association: diabetes.org  Academy of Nutrition and Dietetics: www.eatright.[employer] of Diabetes and Digestive and Kidney Diseases (NIH): [employer] Summary  A healthy meal plan will help you control your blood glucose and maintain a healthy lifestyle.  Working with a diet and nutrition specialist (dietitian) can help you make a meal plan that is best for you.  Keep in mind that carbohydrates (carbs) and alcohol have immediate effects on your  blood glucose levels. It is important to count carbs and to use alcohol carefully. This information is not intended to replace advice given to you by your health care provider. Make sure you discuss any questions you have with your health care provider. Document Released: 11/24/2004 Document Revised: 02/09/2017 Document Reviewed: 04/03/2016 Elsevier Patient Education  2020 [employer].

## 2018-11-26 NOTE — Progress Notes (Signed)
Assessment and Plan:  Charles Harrell was seen today for follow-up, urinary frequency and polydipsia.  Discussed with patient signs and symptoms of hyperglycemia and hospital precautions.  Discussed likely glucose elevation related to not eating three meals a day, diet high in fruit.  Lowered glucose in office and discussed importance of eating three meals a day and checking glucose AC & HS along with food log for next two days with close follow up.  Consider adding Diagnoses and all orders for this visit:  Frequency of urination and polyuria Likely from hyperglycemia Will continue to monitor  Essential hypertension Elevated today Monitors at home, has not taken medication for the day. Continue to monitor  Type 2 diabetes mellitus with hyperosmolarity without coma, without long-term current use of insulin (HCC) -     Glutamic acid decarboxylase auto abs Change the way you take MetforminXL 500mg . NOW take two tablets twice a day  that is 2,000mg  daily. Provided Humalog sample pen with needles with sliding scale to be given WITH food.  Discussed with at length  Hyperglycemia Monitor blood glucose AC & HS Education provded in office on glucometer And glucose goal, under 200 at this point Close follow up in two days with glucose and diet log Should improve after dietary changes. Provided hospital precautions and when to contact office Encouraged MyChart sign up  Encounter for injection education Education provided with teach back Follow up for further diabetes teaching related to diet May have to add second oral agent?  Other orders -     Discontinue: glucose blood test strip; Use as instructed -   -CHEK SOFT TOUCH) lancets; Use as instructed -     Blood Glucose Monitoring Suppl DEVI; Check glucose 4 times a day and PRN. -     glucose blood test strip; Use as instructed   Lancets (ACCU  Extended time spent with patient coordinating care and educating on current disease state and  management.   Further disposition pending results of labs. Discussed med's effects and SE's.   Over 40 minutes of exam, counseling, chart review, and critical decision making was performed.   Future Appointments  Date Time Provider [place]  11/28/2018  2:30 PM Garnet Sierras, NP [facility] None  02/25/2019 11:00 AM Unk Pinto, MD [facility] None    ------------------------------------------------------------------------------------------------------------------   HPI 48 y.o.male presents for acute visit for extreme increase in urination, excessive thirst, blurry vision for the past three weeks. Reports the blurry vision was constant for few days but then intermittent.  It has resolved and not had any vision symptoms past couple of days. He has been drinking lots of water, gallon and half, 200oz to help to quench his thirst but he remains thirsty.  He denies any dysuria, abdominal pain, any GI symptoms, chest pains or shortness of breath.  Denies any syncopal episodes.  He has diagnosis of DMII and taking metformin, two tablet in the morning and one tablet in the evening.  Reports that he does not take his medication with food.    Upon arrival glucose was check and 446.  Patient is alert and oriented, denies any symptoms other than the above.  In office administered Humalog 8units, SQ right upper arm at 1012.  Three months ago he started working on his diet four weeks ago.  He just started changing the foods that he was eating.  Below is a sample of his eating  Breakfast: Banana or nothing at all  Lunch: Will not eat lunch if he does  it would be around 1-2 o'clock.  He would eat a single salsa, oil & vinegar dressing.  Sometimes a sandwich on wheat bread.  He reports as snack he eats fruit all throughout the day.  Dinner: timing 5;30-6pm  Report she will eat soup, vegetable soup, Progresso low sodium soup.  He also has salad   1036 Glucose recheck 420 1130 Glucose  recheck 370, 4 units SQ abdomen, administered by patient after teaching.      Past Medical History:  Diagnosis Date  . Hyperlipidemia   . Hypertension   . Morbid obesity ([place])   . Plantar fasciitis   . Prediabetes   . Vitamin D deficiency      No Known Allergies  Current Outpatient Medications on File Prior to Visit  Medication Sig  . bisoprolol-hydrochlorothiazide (ZIAC) 5-6.25 MG tablet Take 1 tablet daily for BP  . Cholecalciferol (VITAMIN D3) 125 MCG (5000 UT) CHEW Chew by mouth. Take two chews daily  . metFORMIN (GLUCOPHAGE-XR) 500 MG 24 hr tablet Take 1 tablet by mouth twice a day with meals for Diabetes. (Patient taking differently: Take two in the morning and one in the evening)  . Multiple Vitamin (MULTIVITAMIN) capsule Take 1 capsule by mouth daily.  Marland Kitchen olmesartan (BENICAR) 40 MG tablet Take 1/2 to 1 tablet at night for BP  . rosuvastatin (CRESTOR) 40 MG tablet Take  1 tablet daily  for Cholesterol (Patient taking differently: Take  1 tablet every other day  for Cholesterol)  . TURMERIC PO Take by mouth daily.  . phentermine (ADIPEX-P) 37.5 MG tablet Take 1/2  to 1 tablet every Morning for Dieting & Weight Loss   No current facility-administered medications on file prior to visit.     ROS: all negative except above.   Physical Exam:  BP (!) 142/90   Pulse 89   Temp (!) 97.5 F (36.4 C)   Wt 277 lb 9.6 oz (125.9 kg)   SpO2 96%   BMI 37.13 kg/m   General Appearance: Well nourished, in no apparent distress. Eyes: PERRLA, EOMs, conjunctiva no swelling or erythema Sinuses: No Frontal/maxillary tenderness ENT/Mouth: Ext aud canals clear, TMs without erythema, bulging. No erythema, swelling, or exudate on post pharynx.  Tonsils not swollen or erythematous. Hearing normal.  Neck: Supple, thyroid normal.  Respiratory: Respiratory effort normal, BS equal bilaterally without rales, rhonchi, wheezing or stridor.  Cardio: RRR with no MRGs. Brisk peripheral pulses  without edema.  Abdomen: Soft, + BS.  Non tender, no guarding, rebound, hernias, masses. Lymphatics: Non tender without lymphadenopathy.  Musculoskeletal: Full ROM, 5/5 strength, normal gait.  Skin: Warm, dry without rashes, lesions, ecchymosis.  Neuro: Cranial nerves intact. Normal muscle tone, no cerebellar symptoms. Sensation intact.  Psych: Awake and oriented X 3, normal affect, Insight and Judgment appropriate.     Garnet Sierras, NP 11:30AM [facility]

## 2018-11-27 NOTE — Progress Notes (Signed)
Diabetes Education and Follow-Up Visit  48 y.o.male presents for diabetic education.  Last visit two days ago he had hyperglycemia in the 400's and was given sliding scale short acting, Humalog, insulin.  He was instructed to give this right before he is about to eat meal.  He reports no hcange in his appetitie.  His blood glucose readings , fasting range from 200-340, lunch range 350-400's and dinner range of 290-400.  He has given himself sliding scale humalog insulin with every meal anywhere from 8-10 units.   He has Diabetes Mellitus type 2:  without complications, he is not on bASA, and denies nausea, paresthesia of the feet, polydipsia, polyuria, visual disturbances, vomiting and weight loss.  Reports that he feels much better from two days ago.  Polyuria has decreased dramatically.  Discussed S&S of HYPER and HYPOglycemia.  Last hemoglobin A1c was: Lab Results  Component Value Date   HGBA1C >14.0 (H) 11/28/2018   HGBA1C 7.5 (H) 08/29/2018   HGBA1C 6.7 (H) 05/24/2018    There is no height or weight on file to calculate BMI.  Pt is on a regimen of: Glucophage XR 500mg , two tablets twice a day  Pt checks his sugars 4 x day  Lowest sugar was 200.  He has hypoglycemia awareness.  Highest sugar was 450.  Glucometer: He picked this up yesterday and was previously using glucometer sample from the office.  Exercise: He is not engaging in purposeful exercise at this time.  Will encourage further once glucose is further controlled.  Patient does not have CKD He is on ACE/ARB : Benicar 40mg  daily   Lab Results  Component Value Date   GFRAA 98 11/28/2018      Lab Results  Component Value Date   GFRNONAA 85 11/28/2018    Lab Results  Component Value Date   CREATININE 1.03 11/28/2018   BUN 21 11/28/2018   NA 128 (L) 11/28/2018   K 4.6 11/28/2018   CL 92 (L) 11/28/2018   CO2 24 11/28/2018    No results found for: MICROALBUR, MALB24HUR   He is on a Statin, Crestor 40mg   nightly. He is at goal of less than 70.  Lab Results  Component Value Date   CHOL 173 08/29/2018   HDL 42 08/29/2018   LDLCALC 94 08/29/2018   TRIG 259 (H) 08/29/2018   CHOLHDL 4.1 08/29/2018     Problem List has Essential hypertension; Hyperlipidemia, mixed; Type 2 diabetes mellitus ([place]); Vitamin D deficiency; Anxiety tension state; Encounter for general adult medical examination with abnormal findings; and Morbid obesity ([place]) on their problem list.  Medications Current Outpatient Medications on File Prior to Visit  Medication Sig  . bisoprolol-hydrochlorothiazide (ZIAC) 5-6.25 MG tablet Take 1 tablet daily for BP  . Blood Glucose Monitoring Suppl DEVI Check glucose 4 times a day and PRN.  Marland Kitchen Cholecalciferol (VITAMIN D3) 125 MCG (5000 UT) CHEW Chew by mouth. Take two chews daily  . glucose blood test strip Use as instructed  . Lancets (ACCU-CHEK SOFT TOUCH) lancets Use as instructed  . metFORMIN (GLUCOPHAGE-XR) 500 MG 24 hr tablet Take 1 tablet by mouth twice a day with meals for Diabetes. (Patient taking differently: Take two in the morning and one in the evening)  . Multiple Vitamin (MULTIVITAMIN) capsule Take 1 capsule by mouth daily.  Marland Kitchen olmesartan (BENICAR) 40 MG tablet Take 1/2 to 1 tablet at night for BP  . phentermine (ADIPEX-P) 37.5 MG tablet Take 1/2  to 1 tablet every Morning  for Dieting & Weight Loss  . rosuvastatin (CRESTOR) 40 MG tablet Take  1 tablet daily  for Cholesterol (Patient taking differently: Take  1 tablet every other day  for Cholesterol)  . TURMERIC PO Take by mouth daily.   No current facility-administered medications on file prior to visit.     ROS- see HPI  Physical Exam: There were no vitals taken for this visit. There is no height or weight on file to calculate BMI. General Appearance: Well nourished, in no apparent distress. Eyes: PERRLA, EOMs, conjunctiva no swelling or erythema ENT/Mouth: Ext aud canals clear, TMs without erythema, bulging.  No erythema, swelling, or exudate on post pharynx.  Tonsils not swollen or erythematous. Hearing normal.  Respiratory: Respiratory effort normal, BS equal bilaterally without rales, rhonchi, wheezing or stridor.  Cardio: RRR with no MRGs. Brisk peripheral pulses without edema.  Abdomen: Soft, + BS.  Non tender, no guarding, rebound, hernias, masses. Musculoskeletal: Full ROM, 5/5 strength, normal gait.  Skin: Warm, dry without rashes, lesions, ecchymosis.  Neuro: Cranial nerves intact. Normal muscle tone, no cerebellar symptoms. Sensation intact.    Plan and Assessment: Diabetes Education: Reviewed 'ABCs' of diabetes management (respective goals in parentheses):  A1C (<7), blood pressure (<130/80), and cholesterol (LDL <70) Eye Exam yearly and Dental Exam every 6 months. Dietary recommendations Physical Activity recommendations - Strongly advised him to continue check glucose AC & HS.  Record readings and insulin given on sugar log and advised to bring it at next appt  - given foot care handout and explained the principles  - given instructions for hypoglycemia management   -Concerned with prolonged uncontrolled hyperglycemia.  Over all there has been a reduction in in glucose readings and he reports that he feels better.  He has clear understanding of the insulin use.  More education provided regarding dietary changes.  His food log is appropriate and discussed food choices and portions. Will check CBC, CMP and A1c today, last A1c7.  Patient to follow up in two weeks, pending lab results.  Consider adding oral agent, may need change in insulin regiment.   Diagnoses and all orders for this visit:  Type 2 diabetes mellitus with hyperosmolarity without coma, without long-term current use of insulin (HCC) -     Urinalysis w microscopic + reflex cultur -     Hemoglobin A1c Humalog sliding scale AC & HS Continue monitoring food intake and glucose log Pending lab results will adjust  regiment  Hyperglycemia Monitor for S&S Discussed hospital precautions  Hyperlipidemia, mixed Cholesterol Continue medications: Continue low cholesterol diet and exercise.  Check lipid panel.   Essential hypertension -     Urinalysis w microscopic + reflex cultur -     CBC with Differential/Platelet -     COMPLETE METABOLIC PANEL WITH GFR  Class 3 severe obesity due to excess calories with serious comorbidity and body mass index (BMI) of 40.0 to 44.9 in adult [facility]) Discussed dietary and exercise modifications Glucose control is focus at this time to aide in weight loss.  Other orders -     REFLEXIVE URINE CULTURE    Patient is agreeable to plan of care.  Will contact with lab results.   Future Appointments  Date Time Provider [place]  02/25/2019 11:00 AM Unk Pinto, MD [facility] None    Garnet Sierras, NP [facility] Adolescent [facility] 11/28/2018  3:30PM

## 2018-11-27 NOTE — Patient Instructions (Signed)
You have Faxiga samples, do not start these until I contact you with the labs.   Focus on eating lean protein, high-fiber, less processed carbs, fruits(in moderation), and vegetables, low-fat dairy, and healthy vegetable-based fats such as avocado, nuts, canola oil, or olive oil.   Blood sugar control and a healthy weight should be your goal.  With main meals containing up to 30 grams of fiber-rich carbohydrates, and snacks containing 10-20 grams of fiber-rich carbohydrates.  Men are typically more physically active and should start with a 2,000-2,200 calorie meal plan, in which you may increase carbohydrates proportional to your activities.  Recent research suggests that by eating a big breakfast, and a modest lunch, so you get most of your calories in by 3 pm, you will find it easier to lose weight and achieve better blood sugar control.  A high fiber diet-one that contains at least 25 to 35 grams of dietary fiber a day-is essential for good health, and is the key for people with diabetes because fiber helps slow down the absorption of all sugars-those that are naturally forming like in fruits and starches, as well as any refined sugars you consume-in your bloodstream.         Create well-balanced meals (including protein, fat and fiber-rich carbs), they are generally more satisfying.  Doing this decreses chances of being hungry between meals so less likely to look for a quick fix that will cause your blood sugar to soar, and your body to store those unneeded calories as fat.  Good sources of fiber include:      Pulses (like lentils and peas) and beans and legumes (think black beans, kidney beans, pintos, chickpeas and white beans)      Fruits and vegetables, especially those with edible skin (like apples and beans) and those with edible seeds (like berries)     Nuts-try different kinds (peanuts, walnuts and almonds are a good source of fiber and healthy fat, but watch portion sizes, because  they also contain a lot of calories in a small amount!)     Whole grains such as:         Quinoa, barley, brown rice and farro         Whole wheat pasta         Whole grain cereals, including those made from whole wheat, wheat bran and oats   What foods contain heart-healthy fats? These include olive oil and oils made from nuts (eg, walnut oil, peanut oil), avocado, fatty fish l(eg, [place] salmon, mackerel, herring, and [place] trout), nuts and seeds.  The key to a balanced diet is planning meals using the diabetes plate method-divide the plate into quarters:  protein or meat, 1/4 carbs, and 2/4 (=1/2) vegetable and fruit.6 If you want to lose weight, use 9-inch dinner plates and bowls so you aren't piling the food on to a large dinner plate.  For example, fill half the plate with non-starchy carbs such as salad greens or steamed broccoli, and fill the remaining half of the plate with equal portions of a grain or starchy vegetable like mashed sweet potato, and a heart-healthy protein such as broiled salmon.   Diabetes Plate Method  Check out the site below for more detailed information and examples.  [employer]

## 2018-11-28 ENCOUNTER — Other Ambulatory Visit: Payer: Self-pay

## 2018-11-28 ENCOUNTER — Encounter: Payer: Self-pay | Admitting: Adult Health Nurse Practitioner

## 2018-11-28 ENCOUNTER — Ambulatory Visit (INDEPENDENT_AMBULATORY_CARE_PROVIDER_SITE_OTHER): Payer: Managed Care, Other (non HMO) | Admitting: Adult Health Nurse Practitioner

## 2018-11-28 DIAGNOSIS — E782 Mixed hyperlipidemia: Secondary | ICD-10-CM

## 2018-11-28 DIAGNOSIS — I10 Essential (primary) hypertension: Secondary | ICD-10-CM

## 2018-11-28 DIAGNOSIS — E1100 Type 2 diabetes mellitus with hyperosmolarity without nonketotic hyperglycemic-hyperosmolar coma (NKHHC): Secondary | ICD-10-CM

## 2018-11-28 DIAGNOSIS — R739 Hyperglycemia, unspecified: Secondary | ICD-10-CM

## 2018-11-28 DIAGNOSIS — Z6841 Body Mass Index (BMI) 40.0 and over, adult: Secondary | ICD-10-CM

## 2018-11-29 ENCOUNTER — Other Ambulatory Visit: Payer: Self-pay | Admitting: Internal Medicine

## 2018-11-29 LAB — CBC WITH DIFFERENTIAL/PLATELET
Absolute Monocytes: 748 cells/uL (ref 200–950)
Basophils Absolute: 50 cells/uL (ref 0–200)
Basophils Relative: 0.6 %
Eosinophils Absolute: 160 cells/uL (ref 15–500)
Eosinophils Relative: 1.9 %
HCT: 41.4 % (ref 38.5–50.0)
Hemoglobin: 13.8 g/dL (ref 13.2–17.1)
Lymphs Abs: 2243 cells/uL (ref 850–3900)
MCH: 30.3 pg (ref 27.0–33.0)
MCHC: 33.3 g/dL (ref 32.0–36.0)
MCV: 91 fL (ref 80.0–100.0)
MPV: 12.2 fL (ref 7.5–12.5)
Monocytes Relative: 8.9 %
Neutro Abs: 5200 cells/uL (ref 1500–7800)
Neutrophils Relative %: 61.9 %
Platelets: 211 10*3/uL (ref 140–400)
RBC: 4.55 10*6/uL (ref 4.20–5.80)
RDW: 13.6 % (ref 11.0–15.0)
Total Lymphocyte: 26.7 %
WBC: 8.4 10*3/uL (ref 3.8–10.8)

## 2018-11-29 LAB — COMPLETE METABOLIC PANEL WITH GFR
AG Ratio: 1.3 (calc) (ref 1.0–2.5)
ALT: 77 U/L — ABNORMAL HIGH (ref 9–46)
AST: 51 U/L — ABNORMAL HIGH (ref 10–40)
Albumin: 3.9 g/dL (ref 3.6–5.1)
Alkaline phosphatase (APISO): 129 U/L (ref 36–130)
BUN: 21 mg/dL (ref 7–25)
CO2: 24 mmol/L (ref 20–32)
Calcium: 9.4 mg/dL (ref 8.6–10.3)
Chloride: 92 mmol/L — ABNORMAL LOW (ref 98–110)
Creat: 1.03 mg/dL (ref 0.60–1.35)
GFR, Est African American: 98 mL/min/{1.73_m2} (ref >=60)
GFR, Est Non African American: 85 mL/min/{1.73_m2} (ref >=60)
Globulin: 3 g/dL (calc) (ref 1.9–3.7)
Glucose, Bld: 545 mg/dL (ref 65–99)
Potassium: 4.6 mmol/L (ref 3.5–5.3)
Sodium: 128 mmol/L — ABNORMAL LOW (ref 135–146)
Total Bilirubin: 0.5 mg/dL (ref 0.2–1.2)
Total Protein: 6.9 g/dL (ref 6.1–8.1)

## 2018-11-29 LAB — URINALYSIS W MICROSCOPIC + REFLEX CULTURE
Bacteria, UA: NONE SEEN /HPF
Bilirubin Urine: NEGATIVE
Hgb urine dipstick: NEGATIVE
Hyaline Cast: NONE SEEN /LPF
Ketones, ur: NEGATIVE
Leukocyte Esterase: NEGATIVE
Nitrites, Initial: NEGATIVE
RBC / HPF: NONE SEEN /HPF (ref 0–2)
Specific Gravity, Urine: 1.033 (ref 1.001–1.03)
Squamous Epithelial / HPF: NONE SEEN /HPF (ref <=5)
WBC, UA: NONE SEEN /HPF (ref 0–5)
pH: <=5 (ref 5.0–8.0)

## 2018-11-29 LAB — HEMOGLOBIN A1C: Hgb A1c MFr Bld: >14 % of total Hgb — ABNORMAL HIGH (ref <5.7)

## 2018-11-29 LAB — NO CULTURE INDICATED

## 2018-11-29 LAB — GLUTAMIC ACID DECARBOXYLASE AUTO ABS: Glutamic Acid Decarb Ab: <5 IU/mL (ref <5)

## 2018-12-03 ENCOUNTER — Encounter: Payer: Self-pay | Admitting: Adult Health Nurse Practitioner

## 2018-12-03 ENCOUNTER — Other Ambulatory Visit: Payer: Self-pay

## 2018-12-03 ENCOUNTER — Ambulatory Visit (INDEPENDENT_AMBULATORY_CARE_PROVIDER_SITE_OTHER): Payer: Managed Care, Other (non HMO) | Admitting: Adult Health Nurse Practitioner

## 2018-12-03 VITALS — BP 116/70 | HR 69 | Temp 97.6°F | Ht 72.5 in | Wt 286.0 lb

## 2018-12-03 DIAGNOSIS — Z6841 Body Mass Index (BMI) 40.0 and over, adult: Secondary | ICD-10-CM

## 2018-12-03 DIAGNOSIS — R739 Hyperglycemia, unspecified: Secondary | ICD-10-CM | POA: Diagnosis not present

## 2018-12-03 DIAGNOSIS — E782 Mixed hyperlipidemia: Secondary | ICD-10-CM

## 2018-12-03 DIAGNOSIS — I10 Essential (primary) hypertension: Secondary | ICD-10-CM | POA: Diagnosis not present

## 2018-12-03 DIAGNOSIS — E1100 Type 2 diabetes mellitus with hyperosmolarity without nonketotic hyperglycemic-hyperosmolar coma (NKHHC): Secondary | ICD-10-CM | POA: Diagnosis not present

## 2018-12-03 NOTE — Progress Notes (Signed)
Diabetes Education and Follow-Up Visit  48 y.o.male presents for diabetic education.  Last visit two days ago he had hyperglycemia in the 400's and was given sliding scale short acting, Humalog, insulin.  He was instructed to give this right before he is about to eat meal.  He reports no hcange in his appetitie.  His blood glucose readings , fasting range from 155-380, lunch range 200-'s and dinner range of 290-400.  He has started using 25-35 units in the morning and 25-35 in the evening.    He has Diabetes Mellitus type 2:  without complications, he is not on bASA, and denies nausea, paresthesia of the feet, polydipsia, polyuria, visual disturbances, vomiting and weight loss.  Reports that he feels much better over all and no further vision impairment. Polyuria has decreased dramatically as well as the polydipsia.  Discussed S&S of HYPER and HYPOglycemia.  Last hemoglobin A1c was: Lab Results  Component Value Date   HGBA1C >14.0 (H) 11/28/2018   HGBA1C 7.5 (H) 08/29/2018   HGBA1C 6.7 (H) 05/24/2018    Body mass index is 38.26 kg/m.  Pt is on a regimen of: Glucophage XR 500mg , two tablets twice a day  Pt checks his sugars AC & HS Lowest sugar was 155.  He has hypoglycemia awareness.  Highest sugar was 380.  Glucometer: He picked this up yesterday and was previously using glucometer sample from the office.  Exercise: He is not engaging in purposeful exercise at this time.  Will encourage further once glucose is further controlled.  Patient does not have CKD He is on ACE/ARB : Benicar 40mg  daily   Lab Results  Component Value Date   GFRAA 98 11/28/2018      Lab Results  Component Value Date   GFRNONAA 85 11/28/2018    Lab Results  Component Value Date   CREATININE 1.03 11/28/2018   BUN 21 11/28/2018   NA 128 (L) 11/28/2018   K 4.6 11/28/2018   CL 92 (L) 11/28/2018   CO2 24 11/28/2018    No results found for: MICROALBUR, MALB24HUR   He is on a Statin, Crestor 40mg   nightly. He is at goal of less than 70.  Lab Results  Component Value Date   CHOL 173 08/29/2018   HDL 42 08/29/2018   LDLCALC 94 08/29/2018   TRIG 259 (H) 08/29/2018   CHOLHDL 4.1 08/29/2018     Problem List has Essential hypertension; Hyperlipidemia, mixed; Type 2 diabetes mellitus ([place]); Vitamin D deficiency; Anxiety tension state; Encounter for general adult medical examination with abnormal findings; and Morbid obesity ([place]) on their problem list.  Medications Current Outpatient Medications on File Prior to Visit  Medication Sig  . bisoprolol-hydrochlorothiazide (ZIAC) 5-6.25 MG tablet Take 1 tablet daily for BP  . Blood Glucose Monitoring Suppl DEVI Check glucose 4 times a day and PRN.  Marland Kitchen Cholecalciferol (VITAMIN D3) 125 MCG (5000 UT) CHEW Chew by mouth. Take two chews daily  . glucose blood test strip Use as instructed  . Lancets (ACCU-CHEK SOFT TOUCH) lancets Use as instructed  . metFORMIN (GLUCOPHAGE-XR) 500 MG 24 hr tablet Take 1 tablet by mouth twice a day with meals for Diabetes. (Patient taking differently: Take two in the morning and one in the evening)  . Multiple Vitamin (MULTIVITAMIN) capsule Take 1 capsule by mouth daily.  Marland Kitchen olmesartan (BENICAR) 40 MG tablet Take 1/2 to 1 tablet at night for BP  . phentermine (ADIPEX-P) 37.5 MG tablet Take 1/2  to 1 tablet every  Morning for Dieting & Weight Loss  . rosuvastatin (CRESTOR) 40 MG tablet Take  1 tablet daily  for Cholesterol (Patient taking differently: Take  1 tablet every other day  for Cholesterol)  . TURMERIC PO Take by mouth daily.   No current facility-administered medications on file prior to visit.     ROS- see HPI  Physical Exam: Blood pressure 116/70, pulse 69, temperature 97.6 F (36.4 C), height 6' 0.5" (1.842 m), weight 286 lb (129.7 kg), SpO2 98 %. Body mass index is 38.26 kg/m. General Appearance: Well nourished, in no apparent distress. Eyes: PERRLA, EOMs, conjunctiva no swelling or  erythema ENT/Mouth: Ext aud canals clear, TMs without erythema, bulging. No erythema, swelling, or exudate on post pharynx.  Tonsils not swollen or erythematous. Hearing normal.  Respiratory: Respiratory effort normal, BS equal bilaterally without rales, rhonchi, wheezing or stridor.  Cardio: RRR with no MRGs. Brisk peripheral pulses without edema.  Abdomen: Soft, + BS.  Non tender, no guarding, rebound, hernias, masses. Musculoskeletal: Full ROM, 5/5 strength, normal gait.  Skin: Warm, dry without rashes, lesions, ecchymosis.  Neuro: Cranial nerves intact. Normal muscle tone, no cerebellar symptoms. Sensation intact.    Plan and Assessment: Diabetes Education: Reviewed 'ABCs' of diabetes management (respective goals in parentheses):  A1C (<7), blood pressure (<130/80), and cholesterol (LDL <70) Eye Exam yearly and Dental Exam every 6 months. Dietary recommendations Physical Activity recommendations - Continue checking glucose AC & HS.  Record readings and insulin and meals on log and advised to bring it at next appt  - given foot care handout and explained the principles  - given instructions for hypoglycemia management   - Over all there has been a reduction in in glucose readings and he reports that he feels better.  He has clear understanding of the insulin use.  More education provided regarding dietary changes and adjustment of insluin.  His food log is appropriate and discussed food choices and portions. Patient to follow up in two weeks.  Will continue close follow up based on severity of previous hypoglycemia.  Diagnoses and all orders for this visit:  Type 2 diabetes mellitus with hyperosmolarity without coma, without long-term current use of insulin (HCC) -     Urinalysis w microscopic + reflex cultur -     Hemoglobin A1c Check blood glucose AC & HS Inject 35units of 70/30 insluin in am, IF fasting over 180 increase to 40 units every AM.  Fasting goal 90-120. Continue  monitoring food intake and glucose log Evening inject 30 units every evening with meal.  Hyperglycemia Monitor for S&S Discussed hospital precautions  Hyperlipidemia, mixed Cholesterol Continue medications: Crestor 40mg  nightly Continue low cholesterol diet and exercise.    Essential hypertension Continue current medications: Benicar 40mg  Monitor blood pressure at home; call if consistently over 130/80 Continue DASH diet.   Reminder to go to the ER if any CP, SOB, nausea, dizziness, severe HA, changes vision/speech, left arm numbness and tingling and jaw pain.   Class 3 severe obesity due to excess calories with serious comorbidity and body mass index (BMI) of 40.0 to 44.9 in adult [facility]) Discussed dietary and exercise modifications Glucose control is focus at this time to aide in weight loss.    Patient is agreeable to plan of care and to follow up in two weeks for review of glucose log.  Future Appointments  Date Time Provider [place]  12/17/2018  2:30 PM Garnet Sierras, NP [facility] None  02/25/2019 11:00 AM Unk Pinto,  MD [facility] None    Garnet Sierras, NP [facility] Adolescent Internal Medicine 12/03/2018  3:50PM

## 2018-12-03 NOTE — Patient Instructions (Addendum)
     35 units in the morning  Good glucose range for fasting glucose is 90-120  IF you glucose is over 180 or higher increase to 40units every morning    30units in the evening, adjust based on your glucose.   Main goal is to have glucose readings below 200   Follow up in two weeks with glucose log  Sign up for MyChart  [place] three meals a day and snacks filled with protein.

## 2018-12-12 LAB — HM DIABETES EYE EXAM

## 2018-12-16 NOTE — Progress Notes (Signed)
Diabetes Education and Follow-Up Visit  48 y.o.male presents for diabetic education and evaluation of glucose and food logs.  Last visit two weeks ago he had hyperglycemia in the 300's.  He is taking metformin 500mg  two tablets twice a day and 70/30 insulin injection 35 units in am and 30 in evening.  He is giving these with meals.  His blood glucose readings , fasting range improved from from 155-380, 100-168,  dinner range improved from 290-400 to 180-130's  He has started using 35 units in the morning and 30 in the evening.  His average 14 day glucose is 128. He has Diabetes Mellitus type 2:  without complications, he is not on bASA, and denies nausea, paresthesia of the feet, polydipsia, polyuria, visual disturbances, vomiting and weight loss.  Reports that he feels much better over all and no further vision impairment and completed his diabetic eye exam on 12/12/18. Polyuria has decreased dramatically as well as the polydipsia.  Discussed S&S of HYPER and HYPOglycemia.  Last hemoglobin A1c was: Lab Results  Component Value Date   HGBA1C >14.0 (H) 11/28/2018   HGBA1C 7.5 (H) 08/29/2018   HGBA1C 6.7 (H) 05/24/2018    There is no height or weight on file to calculate BMI.  Pt is on a regimen of: Glucophage XR 500mg , two tablets twice a day  Pt checks his sugars AC & HS Lowest sugar was 78.  He has hypoglycemia awareness.  Highest sugar was 168.  Glucometer: He picked this up yesterday and was previously using glucometer sample from the office.  Exercise: He is not engaging in purposeful exercise at this time.  Will encourage further once glucose is further controlled.  Patient does not have CKD He is on ACE/ARB : Benicar 40mg  daily   Lab Results  Component Value Date   GFRAA 98 11/28/2018      Lab Results  Component Value Date   GFRNONAA 85 11/28/2018    Lab Results  Component Value Date   CREATININE 1.03 11/28/2018   BUN 21 11/28/2018   NA 128 (L) 11/28/2018   K 4.6  11/28/2018   CL 92 (L) 11/28/2018   CO2 24 11/28/2018    No results found for: MICROALBUR, [id number]   He is on a Statin, Crestor 40mg  nightly. He is at goal of less than 70.  Lab Results  Component Value Date   CHOL 173 08/29/2018   HDL 42 08/29/2018   LDLCALC 94 08/29/2018   TRIG 259 (H) 08/29/2018   CHOLHDL 4.1 08/29/2018     Problem List has Essential hypertension; Hyperlipidemia, mixed; Type 2 diabetes mellitus ([place]); Vitamin D deficiency; Anxiety tension state; Encounter for general adult medical examination with abnormal findings; and Morbid obesity ([place]) on their problem list.  Medications Current Outpatient Medications on File Prior to Visit  Medication Sig  . bisoprolol-hydrochlorothiazide (ZIAC) 5-6.25 MG tablet Take 1 tablet daily for BP  . Blood Glucose Monitoring Suppl DEVI Check glucose 4 times a day and PRN.  Marland Kitchen Cholecalciferol (VITAMIN D3) 125 MCG (5000 UT) CHEW Chew by mouth. Take two chews daily  . glucose blood test strip Use as instructed  . Lancets (ACCU-CHEK SOFT TOUCH) lancets Use as instructed  . metFORMIN (GLUCOPHAGE-XR) 500 MG 24 hr tablet Take 1 tablet by mouth twice a day with meals for Diabetes. (Patient taking differently: Take two in the morning and one in the evening)  . Multiple Vitamin (MULTIVITAMIN) capsule Take 1 capsule by mouth daily.  Marland Kitchen olmesartan (BENICAR)  40 MG tablet Take 1/2 to 1 tablet at night for BP  . phentermine (ADIPEX-P) 37.5 MG tablet Take 1/2  to 1 tablet every Morning for Dieting & Weight Loss  . rosuvastatin (CRESTOR) 40 MG tablet Take  1 tablet daily  for Cholesterol (Patient taking differently: Take  1 tablet every other day  for Cholesterol)  . TURMERIC PO Take by mouth daily.   No current facility-administered medications on file prior to visit.     ROS- see HPI  Physical Exam: There were no vitals taken for this visit. There is no height or weight on file to calculate BMI. General Appearance: Well nourished, in no  apparent distress. Eyes: PERRLA, EOMs, conjunctiva no swelling or erythema ENT/Mouth: Ext aud canals clear, TMs without erythema, bulging. No erythema, swelling, or exudate on post pharynx.  Tonsils not swollen or erythematous. Hearing normal.  Respiratory: Respiratory effort normal, BS equal bilaterally without rales, rhonchi, wheezing or stridor.  Cardio: RRR with no MRGs. Brisk peripheral pulses without edema.  Abdomen: Soft, + BS.  Non tender, no guarding, rebound, hernias, masses. Musculoskeletal: Full ROM, 5/5 strength, normal gait.  Skin: Warm, dry without rashes, lesions, ecchymosis.  Neuro: Cranial nerves intact. Normal muscle tone, no cerebellar symptoms. Sensation intact.    Plan and Assessment: Diabetes Education: Reviewed 'ABCs' of diabetes management (respective goals in parentheses):  A1C (<7), blood pressure (<130/80), and cholesterol (LDL <70) Eye Exam yearly and Dental Exam every 6 months. Dietary recommendations Physical Activity recommendations - Continue checking glucose AC & HS.  Record readings and insulin and meals on log and advised to bring it at next appt  - given foot care handout and explained the principles  - given instructions for hypoglycemia management   - Over all there has been a reduction in in glucose readings and he reports that he feels better.  He has clear understanding of the insulin use.  More education provided regarding dietary changes and adjustment of insluin.  His food log is appropriate and discussed food choices and portions. Patient to follow up in two weeks.  Will continue close follow up based on severity of previous hypoglycemia.  Diagnoses and all orders for this visit:  Type 2 diabetes mellitus with hyperosmolarity without coma, without long-term current use of insulin (HCC) Check blood glucose AC & HS Inject 35units of 70/30 insluin in am, IF fasting over 180 increase to 35 units every AM.  Fasting goal 90-120. Continue monitoring  food intake and glucose log Evening inject 30 units every evening with meal. Will check labs next office visit  Hyperglycemia Monitor for S&S Discussed hospital precautions  Hyperlipidemia, mixed Cholesterol Continue medications: Crestor 40mg  nightly Continue low cholesterol diet and exercise Encouraged healthy eating behaviors  Essential hypertension Doing well on current regiment Continue current medications: Benicar 40mg  Monitor blood pressure at home; call if consistently over 130/80 Continue DASH diet.   Reminder to go to the ER if any CP, SOB, nausea, dizziness, severe HA, changes vision/speech, left arm numbness and tingling and jaw pain.   Morbid obesity BMI 39.14 with comorbid conditions Discussed dietary and exercise modifications Glucose control is focus at this time to aide in weight loss. Reports clothes are fitting differently, encouraged to continue tight glucose control.  Has done an excellent job with management of this. Continue to work on portion sizes   Patient is agreeable to plan of care and to follow up for physical in two months or sooner if needed.  Future Appointments  Date Time Provider [place]  12/17/2018  2:30 PM Garnet Sierras, NP [facility] None  02/25/2019 11:00 AM Unk Pinto, MD [facility] None    Garnet Sierras, NP [facility] 12/17/18  3:50PM

## 2018-12-17 ENCOUNTER — Ambulatory Visit (INDEPENDENT_AMBULATORY_CARE_PROVIDER_SITE_OTHER): Payer: Managed Care, Other (non HMO) | Admitting: Adult Health Nurse Practitioner

## 2018-12-17 ENCOUNTER — Encounter: Payer: Self-pay | Admitting: Adult Health Nurse Practitioner

## 2018-12-17 ENCOUNTER — Other Ambulatory Visit: Payer: Self-pay

## 2018-12-17 VITALS — BP 110/70 | Temp 97.3°F | Ht 72.5 in | Wt 292.6 lb

## 2018-12-17 DIAGNOSIS — I10 Essential (primary) hypertension: Secondary | ICD-10-CM | POA: Diagnosis not present

## 2018-12-17 DIAGNOSIS — E782 Mixed hyperlipidemia: Secondary | ICD-10-CM

## 2018-12-17 DIAGNOSIS — R739 Hyperglycemia, unspecified: Secondary | ICD-10-CM

## 2018-12-17 DIAGNOSIS — E1100 Type 2 diabetes mellitus with hyperosmolarity without nonketotic hyperglycemic-hyperosmolar coma (NKHHC): Secondary | ICD-10-CM

## 2018-12-17 MED ORDER — OLMESARTAN MEDOXOMIL 40 MG PO TABS: ORAL_TABLET | ORAL | 1 refills | Status: DC

## 2018-12-17 NOTE — Patient Instructions (Addendum)
   Continue 35 units in the morning  Good glucose range for fasting glucose is 90-120  IF you glucose is over 180 or higher increase to 40units every morning   30units in the evening, adjust based on your glucose.    Follow up in two weeks with glucose log  Sign up for MyChart  [place] three meals a day and snacks filled with protein.  Nuts are good but be sure to eat portion sizes to avoid too much.     Contact us with any questions.  Follow up in December with Dr Melford Aase and labs     [place] what a healthy weight is for you (roughly BMI <25) and aim to maintain this  Aim for 7+ servings of fruits and vegetables daily  70-80+ fluid ounces of water or unsweet tea for healthy kidneys  Limit to max 1 drink of alcohol per day; avoid smoking/tobacco  Limit animal fats in diet for cholesterol and heart health - choose grass fed whenever available  Avoid highly processed foods, and foods high in saturated/trans fats  Aim for low stress - take time to unwind and care for your mental health  Aim for 150 min of moderate intensity exercise weekly for heart health, and weights twice weekly for bone health  Aim for 7-9 hours of sleep daily

## 2019-01-03 ENCOUNTER — Other Ambulatory Visit: Payer: Self-pay | Admitting: Internal Medicine

## 2019-01-03 DIAGNOSIS — E119 Type 2 diabetes mellitus without complications: Secondary | ICD-10-CM

## 2019-01-03 MED ORDER — GLUCOSE BLOOD VI STRP: ORAL_STRIP | 3 refills | Status: DC

## 2019-01-12 ENCOUNTER — Other Ambulatory Visit: Payer: Self-pay | Admitting: Internal Medicine

## 2019-01-12 DIAGNOSIS — I10 Essential (primary) hypertension: Secondary | ICD-10-CM

## 2019-01-12 MED ORDER — OLMESARTAN MEDOXOMIL 40 MG PO TABS: ORAL_TABLET | ORAL | 1 refills | Status: DC

## 2019-01-29 ENCOUNTER — Encounter: Payer: Self-pay | Admitting: Internal Medicine

## 2019-02-13 ENCOUNTER — Other Ambulatory Visit: Payer: Self-pay | Admitting: Internal Medicine

## 2019-02-13 DIAGNOSIS — E782 Mixed hyperlipidemia: Secondary | ICD-10-CM

## 2019-02-18 ENCOUNTER — Encounter: Payer: Self-pay | Admitting: Internal Medicine

## 2019-02-25 ENCOUNTER — Other Ambulatory Visit: Payer: Self-pay

## 2019-02-25 ENCOUNTER — Encounter: Payer: Self-pay | Admitting: Internal Medicine

## 2019-02-25 ENCOUNTER — Ambulatory Visit (INDEPENDENT_AMBULATORY_CARE_PROVIDER_SITE_OTHER): Payer: Managed Care, Other (non HMO) | Admitting: Internal Medicine

## 2019-02-25 VITALS — BP 131/89 | HR 64 | Temp 97.3°F | Resp 18 | Ht 73.0 in | Wt 296.8 lb

## 2019-02-25 DIAGNOSIS — I10 Essential (primary) hypertension: Secondary | ICD-10-CM

## 2019-02-25 DIAGNOSIS — Z1211 Encounter for screening for malignant neoplasm of colon: Secondary | ICD-10-CM

## 2019-02-25 DIAGNOSIS — Z79899 Other long term (current) drug therapy: Secondary | ICD-10-CM | POA: Diagnosis not present

## 2019-02-25 DIAGNOSIS — R5383 Other fatigue: Secondary | ICD-10-CM

## 2019-02-25 DIAGNOSIS — Z13220 Encounter for screening for lipoid disorders: Secondary | ICD-10-CM | POA: Diagnosis not present

## 2019-02-25 DIAGNOSIS — Z87891 Personal history of nicotine dependence: Secondary | ICD-10-CM | POA: Diagnosis not present

## 2019-02-25 DIAGNOSIS — E559 Vitamin D deficiency, unspecified: Secondary | ICD-10-CM | POA: Diagnosis not present

## 2019-02-25 DIAGNOSIS — Z125 Encounter for screening for malignant neoplasm of prostate: Secondary | ICD-10-CM

## 2019-02-25 DIAGNOSIS — N182 Chronic kidney disease, stage 2 (mild): Secondary | ICD-10-CM

## 2019-02-25 DIAGNOSIS — Z1389 Encounter for screening for other disorder: Secondary | ICD-10-CM | POA: Diagnosis not present

## 2019-02-25 DIAGNOSIS — N401 Enlarged prostate with lower urinary tract symptoms: Secondary | ICD-10-CM | POA: Diagnosis not present

## 2019-02-25 DIAGNOSIS — R350 Frequency of micturition: Secondary | ICD-10-CM

## 2019-02-25 DIAGNOSIS — Z8249 Family history of ischemic heart disease and other diseases of the circulatory system: Secondary | ICD-10-CM

## 2019-02-25 DIAGNOSIS — Z131 Encounter for screening for diabetes mellitus: Secondary | ICD-10-CM | POA: Diagnosis not present

## 2019-02-25 DIAGNOSIS — E1169 Type 2 diabetes mellitus with other specified complication: Secondary | ICD-10-CM

## 2019-02-25 DIAGNOSIS — Z111 Encounter for screening for respiratory tuberculosis: Secondary | ICD-10-CM | POA: Diagnosis not present

## 2019-02-25 DIAGNOSIS — E039 Hypothyroidism, unspecified: Secondary | ICD-10-CM

## 2019-02-25 DIAGNOSIS — E1122 Type 2 diabetes mellitus with diabetic chronic kidney disease: Secondary | ICD-10-CM

## 2019-02-25 DIAGNOSIS — Z0001 Encounter for general adult medical examination with abnormal findings: Secondary | ICD-10-CM

## 2019-02-25 DIAGNOSIS — E785 Hyperlipidemia, unspecified: Secondary | ICD-10-CM

## 2019-02-25 DIAGNOSIS — Z130 Encounter for screening for diseases of the blood and blood-forming organs and certain disorders involving the immune mechanism: Secondary | ICD-10-CM

## 2019-02-25 DIAGNOSIS — Z0000 Encounter for general adult medical examination without abnormal findings: Secondary | ICD-10-CM | POA: Diagnosis not present

## 2019-02-25 DIAGNOSIS — Z136 Encounter for screening for cardiovascular disorders: Secondary | ICD-10-CM | POA: Diagnosis not present

## 2019-02-25 MED ORDER — OLMESARTAN MEDOXOMIL 40 MG PO TABS: ORAL_TABLET | ORAL | 1 refills | Status: DC

## 2019-02-25 NOTE — Progress Notes (Signed)
Annual  Screening/Preventative Visit  & Comprehensive Evaluation & Examination     This very nice 48 y.o. DWM presents for a Screening /Preventative Visit & comprehensive evaluation and management of multiple medical co-morbidities.  Patient has been followed for HTN, HLD, T2_NIDDM  and Vitamin D Deficiency.     HTN predates circa 1995. Patient's BP has been controlled at home.  Today's BP is at goal - 131/89. Patient denies any cardiac symptoms as chest pain, palpitations, shortness of breath, dizziness or ankle swelling.     Patient's hyperlipidemia is controlled with diet and medications. Patient denies myalgias or other medication SE's. Last lipids were at goal albeit elevated Trig's:  Lab Results  Component Value Date   CHOL 173 08/29/2018   HDL 42 08/29/2018   LDLCALC 94 08/29/2018   TRIG 259 (H) 08/29/2018   CHOLHDL 4.1 08/29/2018      Patient has hx/o T2_NIDDM (A1c 8.4% / Aug 2018)  and patient denies reactive hypoglycemic symptoms, visual blurring, diabetic polys or paresthesias. Last A1c was >14%  at goal in Sept and he was started on Novolin 70/30 bid. He alleges CBG's are ranging in the low 100's.   Lab Results  Component Value Date   HGBA1C >14.0 (H) 11/28/2018       Finally, patient has history of Vitamin D Deficiency ("21" / 2018)  and last vitamin D was still very low:  Lab Results  Component Value Date   VD25OH 22 (L) 08/29/2018   Current Outpatient Medications on File Prior to Visit  Medication Sig  . bisoprolol-hydrochlorothiazide (ZIAC) 5-6.25 MG tablet Take 1 tablet daily for BP  . Blood Glucose Monitoring Suppl DEVI Check glucose 4 times a day and PRN.  Marland Kitchen Cholecalciferol (VITAMIN D3) 125 MCG (5000 UT) CHEW Chew by mouth. Take two chews daily  . glucose blood test strip Check Blood Sugar 4 x /day  ->  before meals & bedtime  . Lancets (ACCU-CHEK SOFT TOUCH) lancets Use as instructed  . metFORMIN (GLUCOPHAGE-XR) 500 MG 24 hr tablet Take 1 tablet by mouth  twice a day with meals for Diabetes. (Patient taking differently: Take two in the morning and one in the evening)  . Multiple Vitamin (MULTIVITAMIN) capsule Take 1 capsule by mouth daily.  Marland Kitchen OVER THE COUNTER MEDICATION Injects Novolin 70/30 65 units a day.  . rosuvastatin (CRESTOR) 40 MG tablet Take 1 tablet Daily for Cholesterol  . TURMERIC PO Take by mouth daily.  . phentermine (ADIPEX-P) 37.5 MG tablet Take 1/2  to 1 tablet every Morning for Dieting & Weight Loss (Patient not taking: Reported on 02/25/2019)   No current facility-administered medications on file prior to visit.   No Known Allergies Past Medical History:  Diagnosis Date  . Hyperlipidemia   . Hypertension   . Morbid obesity ([place])   . Plantar fasciitis   . Prediabetes   . Vitamin D deficiency    Health Maintenance  Topic Date Due  . HIV Screening  06/07/1984  . INFLUENZA VACCINE  10/12/2018  . HEMOGLOBIN A1C  05/28/2019  . OPHTHALMOLOGY EXAM  12/12/2019  . FOOT EXAM  02/25/2020  . TETANUS/TDAP  03/13/2022  . PNEUMOCOCCAL POLYSACCHARIDE VACCINE AGE 43-64 HIGH RISK  Completed   Immunization History  Administered Date(s) Administered  . Influenza Inj Mdck Quad With Preservative 02/06/2018  . PPD Test 02/06/2018, 02/25/2019  . Pneumococcal Polysaccharide-23 01/16/2019  . Tdap 03/13/2012    Family History  Problem Relation Age of Onset  . Hypertension  Mother   . Hyperlipidemia Mother   . Heart disease Father   . Hyperlipidemia Father   . Hypertension Father   . Early death Father   . Diabetes Father   . COPD Father    Social History   Socioeconomic History  . Marital status: Divorced  . Number of children: 2  Occupational History  . culinary  Tobacco Use  . Smoking status: Former Smoker    Quit date: 05/13/2011    Years since quitting: 7.7  . Smokeless tobacco: Former Network engineer and Sexual Activity  . Alcohol use: Yes    Alcohol/week: 4.0 standard drinks    Types: 4 Glasses of wine per week    . Drug use: Not on file  . Sexual activity: Yes    ROS Constitutional: Denies fever, chills, weight loss/gain, headaches, insomnia,  night sweats or change in appetite. Does c/o fatigue. Eyes: Denies redness, blurred vision, diplopia, discharge, itchy or watery eyes.  ENT: Denies discharge, congestion, post nasal drip, epistaxis, sore throat, earache, hearing loss, dental pain, Tinnitus, Vertigo, Sinus pain or snoring.  Cardio: Denies chest pain, palpitations, irregular heartbeat, syncope, dyspnea, diaphoresis, orthopnea, PND, claudication or edema Respiratory: denies cough, dyspnea, DOE, pleurisy, hoarseness, laryngitis or wheezing.  Gastrointestinal: Denies dysphagia, heartburn, reflux, water brash, pain, cramps, nausea, vomiting, bloating, diarrhea, constipation, hematemesis, melena, hematochezia, jaundice or hemorrhoids Genitourinary: Denies dysuria, frequency, urgency, nocturia, hesitancy, discharge, hematuria or flank pain Musculoskeletal: Denies arthralgia, myalgia, stiffness, Jt. Swelling, pain, limp or strain/sprain. Denies Falls. Skin: Denies puritis, rash, hives, warts, acne, eczema or change in skin lesion Neuro: No weakness, tremor, incoordination, spasms, paresthesia or pain Psychiatric: Denies confusion, memory loss or sensory loss. Denies Depression. Endocrine: Denies change in weight, skin, hair change, nocturia, and paresthesia, diabetic polys, visual blurring or hyper / hypo glycemic episodes.  Heme/Lymph: No excessive bleeding, bruising or enlarged lymph nodes.  Physical Exam  BP 131/89   Pulse 64   Temp (!) 97.3 F (36.3 C)   Resp 18   Ht 6\' 1"  (1.854 m)   Wt 296 lb 12.8 oz (134.6 kg)   BMI 39.16 kg/m   General Appearance: Well nourished and well groomed and in no apparent distress.  Eyes: PERRLA, EOMs, conjunctiva no swelling or erythema, normal fundi and vessels. Sinuses: No frontal/maxillary tenderness ENT/Mouth: EACs patent / TMs  nl. Nares clear without  erythema, swelling, mucoid exudates. Oral hygiene is good. No erythema, swelling, or exudate. Tongue normal, non-obstructing. Tonsils not swollen or erythematous. Hearing normal.  Neck: Supple, thyroid not palpable. No bruits, nodes or JVD. Respiratory: Respiratory effort normal.  BS equal and clear bilateral without rales, rhonci, wheezing or stridor. Cardio: Heart sounds are normal with regular rate and rhythm and no murmurs, rubs or gallops. Peripheral pulses are normal and equal bilaterally without edema. No aortic or femoral bruits. Chest: symmetric with normal excursions and percussion.  Abdomen: Soft, with Nl bowel sounds. Nontender, no guarding, rebound, hernias, masses, or organomegaly.  Lymphatics: Non tender without lymphadenopathy.  Musculoskeletal: Full ROM all peripheral extremities, joint stability, 5/5 strength, and normal gait. Skin: Warm and dry without rashes, lesions, cyanosis, clubbing or  ecchymosis.  Neuro: Cranial nerves intact, reflexes equal bilaterally. Normal muscle tone, no cerebellar symptoms. Sensation intact.  Pysch: Alert and oriented X 3 with normal affect, insight and judgment appropriate.   Assessment and Plan  1. Annual Preventative/Screening Exam   2. Essential hypertension  - CBC with Diff - COMPLETE METABOLIC PANEL WITH GFR - Magnesium -  TSH - EKG 12-Lead - Urinalysis, Routine w reflex microscopic - Microalbumin / Creatinine Urine Ratio - olmesartan (BENICAR) 40 MG tablet; Take 1/2 to 1 tablet at night for BP & Diabetic Kidney Protection  Dispense: 90 tablet; Refill: 1  3. Hyperlipidemia associated with type 2 diabetes mellitus (HCC)  - Lipid Profile - TSH - EKG 12-Lead  4. Type 2 diabetes mellitus with stage 2 chronic kidney disease, with long-term current use of insulin (HCC)  - Hemoglobin A1c ([facility]) - EKG 12-Lead - Urinalysis, Routine w reflex microscopic - Microalbumin / Creatinine Urine Ratio - HM DIABETES FOOT EXAM - LOW  EXTREMITY NEUR EXAM DOCUM  5. Vitamin D deficiency  - Vitamin D (25 hydroxy)  6. Hypothyroidism  - TSH  7. Screening for colorectal cancer  - POC Hemoccult Bld/Stl   8. Screening-pulmonary TB  - TB Skin Test  9. Prostate cancer screening  - PSA  10. Screening for ischemic heart disease  - EKG 12-Lead  11. FHx: heart disease  - EKG 12-Lead  12. Former smoker  - EKG 12-Lead  13. Fatigue  - Iron,Total/Total Iron Binding Cap - Vitamin B12 - Testosterone, Total  14. Medication management  - CBC with Diff - COMPLETE METABOLIC PANEL WITH GFR - Magnesium - Lipid Profile - TSH - Hemoglobin A1c ([facility]) - Vitamin D (25 hydroxy) - Urinalysis, Routine w reflex microscopic - Microalbumin / Creatinine Urine Ratio          Patient was counseled in prudent diet, weight control to achieve/maintain BMI less than 25, BP monitoring, regular exercise and medications as discussed.  Discussed med effects and SE's. Routine screening labs and tests as requested with regular follow-up as recommended. Over 40 minutes of exam, counseling, chart review and high complex critical decision making was performed   Kirtland Bouchard, MD

## 2019-02-25 NOTE — Patient Instructions (Signed)

## 2019-02-26 ENCOUNTER — Other Ambulatory Visit: Payer: Self-pay | Admitting: Internal Medicine

## 2019-02-26 LAB — COMPLETE METABOLIC PANEL WITH GFR
AG Ratio: 1.3 (calc) (ref 1.0–2.5)
ALT: 30 U/L (ref 9–46)
AST: 18 U/L (ref 10–40)
Albumin: 4.1 g/dL (ref 3.6–5.1)
Alkaline phosphatase (APISO): 83 U/L (ref 36–130)
BUN: 16 mg/dL (ref 7–25)
CO2: 24 mmol/L (ref 20–32)
Calcium: 9.7 mg/dL (ref 8.6–10.3)
Chloride: 105 mmol/L (ref 98–110)
Creat: 0.94 mg/dL (ref 0.60–1.35)
GFR, Est African American: 110 mL/min/{1.73_m2} (ref >=60)
GFR, Est Non African American: 95 mL/min/{1.73_m2} (ref >=60)
Globulin: 3.1 g/dL (calc) (ref 1.9–3.7)
Glucose, Bld: 151 mg/dL — ABNORMAL HIGH (ref 65–99)
Potassium: 4.5 mmol/L (ref 3.5–5.3)
Sodium: 141 mmol/L (ref 135–146)
Total Bilirubin: 0.4 mg/dL (ref 0.2–1.2)
Total Protein: 7.2 g/dL (ref 6.1–8.1)

## 2019-02-26 LAB — URINALYSIS, ROUTINE W REFLEX MICROSCOPIC
Bacteria, UA: NONE SEEN /HPF
Bilirubin Urine: NEGATIVE
Glucose, UA: NEGATIVE
Hgb urine dipstick: NEGATIVE
Hyaline Cast: NONE SEEN /LPF
Ketones, ur: NEGATIVE
Leukocytes,Ua: NEGATIVE
Nitrite: NEGATIVE
RBC / HPF: NONE SEEN /HPF (ref 0–2)
Specific Gravity, Urine: 1.019 (ref 1.001–1.03)
Squamous Epithelial / HPF: NONE SEEN /HPF (ref <=5)
WBC, UA: NONE SEEN /HPF (ref 0–5)
pH: <=5 (ref 5.0–8.0)

## 2019-02-26 LAB — IRON, TOTAL/TOTAL IRON BINDING CAP
%SAT: 16 % (calc) — ABNORMAL LOW (ref 20–48)
Iron: 50 ug/dL (ref 50–180)
TIBC: 310 mcg/dL (calc) (ref 250–425)

## 2019-02-26 LAB — CBC WITH DIFFERENTIAL/PLATELET
Absolute Monocytes: 664 cells/uL (ref 200–950)
Basophils Absolute: 42 cells/uL (ref 0–200)
Basophils Relative: 0.5 %
Eosinophils Absolute: 199 cells/uL (ref 15–500)
Eosinophils Relative: 2.4 %
HCT: 43.2 % (ref 38.5–50.0)
Hemoglobin: 14.4 g/dL (ref 13.2–17.1)
Lymphs Abs: 2374 cells/uL (ref 850–3900)
MCH: 29.7 pg (ref 27.0–33.0)
MCHC: 33.3 g/dL (ref 32.0–36.0)
MCV: 89.1 fL (ref 80.0–100.0)
MPV: 10.7 fL (ref 7.5–12.5)
Monocytes Relative: 8 %
Neutro Abs: 5022 cells/uL (ref 1500–7800)
Neutrophils Relative %: 60.5 %
Platelets: 273 10*3/uL (ref 140–400)
RBC: 4.85 10*6/uL (ref 4.20–5.80)
RDW: 13.4 % (ref 11.0–15.0)
Total Lymphocyte: 28.6 %
WBC: 8.3 10*3/uL (ref 3.8–10.8)

## 2019-02-26 LAB — MAGNESIUM: Magnesium: 1.9 mg/dL (ref 1.5–2.5)

## 2019-02-26 LAB — LIPID PANEL
Cholesterol: 194 mg/dL (ref <200)
HDL: 48 mg/dL (ref >=40)
LDL Cholesterol (Calc): 110 mg/dL (calc) — ABNORMAL HIGH
Non-HDL Cholesterol (Calc): 146 mg/dL (calc) — ABNORMAL HIGH (ref <130)
Total CHOL/HDL Ratio: 4 (calc) (ref <5.0)
Triglycerides: 252 mg/dL — ABNORMAL HIGH (ref <150)

## 2019-02-26 LAB — MICROALBUMIN / CREATININE URINE RATIO
Creatinine, Urine: 100 mg/dL (ref 20–320)
Microalb Creat Ratio: 718 mcg/mg creat — ABNORMAL HIGH (ref <30)
Microalb, Ur: 71.8 mg/dL

## 2019-02-26 LAB — VITAMIN B12: Vitamin B-12: 451 pg/mL (ref 200–1100)

## 2019-02-26 LAB — HEMOGLOBIN A1C
Hgb A1c MFr Bld: 6.8 % of total Hgb — ABNORMAL HIGH (ref <5.7)
Mean Plasma Glucose: 148 (calc)
eAG (mmol/L): 8.2 (calc)

## 2019-02-26 LAB — TSH: TSH: 2.13 mIU/L (ref 0.40–4.50)

## 2019-02-26 LAB — PSA: PSA: 0.5 ng/mL (ref <=4.0)

## 2019-02-26 LAB — TESTOSTERONE: Testosterone: 168 ng/dL — ABNORMAL LOW (ref 250–827)

## 2019-02-26 LAB — VITAMIN D 25 HYDROXY (VIT D DEFICIENCY, FRACTURES): Vit D, 25-Hydroxy: 20 ng/mL — ABNORMAL LOW (ref 30–100)

## 2019-02-26 MED ORDER — EZETIMIBE 10 MG PO TABS: ORAL_TABLET | ORAL | 3 refills | Status: DC

## 2019-03-14 HISTORY — PX: ROOT CANAL: SHX2363

## 2019-03-24 ENCOUNTER — Other Ambulatory Visit: Payer: Self-pay | Admitting: Internal Medicine

## 2019-03-24 DIAGNOSIS — E119 Type 2 diabetes mellitus without complications: Secondary | ICD-10-CM

## 2019-04-12 ENCOUNTER — Other Ambulatory Visit: Payer: Self-pay | Admitting: Adult Health Nurse Practitioner

## 2019-04-12 DIAGNOSIS — I10 Essential (primary) hypertension: Secondary | ICD-10-CM

## 2019-05-26 DIAGNOSIS — N181 Chronic kidney disease, stage 1: Secondary | ICD-10-CM | POA: Insufficient documentation

## 2019-05-26 DIAGNOSIS — E1122 Type 2 diabetes mellitus with diabetic chronic kidney disease: Secondary | ICD-10-CM | POA: Insufficient documentation

## 2019-05-26 NOTE — Progress Notes (Signed)
FOLLOW UP  Assessment and Plan:   Hypertension Continue current medications Monitor blood pressure at home; patient to call if consistently greater than 140/80 Continue DASH diet.   Reminder to go to the ER if any CP, SOB, nausea, dizziness, severe HA, changes vision/speech, left arm numbness and tingling and jaw pain.   Hyperlipidemia associated with T2DM ([place]) Above goal, currently taking rosuvastatin 40 mg every other day, try increasing to 5 days/week  LDL goal <70 Continue low cholesterol diet and exercise.  Check lipid panel.   Diabetes with diabetic chronic kidney disease [facility]) Education: Reviewed 'ABCs' of diabetes management (respective goals in parentheses):  A1C (<7), blood pressure (<130/80), and cholesterol (LDL <70) Continue diet and exercise.  Perform daily foot/skin check, notify office of any concerning changes.  Check A1C   CKD 1 associated with T2DM ([facility]) Increase fluids, avoid NSAIDS, monitor sugars, will monitor CMP/GFR  Morbid obesity ([facility]) - BMI 39 with T2DM, htn, hyperlipidemia  Long discussion about weight loss, diet, and exercise Recommended diet heavy in fruits and veggies and low in animal meats, cheeses, and dairy products, appropriate calorie intake Discussed appropriate weight for height, work on resuming previous lifestyle changes Discussed possible addition of GLP-1i to assist with weight loss and tapering off of insulin; will consider; wants to work on lifestyle only first; information provided Continue metformin 4 tabs, novolin 70/30 35 units AM, 30 units PM; continue slow taper monitoring glucose closely  Follow up at next visit  Hypothyroidism continue medications the same reminded to take on an empty stomach 30-75mins before food.  check TSH level  Vitamin D Deficiency Patient has not been supplementing until recently  Defer check Vit D level  Continue diet and meds as discussed. Further disposition pending results of labs. Discussed  med's effects and SE's.   Over 30 minutes of exam, counseling, chart review, and critical decision making was performed.   Future Appointments  Date Time Provider [place]  08/28/2019 11:30 AM Unk Pinto, MD [facility] None  03/24/2020 11:00 AM Unk Pinto, MD [facility] None    ----------------------------------------------------------------------------------------------------------------------  HPI 49 y.o. male  presents for 3 month follow up on hypertension, cholesterol, morbid obesity, T2DM with CKD, vitamin D deficiency.   He reports mom passed away from cancer in 2019-03-21, has been difficult but reports working through it.  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 No drinking any sodas anymore. No alcohol. He was prescribed phentermine but reports had some SE, aggravated mood, doesn't want to take.  Wt Readings from Last 3 Encounters:  05/28/19 (!) 306 lb 6.4 oz (139 kg)  02/25/19 296 lb 12.8 oz (134.6 kg)  12/17/18 292 lb 9.6 oz (132.7 kg)   He has been checking his blood pressure at home, today their BP is BP: 120/86  He does not workout. He denies chest pain, shortness of breath, dizziness.   He is on cholesterol medication (reported had myalgias with atorvastatin currently prescribed rosuvastatin 40 mg taking every other day) and denies myalgias. His cholesterol is not at goal. The cholesterol last visit was:   Lab Results  Component Value Date   CHOL 194 02/25/2019   HDL 48 02/25/2019   LDLCALC 110 (H) 02/25/2019   TRIG 252 (H) 02/25/2019   CHOLHDL 4.0 02/25/2019    He has been working on diet  for T2 diabetes with CKD I, he is on metformin, novolin 7/30 taking 35 units in the morning, 30 units PM, on  ARB/statin, ASA - stopped but willing to restart -  and denies increased appetite, nausea, paresthesia of the  feet, polydipsia, polyuria, visual disturbances, vomiting and weight loss. Reports recent fasting ranging 110-130. Last A1C in the office was:  Lab Results  Component Value Date   HGBA1C 6.8 (H) 02/25/2019    He has CKD 1 associated with T2DM monitored closely at this office:  Lab Results  Component Value Date   [facility] 95 02/25/2019   He is on thyroid medication. His medication was not changed last visit, has been off of levothyroxine for several years after values normalized.  Lab Results  Component Value Date   TSH 2.13 02/25/2019   Patient is on Vitamin D supplement; and was very low at the last visit was off of supplement at that time, has started supplement (10000 IU daily) but reports had run out, just recently restarted again Lab Results  Component Value Date   VD25OH 20 (L) 02/25/2019       Current Medications:  Current Outpatient Medications on File Prior to Visit  Medication Sig  . bisoprolol-hydrochlorothiazide (ZIAC) 5-6.25 MG tablet Take 1 tablet Daily for BP  . Blood Glucose Monitoring Suppl DEVI Check glucose 4 times a day and PRN.  Marland Kitchen glucose blood test strip Check Blood Sugar 4 x /day  ->  before meals & bedtime  . Lancets (ACCU-CHEK SOFT TOUCH) lancets Use as instructed  . metFORMIN (GLUCOPHAGE-XR) 500 MG 24 hr tablet TAKE TWO TABLETS BY MOUTH TWICE A DAY WITH MEALS FOR DIABETES  . Multiple Vitamin (MULTIVITAMIN) capsule Take 1 capsule by mouth daily.  Marland Kitchen olmesartan (BENICAR) 40 MG tablet Take 1/2 to 1 tablet at night for BP & Diabetic Kidney Protection  . OVER THE COUNTER MEDICATION Injects Novolin 70/30 65 units a day.  . rosuvastatin (CRESTOR) 40 MG tablet Take 1 tablet Daily for Cholesterol  . TURMERIC PO Take by mouth daily.  . Cholecalciferol (VITAMIN D3) 125 MCG (5000 UT) CHEW Chew by mouth. Take two chews daily  . ezetimibe (ZETIA) 10 MG tablet Take 1 tablet Daily for Cholesterol (Patient not taking: Reported on 05/28/2019)  . phentermine (ADIPEX-P) 37.5  MG tablet Take 1/2  to 1 tablet every Morning for Dieting & Weight Loss (Patient not taking: Reported on 02/25/2019)   No current facility-administered medications on file prior to visit.     Allergies: No Known Allergies   Medical History:  Past Medical History:  Diagnosis Date  . Hyperlipidemia   . Hypertension   . Morbid obesity ([place])   . Plantar fasciitis   . Prediabetes   . Vitamin D deficiency    Family history- Reviewed and unchanged Social history- Reviewed and unchanged   Review of Systems:  Review of Systems  Constitutional: Negative for malaise/fatigue and weight loss.  HENT: Negative for hearing loss and tinnitus.   Eyes: Negative for blurred vision and double vision.  Respiratory: Negative for cough, shortness of breath and wheezing.   Cardiovascular: Negative for chest pain, palpitations, orthopnea, claudication and leg swelling.  Gastrointestinal: Negative for abdominal pain, blood in stool, constipation, diarrhea, heartburn, melena, nausea and vomiting.  Genitourinary: Negative.   Musculoskeletal: Positive for joint pain (Foot pain after work). Negative for myalgias.  Skin: Negative for rash.  Neurological: Negative for dizziness, tingling, sensory change, weakness and headaches.  Endo/Heme/Allergies: Negative for polydipsia.  Psychiatric/Behavioral: Negative.  Negative for depression and substance abuse. The patient is not nervous/anxious and does not have insomnia.   All other systems reviewed and are negative.   Physical Exam: BP 120/86   Pulse 87   Temp (!) 97.3 F (36.3 C)   Wt (!) 306 lb 6.4 oz (139 kg)   SpO2 94%   BMI 40.42 kg/m  Wt Readings from Last 3 Encounters:  05/28/19 (!) 306 lb 6.4 oz (139 kg)  02/25/19 296 lb 12.8 oz (134.6 kg)  12/17/18 292 lb 9.6 oz (132.7 kg)   General Appearance: Well nourished, in no apparent distress. Eyes: PERRLA, EOMs, conjunctiva no swelling or erythema Sinuses: No Frontal/maxillary  tenderness ENT/Mouth: Ext aud canals clear, TMs without erythema, bulging. No erythema, swelling, or exudate on post pharynx.  Tonsils not swollen or erythematous. Hearing normal.  Neck: Supple, thyroid normal.  Respiratory: Respiratory effort normal, BS equal bilaterally without rales, rhonchi, wheezing or stridor.  Cardio: RRR with no MRGs. Brisk peripheral pulses without edema.  Abdomen: Soft, + BS.  Non tender, no guarding, rebound, hernias, masses. Lymphatics: Non tender without lymphadenopathy.  Musculoskeletal: Full ROM, 5/5 strength, Normal gait Skin: Warm, dry without rashes, lesions, ecchymosis.  Neuro: Cranial nerves intact. No cerebellar symptoms.  Psych: Awake and oriented X 3, normal affect, Insight and Judgment appropriate.    Izora Ribas, NP 11:17 AM [facility]

## 2019-05-28 ENCOUNTER — Other Ambulatory Visit: Payer: Self-pay

## 2019-05-28 ENCOUNTER — Ambulatory Visit: Payer: Managed Care, Other (non HMO) | Admitting: Adult Health

## 2019-05-28 ENCOUNTER — Encounter: Payer: Self-pay | Admitting: Adult Health

## 2019-05-28 VITALS — BP 120/86 | HR 87 | Temp 97.3°F | Wt 306.4 lb

## 2019-05-28 DIAGNOSIS — Z79899 Other long term (current) drug therapy: Secondary | ICD-10-CM

## 2019-05-28 DIAGNOSIS — E1122 Type 2 diabetes mellitus with diabetic chronic kidney disease: Secondary | ICD-10-CM

## 2019-05-28 DIAGNOSIS — I10 Essential (primary) hypertension: Secondary | ICD-10-CM

## 2019-05-28 DIAGNOSIS — E785 Hyperlipidemia, unspecified: Secondary | ICD-10-CM

## 2019-05-28 DIAGNOSIS — E1169 Type 2 diabetes mellitus with other specified complication: Secondary | ICD-10-CM | POA: Diagnosis not present

## 2019-05-28 DIAGNOSIS — E559 Vitamin D deficiency, unspecified: Secondary | ICD-10-CM

## 2019-05-28 DIAGNOSIS — N181 Chronic kidney disease, stage 1: Secondary | ICD-10-CM

## 2019-05-28 NOTE — Patient Instructions (Signed)
Goals    . DIET - INCREASE WATER INTAKE     Aim for 80+ fluid ounces daily     . Weight (lb) < 250 lb (113.4 kg)       Restart aspirin 81 mg daily    Goal is to adjust lifestyle, reduce insulin gradually to get off if possible Insulin tends to make weight loss difficult  In order to help you get off of insulin and help with weight loss may benefit from a GLP-1 inhibitor such as trulicity, ozempic (once a week injectable)  It is not uncommon to see ~50 lb weight loss in the first year on this medication, typically lose more if you weigh more  However, reducing caloric intake, particularly from carbs, increasing exercise (caloric expenditure) should lead to lower blood sugar and reduced insulin needs  Following a very strict low carb diet can be particularly helpful -  Could look into low GI (glycemic index) diet which helps you understand foods that are more likely to effect blood sugars    Dulaglutide injection What is this medicine? DULAGLUTIDE (DOO la GLOO tide) is used to improve blood sugar control in adults with type 2 diabetes. This medicine may be used with other oral diabetes medicines. This drug may also reduce the risk of heart attack or stroke if you have type 2 diabetes and risk factors for heart disease. This medicine may be used for other purposes; ask your health care provider or pharmacist if you have questions. COMMON BRAND NAME(S): Trulicity What should I tell my health care provider before I take this medicine? They need to know if you have any of these conditions:  endocrine tumors (MEN 2) or if someone in your family had these tumors  eye disease, vision problems  history of pancreatitis  kidney disease  liver disease  stomach problems  thyroid cancer or if someone in your family had thyroid cancer  an unusual or allergic reaction to dulaglutide, other medicines, foods, dyes, or preservatives  pregnant or trying to get  pregnant  breast-feeding How should I use this medicine? This medicine is for injection under the skin of your upper leg (thigh), stomach area, or upper arm. It is usually given once every week (every 7 days). You will be taught how to prepare and give this medicine. Use exactly as directed. Take your medicine at regular intervals. Do not take it more often than directed. If you use this medicine with insulin, you should inject this medicine and the insulin separately. Do not mix them together. Do not give the injections right next to each other. Change (rotate) injection sites with each injection. It is important that you put your used needles and syringes in a special sharps container. Do not put them in a trash can. If you do not have a sharps container, call your pharmacist or healthcare provider to get one. A special MedGuide will be given to you by the pharmacist with each prescription and refill. Be sure to read this information carefully each time. This drug comes with INSTRUCTIONS FOR USE. Ask your pharmacist for directions on how to use this drug. Read the information carefully. Talk to your pharmacist or health care provider if you have questions. Talk to your pediatrician regarding the use of this medicine in children. Special care may be needed. Overdosage: If you think you have taken too much of this medicine contact a poison control center or emergency room at once. NOTE: This medicine is only for you.  Do not share this medicine with others. What if I miss a dose? If you miss a dose, take it as soon as you can within 3 days after the missed dose. Then take your next dose at your regular weekly time. If it has been longer than 3 days after the missed dose, do not take the missed dose. Take the next dose at your regular time. Do not take double or extra doses. If you have questions about a missed dose, contact your health care provider for advice. What may interact with this  medicine?  other medicines for diabetes Many medications may cause changes in blood sugar, these include:  alcohol containing beverages  antiviral medicines for HIV or AIDS  aspirin and aspirin-like drugs  certain medicines for blood pressure, heart disease, irregular heart beat  chromium  diuretics  male hormones, such as estrogens or progestins, birth control pills  fenofibrate  gemfibrozil  isoniazid  lanreotide  male hormones or anabolic steroids  MAOIs like Carbex, Eldepryl, Marplan, Nardil, and Parnate  medicines for weight loss  medicines for allergies, asthma, cold, or cough  medicines for depression, anxiety, or psychotic disturbances  niacin  nicotine  NSAIDs, medicines for pain and inflammation, like ibuprofen or naproxen  octreotide  pasireotide  pentamidine  phenytoin  probenecid  quinolone antibiotics such as ciprofloxacin, levofloxacin, ofloxacin  some herbal dietary supplements  steroid medicines such as prednisone or cortisone  sulfamethoxazole; trimethoprim  thyroid hormones Some medications can hide the warning symptoms of low blood sugar (hypoglycemia). You may need to monitor your blood sugar more closely if you are taking one of these medications. These include:  beta-blockers, often used for high blood pressure or heart problems (examples include atenolol, metoprolol, propranolol)  clonidine  guanethidine  reserpine This list may not describe all possible interactions. Give your health care provider a list of all the medicines, herbs, non-prescription drugs, or dietary supplements you use. Also tell them if you smoke, drink alcohol, or use illegal drugs. Some items may interact with your medicine. What should I watch for while using this medicine? Visit your doctor or health care professional for regular checks on your progress. Drink plenty of fluids while taking this medicine. Check with your doctor or health care  professional if you get an attack of severe diarrhea, nausea, and vomiting. The loss of too much body fluid can make it dangerous for you to take this medicine. A test called the HbA1C (A1C) will be monitored. This is a simple blood test. It measures your blood sugar control over the last 2 to 3 months. You will receive this test every 3 to 6 months. Learn how to check your blood sugar. Learn the symptoms of low and high blood sugar and how to manage them. Always carry a quick-source of sugar with you in case you have symptoms of low blood sugar. Examples include hard sugar candy or glucose tablets. Make sure others know that you can choke if you eat or drink when you develop serious symptoms of low blood sugar, such as seizures or unconsciousness. They must get medical help at once. Tell your doctor or health care professional if you have high blood sugar. You might need to change the dose of your medicine. If you are sick or exercising more than usual, you might need to change the dose of your medicine. Do not skip meals. Ask your doctor or health care professional if you should avoid alcohol. Many nonprescription cough and cold products contain  sugar or alcohol. These can affect blood sugar. Pens should never be shared. Even if the needle is changed, sharing may result in passing of viruses like hepatitis or HIV. Wear a medical ID bracelet or chain, and carry a card that describes your disease and details of your medicine and dosage times. What side effects may I notice from receiving this medicine? Side effects that you should report to your doctor or health care professional as soon as possible:  allergic reactions like skin rash, itching or hives, swelling of the face, lips, or tongue  breathing problems  changes in vision  diarrhea that continues or is severe  lump or swelling on the neck  severe nausea  signs and symptoms of infection like fever or chills; cough; sore throat; pain or  trouble passing urine  signs and symptoms of low blood sugar such as feeling anxious, confusion, dizziness, increased hunger, unusually weak or tired, sweating, shakiness, cold, irritable, headache, blurred vision, fast heartbeat, loss of consciousness  signs and symptoms of kidney injury like trouble passing urine or change in the amount of urine  trouble swallowing  unusual stomach upset or pain  vomiting Side effects that usually do not require medical attention (report to your doctor or health care professional if they continue or are bothersome):  diarrhea  loss of appetite  nausea  pain, redness, or irritation at site where injected  stomach upset This list may not describe all possible side effects. Call your doctor for medical advice about side effects. You may report side effects to FDA at 1-800-FDA-1088. Where should I keep my medicine? Keep out of the reach of children. Store unopened pens in a refrigerator between 2 and 8 degrees C (36 and 46 degrees F). Do not freeze or use if the medicine has been frozen. Protect from light and excessive heat. Store in the carton until use. Each single-dose pen can be kept at room temperature, not to exceed 30 degrees C (86 degrees F) for a total of 14 days, if needed. Throw away any unused medicine after the expiration date on the label. NOTE: This sheet is a summary. It may not cover all possible information. If you have questions about this medicine, talk to your doctor, pharmacist, or health care provider.  2020 [employer] Standard (2018-11-12 09:34:53)

## 2019-05-29 ENCOUNTER — Encounter: Payer: Self-pay | Admitting: Adult Health

## 2019-05-29 DIAGNOSIS — K760 Fatty (change of) liver, not elsewhere classified: Secondary | ICD-10-CM | POA: Insufficient documentation

## 2019-05-29 LAB — CBC WITH DIFFERENTIAL/PLATELET
Absolute Monocytes: 639 cells/uL (ref 200–950)
Basophils Absolute: 36 cells/uL (ref 0–200)
Basophils Relative: 0.4 %
Eosinophils Absolute: 234 cells/uL (ref 15–500)
Eosinophils Relative: 2.6 %
HCT: 42.3 % (ref 38.5–50.0)
Hemoglobin: 14.2 g/dL (ref 13.2–17.1)
Lymphs Abs: 2412 cells/uL (ref 850–3900)
MCH: 29.4 pg (ref 27.0–33.0)
MCHC: 33.6 g/dL (ref 32.0–36.0)
MCV: 87.6 fL (ref 80.0–100.0)
MPV: 10.9 fL (ref 7.5–12.5)
Monocytes Relative: 7.1 %
Neutro Abs: 5679 cells/uL (ref 1500–7800)
Neutrophils Relative %: 63.1 %
Platelets: 290 10*3/uL (ref 140–400)
RBC: 4.83 10*6/uL (ref 4.20–5.80)
RDW: 14.9 % (ref 11.0–15.0)
Total Lymphocyte: 26.8 %
WBC: 9 10*3/uL (ref 3.8–10.8)

## 2019-05-29 LAB — COMPLETE METABOLIC PANEL WITH GFR
AG Ratio: 1.3 (calc) (ref 1.0–2.5)
ALT: 47 U/L — ABNORMAL HIGH (ref 9–46)
AST: 26 U/L (ref 10–40)
Albumin: 4.1 g/dL (ref 3.6–5.1)
Alkaline phosphatase (APISO): 96 U/L (ref 36–130)
BUN: 18 mg/dL (ref 7–25)
CO2: 25 mmol/L (ref 20–32)
Calcium: 9.7 mg/dL (ref 8.6–10.3)
Chloride: 101 mmol/L (ref 98–110)
Creat: 0.93 mg/dL (ref 0.60–1.35)
GFR, Est African American: 111 mL/min/{1.73_m2} (ref >=60)
GFR, Est Non African American: 96 mL/min/{1.73_m2} (ref >=60)
Globulin: 3.2 g/dL (calc) (ref 1.9–3.7)
Glucose, Bld: 236 mg/dL — ABNORMAL HIGH (ref 65–99)
Potassium: 4.9 mmol/L (ref 3.5–5.3)
Sodium: 138 mmol/L (ref 135–146)
Total Bilirubin: 0.4 mg/dL (ref 0.2–1.2)
Total Protein: 7.3 g/dL (ref 6.1–8.1)

## 2019-05-29 LAB — MAGNESIUM: Magnesium: 1.6 mg/dL (ref 1.5–2.5)

## 2019-05-29 LAB — HEMOGLOBIN A1C
Hgb A1c MFr Bld: 8.4 % of total Hgb — ABNORMAL HIGH (ref <5.7)
Mean Plasma Glucose: 194 (calc)
eAG (mmol/L): 10.8 (calc)

## 2019-05-29 LAB — LIPID PANEL
Cholesterol: 194 mg/dL (ref <200)
HDL: 44 mg/dL (ref >=40)
LDL Cholesterol (Calc): 98 mg/dL (calc)
Non-HDL Cholesterol (Calc): 150 mg/dL (calc) — ABNORMAL HIGH (ref <130)
Total CHOL/HDL Ratio: 4.4 (calc) (ref <5.0)
Triglycerides: 389 mg/dL — ABNORMAL HIGH (ref <150)

## 2019-05-29 LAB — TSH: TSH: 2.15 mIU/L (ref 0.40–4.50)

## 2019-08-27 NOTE — Patient Instructions (Signed)

## 2019-08-27 NOTE — Progress Notes (Signed)
History of Present Illness:       This very nice 49 y.o. DWM  presents for 6 month follow up with HTN, HLD, T2_NIDDM and Vitamin D Deficiency.       Patient is treated for HTN & BP has been controlled at home. Today's BP was 142/98 by nurse and rechecked x 2 by myself at 154/106. Patient has had no complaints of any cardiac type chest pain, palpitations, dyspnea / orthopnea / PND, dizziness, claudication, or dependent edema.      Hyperlipidemia is controlled with diet & meds. Patient denies myalgias or other med SE's. Last Lipids were at goal except elevated Trig's:  Lab Results  Component Value Date   CHOL 194 05/28/2019   HDL 44 05/28/2019   LDLCALC 98 05/28/2019   TRIG 389 (H) 05/28/2019   CHOLHDL 4.4 05/28/2019    Also, the patient has history of T2_NIDDM and has had no symptoms of reactive hypoglycemia, diabetic polys, paresthesias or visual blurring.  Last A1c was not at goal:  Lab Results  Component Value Date   HGBA1C 8.4 (H) 05/28/2019       Further, the patient also has history of Vitamin D Deficiency and supplements vitamin D without any suspected side-effects. Last vitamin D was still very low :  Lab Results  Component Value Date   VD25OH 20 (L) 02/25/2019    Current Outpatient Medications on File Prior to Visit  Medication Sig  . Blood Glucose Monitoring Suppl DEVI Check glucose 4 times a day and PRN.  Marland Kitchen ezetimibe (ZETIA) 10 MG tablet Take 1 tablet Daily for Cholesterol  . glucose blood test strip Check Blood Sugar 4 x /day  ->  before meals & bedtime  . Lancets (ACCU-CHEK SOFT TOUCH) lancets Use as instructed  . Multiple Vitamin (MULTIVITAMIN) capsule Take 1 capsule by mouth daily.  Marland Kitchen olmesartan (BENICAR) 40 MG tablet Take 1/2 to 1 tablet at night for BP & Diabetic Kidney Protection  . OVER THE COUNTER MEDICATION Injects Novolin 70/30 65 units a day. (35 units AM, 30 units PM)  . phentermine (ADIPEX-P) 37.5 MG tablet Take 1/2  to 1 tablet every Morning  for Dieting & Weight Loss  . rosuvastatin (CRESTOR) 40 MG tablet Take 1 tablet Daily for Cholesterol  . TURMERIC PO Take by mouth daily.   No current facility-administered medications on file prior to visit.    No Known Allergies  PMHx:   Past Medical History:  Diagnosis Date  . Hyperlipidemia   . Hypertension   . Morbid obesity ([place])   . Plantar fasciitis   . Prediabetes   . Vitamin D deficiency     Immunization History  Administered Date(s) Administered  . Influenza Inj Mdck Quad With Preservative 02/06/2018  . PPD Test 02/06/2018, 02/25/2019  . Pneumococcal Polysaccharide-23 01/16/2019  . Tdap 03/13/2012    No past surgical history on file.  FHx:    Reviewed / unchanged  SHx:    Reviewed / unchanged   Systems Review:  Constitutional: Denies fever, chills, wt changes, headaches, insomnia, fatigue, night sweats, change in appetite. Eyes: Denies redness, blurred vision, diplopia, discharge, itchy, watery eyes.  ENT: Denies discharge, congestion, post nasal drip, epistaxis, sore throat, earache, hearing loss, dental pain, tinnitus, vertigo, sinus pain, snoring.  CV: Denies chest pain, palpitations, irregular heartbeat, syncope, dyspnea, diaphoresis, orthopnea, PND, claudication or edema. Respiratory: denies cough, dyspnea, DOE, pleurisy, hoarseness, laryngitis, wheezing.  Gastrointestinal: Denies dysphagia, odynophagia, heartburn, reflux, water  brash, abdominal pain or cramps, nausea, vomiting, bloating, diarrhea, constipation, hematemesis, melena, hematochezia  or hemorrhoids. Genitourinary: Denies dysuria, frequency, urgency, nocturia, hesitancy, discharge, hematuria or flank pain. Musculoskeletal: Denies arthralgias, myalgias, stiffness, jt. swelling, pain, limping or strain/sprain.  Skin: Denies pruritus, rash, hives, warts, acne, eczema or change in skin lesion(s). Neuro: No weakness, tremor, incoordination, spasms, paresthesia or pain. Psychiatric: Denies confusion,  memory loss or sensory loss. Endo: Denies change in weight, skin or hair change.  Heme/Lymph: No excessive bleeding, bruising or enlarged lymph nodes.  Physical Exam  BP  142/98   Pulse 80   T 97 F   Resp 18   Ht 6' 0.5"    Wt 299 lb  BMI 40.05   Recheck BP x 2 at Rt arm = 154/106   Appears  Over  nourished, well groomed  and in no distress.  Eyes: PERRLA, EOMs, conjunctiva no swelling or erythema. Sinuses: No frontal/maxillary tenderness ENT/Mouth: EAC's clear, TM's nl w/o erythema, bulging. Nares clear w/o erythema, swelling, exudates. Oropharynx clear without erythema or exudates. Oral hygiene is good. Tongue normal, non obstructing. Hearing intact.  Neck: Supple. Thyroid not palpable. Car 2+/2+ without bruits, nodes or JVD. Chest: Respirations nl with BS clear & equal w/o rales, rhonchi, wheezing or stridor.  Cor: Heart sounds normal w/ regular rate and rhythm without sig. murmurs, gallops, clicks or rubs. Peripheral pulses normal and equal  without edema.  Abdomen: Soft & bowel sounds normal. Non-tender w/o guarding, rebound, hernias, masses or organomegaly.  Lymphatics: Unremarkable.  Musculoskeletal: Full ROM all peripheral extremities, joint stability, 5/5 strength and normal gait.  Skin: Warm, dry without exposed rashes, lesions or ecchymosis apparent.  Neuro: Cranial nerves intact, reflexes equal bilaterally. Sensory-motor testing grossly intact. Tendon reflexes grossly intact.  Pysch: Alert & oriented x 3.  Insight and judgement nl & appropriate. No ideations.  Assessment and Plan:  1. Essential hypertension  - Continue medication, monitor blood pressure at home.  - Continue DASH diet.  Reminder to go to the ER if any CP,  SOB, nausea, dizziness, severe HA, changes vision/speech.  - CBC with Differential/Platelet - Magnesium - COMPLETE METABOLIC PANEL WITH GFR - TSH  2. Hyperlipidemia associated with type 2 diabetes mellitus ([place])  - Continue diet/meds,  exercise,& lifestyle modifications.  - Continue monitor periodic cholesterol/liver & renal functions   - Lipid panel - TSH  3. Type 2 diabetes mellitus with stage 2 chronic kidney disease,  with long-term current use of insulin ([facility])  - Continue diet, exercise  - Lifestyle modifications.  - Monitor appropriate labs.  - Hemoglobin A1c - Insulin, random  4. Vitamin D deficiency  - Continue supplementation.  - VITAMIN D 25 Hydroxy  5. Medication management  - CBC with Differential/Platelet - Magnesium - COMPLETE METABOLIC PANEL WITH GFR - Lipid panel - TSH - Hemoglobin A1c - Insulin, random - VITAMIN D 25 Hydroxy         Discussed  regular exercise, BP monitoring, weight control to achieve/maintain BMI less than 25 and discussed med and SE's. Recommended labs to assess and monitor clinical status with further disposition pending results of labs.  I discussed the assessment and treatment plan with the patient. The patient was provided an opportunity to ask questions and all were answered. The patient agreed with the plan and demonstrated an understanding of the instructions.  I provided over 30 minutes of exam, counseling, chart review and  complex critical decision making.   Kirtland Bouchard, MD

## 2019-08-28 ENCOUNTER — Ambulatory Visit: Payer: Managed Care, Other (non HMO) | Admitting: Internal Medicine

## 2019-08-28 ENCOUNTER — Other Ambulatory Visit: Payer: Self-pay

## 2019-08-28 ENCOUNTER — Other Ambulatory Visit: Payer: Self-pay | Admitting: *Deleted

## 2019-08-28 ENCOUNTER — Encounter: Payer: Self-pay | Admitting: Internal Medicine

## 2019-08-28 VITALS — BP 142/98 | HR 80 | Temp 97.0°F | Resp 18 | Ht 72.5 in | Wt 299.4 lb

## 2019-08-28 DIAGNOSIS — E119 Type 2 diabetes mellitus without complications: Secondary | ICD-10-CM

## 2019-08-28 DIAGNOSIS — E1169 Type 2 diabetes mellitus with other specified complication: Secondary | ICD-10-CM

## 2019-08-28 DIAGNOSIS — E559 Vitamin D deficiency, unspecified: Secondary | ICD-10-CM

## 2019-08-28 DIAGNOSIS — I10 Essential (primary) hypertension: Secondary | ICD-10-CM | POA: Diagnosis not present

## 2019-08-28 DIAGNOSIS — Z79899 Other long term (current) drug therapy: Secondary | ICD-10-CM

## 2019-08-28 DIAGNOSIS — N182 Chronic kidney disease, stage 2 (mild): Secondary | ICD-10-CM

## 2019-08-28 DIAGNOSIS — E1122 Type 2 diabetes mellitus with diabetic chronic kidney disease: Secondary | ICD-10-CM

## 2019-08-28 DIAGNOSIS — E785 Hyperlipidemia, unspecified: Secondary | ICD-10-CM | POA: Diagnosis not present

## 2019-08-28 DIAGNOSIS — Z794 Long term (current) use of insulin: Secondary | ICD-10-CM

## 2019-08-28 MED ORDER — METFORMIN HCL ER 500 MG PO TB24: ORAL_TABLET | ORAL | 2 refills | Status: DC

## 2019-08-28 MED ORDER — BISOPROLOL-HYDROCHLOROTHIAZIDE 5-6.25 MG PO TABS: ORAL_TABLET | ORAL | 1 refills | Status: DC

## 2019-08-28 MED ORDER — BISOPROLOL-HYDROCHLOROTHIAZIDE 10-6.25 MG PO TABS: ORAL_TABLET | ORAL | 1 refills | Status: DC

## 2019-08-29 LAB — COMPLETE METABOLIC PANEL WITH GFR
AG Ratio: 1.3 (calc) (ref 1.0–2.5)
ALT: 50 U/L — ABNORMAL HIGH (ref 9–46)
AST: 33 U/L (ref 10–35)
Albumin: 4.3 g/dL (ref 3.6–5.1)
Alkaline phosphatase (APISO): 108 U/L (ref 35–144)
BUN: 15 mg/dL (ref 7–25)
CO2: 32 mmol/L (ref 20–32)
Calcium: 10.4 mg/dL — ABNORMAL HIGH (ref 8.6–10.3)
Chloride: 98 mmol/L (ref 98–110)
Creat: 1.06 mg/dL (ref 0.70–1.33)
GFR, Est African American: 94 mL/min/{1.73_m2} (ref >=60)
GFR, Est Non African American: 81 mL/min/{1.73_m2} (ref >=60)
Globulin: 3.4 g/dL (calc) (ref 1.9–3.7)
Glucose, Bld: 253 mg/dL — ABNORMAL HIGH (ref 65–99)
Potassium: 5 mmol/L (ref 3.5–5.3)
Sodium: 136 mmol/L (ref 135–146)
Total Bilirubin: 0.5 mg/dL (ref 0.2–1.2)
Total Protein: 7.7 g/dL (ref 6.1–8.1)

## 2019-08-29 LAB — CBC WITH DIFFERENTIAL/PLATELET
Absolute Monocytes: 702 cells/uL (ref 200–950)
Basophils Absolute: 54 cells/uL (ref 0–200)
Basophils Relative: 0.6 %
Eosinophils Absolute: 252 cells/uL (ref 15–500)
Eosinophils Relative: 2.8 %
HCT: 43.5 % (ref 38.5–50.0)
Hemoglobin: 14.7 g/dL (ref 13.2–17.1)
Lymphs Abs: 2457 cells/uL (ref 850–3900)
MCH: 30.8 pg (ref 27.0–33.0)
MCHC: 33.8 g/dL (ref 32.0–36.0)
MCV: 91.2 fL (ref 80.0–100.0)
MPV: 10.7 fL (ref 7.5–12.5)
Monocytes Relative: 7.8 %
Neutro Abs: 5535 cells/uL (ref 1500–7800)
Neutrophils Relative %: 61.5 %
Platelets: 272 10*3/uL (ref 140–400)
RBC: 4.77 10*6/uL (ref 4.20–5.80)
RDW: 13.7 % (ref 11.0–15.0)
Total Lymphocyte: 27.3 %
WBC: 9 10*3/uL (ref 3.8–10.8)

## 2019-08-29 LAB — LIPID PANEL
Cholesterol: 205 mg/dL — ABNORMAL HIGH (ref <200)
HDL: 44 mg/dL (ref >=40)
Non-HDL Cholesterol (Calc): 161 mg/dL (calc) — ABNORMAL HIGH (ref <130)
Total CHOL/HDL Ratio: 4.7 (calc) (ref <5.0)
Triglycerides: 404 mg/dL — ABNORMAL HIGH (ref <150)

## 2019-08-29 LAB — VITAMIN D 25 HYDROXY (VIT D DEFICIENCY, FRACTURES): Vit D, 25-Hydroxy: 25 ng/mL — ABNORMAL LOW (ref 30–100)

## 2019-08-29 LAB — HEMOGLOBIN A1C
Hgb A1c MFr Bld: 8.7 % of total Hgb — ABNORMAL HIGH (ref <5.7)
Mean Plasma Glucose: 203 (calc)
eAG (mmol/L): 11.2 (calc)

## 2019-08-29 LAB — INSULIN, RANDOM: Insulin: 57.2 u[IU]/mL — ABNORMAL HIGH

## 2019-08-29 LAB — MAGNESIUM: Magnesium: 1.8 mg/dL (ref 1.5–2.5)

## 2019-08-29 LAB — TSH: TSH: 3.82 mIU/L (ref 0.40–4.50)

## 2019-09-26 NOTE — Progress Notes (Deleted)
Assessment and Plan:  There are no diagnoses linked to this encounter.    Further disposition pending results of labs. Discussed med's effects and SE's.   Over 30 minutes of exam, counseling, chart review, and critical decision making was performed.   Future Appointments  Date Time Provider [place]  09/29/2019 11:30 AM Unk Pinto, MD [facility] None  12/02/2019 10:30 AM Vicie Mutters, PA-C [facility] None  03/24/2020 11:00 AM Unk Pinto, MD [facility] None    ------------------------------------------------------------------------------------------------------------------   HPI There were no vitals taken for this visit.  49 y.o.male presents for  *** 45month follow up for what? Weight, BP, phentermine - also diabetic  he is prescribed phentermine for weight loss.  While on the medication they have lost {NUMBERS 0-12:18577} lbs since last visit. They deny palpitations, anxiety, trouble sleeping, elevated BP.   BMI is There is no height or weight on file to calculate BMI., he is working on diet and exercise. Wt Readings from Last 3 Encounters:  08/28/19 299 lb 6.4 oz (135.8 kg)  05/28/19 (!) 306 lb 6.4 oz (139 kg)  02/25/19 296 lb 12.8 oz (134.6 kg)   Typical breakfast: Typical lunch:  Typical dinner: Exercise:  Water intake:    His blood pressure {HAS HAS NOT:18834} been controlled at home, today their BP is    He {DOES_DOES NWG:95621} workout. He denies chest pain, shortness of breath, dizziness.   He {Has/has not:18111} been working on diet and exercise for prediabetes, and denies {Symptoms; diabetes w/o none:19199}. Last A1C in the office was:  Lab Results  Component Value Date   HGBA1C 8.7 (H) 08/28/2019      Past Medical History:  Diagnosis Date  . Hyperlipidemia   . Hypertension   . Morbid obesity ([place])   . Plantar fasciitis   . Prediabetes   . Vitamin D deficiency      No Known Allergies  Current Outpatient Medications on File  Prior to Visit  Medication Sig  . bisoprolol-hydrochlorothiazide (ZIAC) 10-6.25 MG tablet Take 1 tablet every Morning for BP  . Blood Glucose Monitoring Suppl DEVI Check glucose 4 times a day and PRN.  Marland Kitchen ezetimibe (ZETIA) 10 MG tablet Take 1 tablet Daily for Cholesterol  . glucose blood test strip Check Blood Sugar 4 x /day  ->  before meals & bedtime  . Lancets (ACCU-CHEK SOFT TOUCH) lancets Use as instructed  . metFORMIN (GLUCOPHAGE-XR) 500 MG 24 hr tablet TAKE TWO TABLETS BY TWICE A DAY WITH MEALS FOR DIABETES  . Multiple Vitamin (MULTIVITAMIN) capsule Take 1 capsule by mouth daily.  Marland Kitchen olmesartan (BENICAR) 40 MG tablet Take 1/2 to 1 tablet at night for BP & Diabetic Kidney Protection  . OVER THE COUNTER MEDICATION Injects Novolin 70/30 65 units a day. (35 units AM, 30 units PM)  . phentermine (ADIPEX-P) 37.5 MG tablet Take 1/2  to 1 tablet every Morning for Dieting & Weight Loss  . rosuvastatin (CRESTOR) 40 MG tablet Take 1 tablet Daily for Cholesterol  . TURMERIC PO Take by mouth daily.   No current facility-administered medications on file prior to visit.    ROS: all negative except above.   Physical Exam:  There were no vitals taken for this visit.  General Appearance: Well nourished, in no apparent distress. Eyes: PERRLA, EOMs, conjunctiva no swelling or erythema Sinuses: No Frontal/maxillary tenderness ENT/Mouth: Ext aud canals clear, TMs without erythema, bulging. No erythema, swelling, or exudate on post pharynx.  Tonsils not swollen or erythematous. Hearing normal.  Neck: Supple, thyroid normal.  Respiratory: Respiratory effort normal, BS equal bilaterally without rales, rhonchi, wheezing or stridor.  Cardio: RRR with no MRGs. Brisk peripheral pulses without edema.  Abdomen: Soft, + BS.  Non tender, no guarding, rebound, hernias, masses. Lymphatics: Non tender without lymphadenopathy.  Musculoskeletal: Full ROM, 5/5 strength, normal gait.  Skin: Warm, dry without rashes,  lesions, ecchymosis.  Neuro: Cranial nerves intact. Normal muscle tone, no cerebellar symptoms. Sensation intact.  Psych: Awake and oriented X 3, normal affect, Insight and Judgment appropriate.     Izora Ribas, NP 2:56 PM [facility]

## 2019-09-28 NOTE — Progress Notes (Addendum)
RESCHEDULED

## 2019-09-29 ENCOUNTER — Encounter: Payer: Self-pay | Admitting: Internal Medicine

## 2019-09-29 ENCOUNTER — Ambulatory Visit: Payer: Managed Care, Other (non HMO) | Admitting: Internal Medicine

## 2019-09-29 NOTE — Progress Notes (Signed)
   History of Present Illness:                                                      Patient is a very nice 49 yo DWM with hx/o HTN, T2_DM/CKD2, HLD, Vit D Deficiency returning for short 1 month f/u after adding Ziac 10 with his Benicar for uncontrolled HTN measured 142/98 and 154/106 at last OV.  He reports BP's are averaging about 120's/70-80's.                                                       Labs at last visit also showed low Mag 1.8 and  uncontrolled Diabetes with elevated glucose 253 mg% and A1c 8.7%  and very low Vit D 25. He was strongly encouraged stricter diet /weight loss. He alleges with better diet his glucoses are running about 150 mg%.  Medications  .  metFORMIN-XR 500 MG, Take 2 tablets  2 x /day w/meals  .  bisoprolol-hydrochlorothiazide (ZIAC) 10-6.25 MG tablet, Take 1 tablet every Morning for BP .  Ezetimibe 10 MG tablet, Take 1 tablet Daily for Cholesterol .  olmesartan  40 MG tablet, Take 1/2 to 1 tablet at night for BP & Diabetic Kidney Protection .  rosuvastatin (CRESTOR) 40 MG tablet, Take 1 tablet Daily for Cholesterol .  Multiple Vitamin (MULTIVITAMIN) capsule, Take 1 capsule  daily. .   Injects Novolin 70/30 65 units a day. (35 units AM, 30 units PM) .  phentermine  37.5 MG tablet, Take 1/2  to 1 tablet every Morning  .  TURMERIC PO, Take  daily.  Problem list He has Essential hypertension; Hyperlipidemia associated with type 2 diabetes mellitus ([place]); Type 2 diabetes mellitus ([place]); Vitamin D deficiency; Morbid obesity ([place]); CKD stage 1 due to type 2 diabetes mellitus ([place]); and Fatty liver on their problem list.   Observations/Objective:   There were no vitals taken for this visit.  HEENT - WNL. Neck - supple.  Chest - Clear equal BS. Cor - Nl HS. RRR w/o sig MGR. PP 1(+). No edema. MS- FROM w/o deformities.  Gait Nl. Neuro -  Nl w/o focal abnormalities.  Assessment and Plan:  1. Essential hypertension  - continue meds same - discusses regular  daily exercising.  2. Poorly controlled type 2 diabetes mellitus with renal complication ([place])  - Discussed better diet for weight loss.  Follow Up Instructions:       I discussed the assessment and treatment plan with the patient. The patient was provided an opportunity to ask questions and all were answered. The patient agreed with the plan and demonstrated an understanding of the instructions.   Kirtland Bouchard, MD

## 2019-09-30 ENCOUNTER — Other Ambulatory Visit: Payer: Self-pay

## 2019-09-30 ENCOUNTER — Ambulatory Visit: Payer: Managed Care, Other (non HMO) | Admitting: Internal Medicine

## 2019-09-30 ENCOUNTER — Other Ambulatory Visit: Payer: Self-pay | Admitting: *Deleted

## 2019-09-30 ENCOUNTER — Encounter: Payer: Self-pay | Admitting: Internal Medicine

## 2019-09-30 VITALS — BP 126/82 | HR 64 | Temp 97.3°F | Resp 16 | Ht 72.5 in | Wt 302.8 lb

## 2019-09-30 DIAGNOSIS — E1129 Type 2 diabetes mellitus with other diabetic kidney complication: Secondary | ICD-10-CM | POA: Diagnosis not present

## 2019-09-30 DIAGNOSIS — I10 Essential (primary) hypertension: Secondary | ICD-10-CM | POA: Diagnosis not present

## 2019-09-30 DIAGNOSIS — E1165 Type 2 diabetes mellitus with hyperglycemia: Secondary | ICD-10-CM

## 2019-09-30 DIAGNOSIS — E119 Type 2 diabetes mellitus without complications: Secondary | ICD-10-CM

## 2019-09-30 MED ORDER — METFORMIN HCL ER 500 MG PO TB24: ORAL_TABLET | ORAL | 1 refills | Status: DC

## 2019-09-30 MED ORDER — OLMESARTAN MEDOXOMIL 40 MG PO TABS: ORAL_TABLET | ORAL | 1 refills | Status: DC

## 2019-12-02 ENCOUNTER — Ambulatory Visit: Payer: Managed Care, Other (non HMO) | Admitting: Physician Assistant

## 2019-12-18 ENCOUNTER — Encounter: Payer: Self-pay | Admitting: Physician Assistant

## 2019-12-18 ENCOUNTER — Ambulatory Visit: Payer: Managed Care, Other (non HMO) | Admitting: Physician Assistant

## 2019-12-18 ENCOUNTER — Other Ambulatory Visit: Payer: Self-pay

## 2019-12-18 VITALS — BP 120/82 | HR 68 | Temp 97.3°F | Wt 278.0 lb

## 2019-12-18 DIAGNOSIS — K760 Fatty (change of) liver, not elsewhere classified: Secondary | ICD-10-CM

## 2019-12-18 DIAGNOSIS — I10 Essential (primary) hypertension: Secondary | ICD-10-CM

## 2019-12-18 DIAGNOSIS — N181 Chronic kidney disease, stage 1: Secondary | ICD-10-CM

## 2019-12-18 DIAGNOSIS — D649 Anemia, unspecified: Secondary | ICD-10-CM

## 2019-12-18 DIAGNOSIS — E1169 Type 2 diabetes mellitus with other specified complication: Secondary | ICD-10-CM | POA: Diagnosis not present

## 2019-12-18 DIAGNOSIS — E559 Vitamin D deficiency, unspecified: Secondary | ICD-10-CM | POA: Diagnosis not present

## 2019-12-18 DIAGNOSIS — E1122 Type 2 diabetes mellitus with diabetic chronic kidney disease: Secondary | ICD-10-CM

## 2019-12-18 DIAGNOSIS — R351 Nocturia: Secondary | ICD-10-CM

## 2019-12-18 DIAGNOSIS — K921 Melena: Secondary | ICD-10-CM | POA: Diagnosis not present

## 2019-12-18 DIAGNOSIS — Z79899 Other long term (current) drug therapy: Secondary | ICD-10-CM | POA: Diagnosis not present

## 2019-12-18 DIAGNOSIS — E785 Hyperlipidemia, unspecified: Secondary | ICD-10-CM

## 2019-12-18 DIAGNOSIS — R7989 Other specified abnormal findings of blood chemistry: Secondary | ICD-10-CM

## 2019-12-18 DIAGNOSIS — Z1211 Encounter for screening for malignant neoplasm of colon: Secondary | ICD-10-CM

## 2019-12-18 MED ORDER — NOVOLIN 70/30 (70-30) 100 UNIT/ML ~~LOC~~ SUSP: SUBCUTANEOUS | 4 refills | Status: DC

## 2019-12-18 NOTE — Progress Notes (Signed)
Labs show uncontrolled sugars, will increase insulin to 50 units in the morning and 40 units at night, keep CLOSE watch of sugars and monitor at least twice a day, follow up in the office in 2 weeks and will refer urgently to endocrinology for further evaluation.  No ketones in the urine, no evidence of hyperosmolar hyperglycemia but does show a lot of sugar, this is explaining the thirst and the urine. Sodium is low due to high sugars.  Calcium is up, liver function is elevated and alk phos is up. Please avoid tylenol, no alcohol, will get an AB [place], with the weight loss and these numbers I will have a low threshold to get a CT AB.  TSH normal Your trigs are not in range, 12/18/2019: Triglycerides 814. Triglycerides are simple sugars in blood that are converted into a storage form. I recommend you avoid fried/greasy foods, sweets/candy, white rice , white potatoes,  anything made from white flour, sweet tea, soda, fruit juices and avoid alcohol in excess. Sweet potatoes, brown/wild rice/Quinoa, Vegetarian, spinach, or wheat pasta, Multi-grain bread - like multi-grain flat bread or sandwich thins are okay. This is elevated enough to cause acute pancreatitis which can put you in the hospital and kill you. VERY VERY important to be strict with diet.   Will urgently put in order to GI and get AB [place] Discussed reasons to go to ER for the patient   FOLLOW UP  Assessment and Plan:   Nocturia with polydipsia, with weight loss, with uncontrolled sugars.  -     Urinalysis, Routine w reflex microscopic -     Urine Culture - rule out ketosis with urine - may need AB imaging  Hematochezia -     Ambulatory referral to Gastroenterology - with weight loss, fatigue, night sweats will refer to GI for colonoscopy - rule out cancer  Screen for colon cancer -     Ambulatory referral to Gastroenterology  Anemia, unspecified type- with fatigue and blood loss will get -     Iron,Total/Total Iron Binding Cap -      Vitamin B12  Essential hypertension -     CBC with Differential/Platelet -     COMPLETE METABOLIC PANEL WITH GFR -     TSH - continue medications, DASH diet, exercise and monitor at home. Call if greater than 130/80.   Hyperlipidemia associated with type 2 diabetes mellitus (HCC) -     Lipid panel -     insulin NPH-regular Human (NOVOLIN 70/30) (70-30) 100 UNIT/ML injection; Inject 40 units in the AM [place] with food, and inject 35 units [place] with evening meal. Discussed general issues about diabetes pathophysiology and management., Educational material distributed., Suggested low cholesterol diet., Encouraged aerobic exercise., Discussed foot care., Reminded to get yearly retinal exam.  Type 2 diabetes mellitus with stage 1 chronic kidney disease, without long-term current use of insulin (HCC) -     Hemoglobin A1c -     insulin NPH-regular Human (NOVOLIN 70/30) (70-30) 100 UNIT/ML injection; Inject 40 units in the AM [place] with food, and inject 35 units [place] with evening meal. Discussed general issues about diabetes pathophysiology and management., Educational material distributed., Suggested low cholesterol diet., Encouraged aerobic exercise., Discussed foot care., Reminded to get yearly retinal exam.  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 Discussed general issues about diabetes pathophysiology and management., Educational material distributed., Suggested low cholesterol diet., Encouraged aerobic exercise., Discussed foot care., Reminded to get yearly retinal exam.  Morbid obesity ([place]) - follow up 3 months for progress monitoring - increase veggies, decrease carbs - long discussion about weight loss, diet, and exercise  Vitamin D deficiency -     VITAMIN D 25 Hydroxy (Vit-D  Deficiency, Fractures)  Fatty liver -     COMPLETE METABOLIC PANEL WITH GFR Weight loss advised, will monitor LFTs  Medication management -     Magnesium   Continue diet and meds as discussed. Further disposition pending results of labs. Discussed med's effects and SE's.   Over 30 minutes of exam, counseling, chart review, and critical decision making was performed.   Future Appointments  Date Time Provider [place]  03/24/2020 11:00 AM Unk Pinto, MD [facility] None    ----------------------------------------------------------------------------------------------------------------------  HPI 49 y.o. male  presents for 3 month follow up on hypertension, cholesterol, morbid obesity, T2DM with CKD, vitamin D deficiency.   He complains of fatigue for 1-2 months, will wake up every 1-2 hours to urinate. No issues with initiating sleep. He states he is constantly thirsty.  Will have occ night sweats, just shirt wet, no drenching sweat. No gasping awake, no dysuria, no hesitancy, no dribbling or weak stream.  No blood in urine.  He has had BRBPR for the last 1-2 months, went away a week ago. He is 49, has not had colonoscopy.  BM daily, no diarrhea/constipation/no changes in shape/size of stool.  No family history of colon cancer/GI malignancy, mom just passed dec 2020 with breast cancer.   BMI is Body mass index is 37.19 kg/m., he has been working on diet, but states that he has not had much appetite in 1 month, he is drinking more water. He is eating more veggies, no night time snacks, he has been doing less meat. Trying to lose weight. He has NOT been on phentermine. Wt Readings from Last 3 Encounters:  12/18/19 278 lb (126.1 kg)  09/30/19 (!) 302 lb 12.8 oz (137.3 kg)  08/28/19 299 lb 6.4 oz (135.8 kg)   He has been checking his blood pressure at home, he is on bisoprolol, today their BP is BP: 120/82  He does not workout. He denies chest pain, shortness of breath,  dizziness.   He has been working on diet  for T2 diabetes  with CKD I on ARB With hyperlipidemia on crestor 40 mg 5 days a week he is on metformin,  novolin 7/30 taking 40 units in the morning, 35 units PM- takes at 9AM and 9 PM No low sugars  and denies increased appetite, nausea, paresthesia of the feet, polydipsia, polyuria, visual disturbances, vomiting and weight loss.  Reports recent fasting ranging have been in 200-300's Last A1C in the office was:  Lab Results  Component Value Date   HGBA1C 8.7 (H) 08/28/2019   Lab Results  Component Value Date   CHOL 205 (H) 08/28/2019   HDL 44 08/28/2019   [place]  08/28/2019     Comment:     . LDL cholesterol not calculated. Triglyceride levels greater than 400 mg/dL invalidate calculated LDL results. . Reference range: <100 . Desirable range <100 mg/dL for primary prevention;   <70 mg/dL for patients with CHD or diabetic patients  with > or = 2 CHD risk factors. Marland Kitchen LDL-C  is now calculated using the Martin-Hopkins  calculation, which is a validated novel method providing  better accuracy than the Friedewald equation in the  estimation of LDL-C.  Cresenciano Genre et al. Annamaria Helling. 6789;381(01): 2061-2068  (http://education.[facility].com/faq/FAQ164)    TRIG 404 (H) 08/28/2019   CHOLHDL 4.7 08/28/2019    He has CKD 1 associated with T2DM monitored closely at this office:  Lab Results  Component Value Date   GFRNONAA 81 08/28/2019   He is on thyroid medication. His medication was not changed last visit, has been off of levothyroxine for several years after values normalized.  Lab Results  Component Value Date   TSH 3.82 08/28/2019   Patient is on Vitamin D supplement; and was very low at the last visit was off of supplement at that time, has started supplement (10000 IU daily) but reports had run out, just recently restarted again Lab Results  Component Value Date   VD25OH 25 (L) 08/28/2019     Current Medications:  Current  Outpatient Medications on File Prior to Visit  Medication Sig  . bisoprolol-hydrochlorothiazide (ZIAC) 10-6.25 MG tablet Take 1 tablet every Morning for BP  . Blood Glucose Monitoring Suppl DEVI Check glucose 4 times a day and PRN.  Marland Kitchen glucose blood test strip Check Blood Sugar 4 x /day  ->  before meals & bedtime  . Lancets (ACCU-CHEK SOFT TOUCH) lancets Use as instructed  . metFORMIN (GLUCOPHAGE-XR) 500 MG 24 hr tablet TAKE TWO TABLETS  TWICE A DAY WITH MEALS FOR DIABETES  . Multiple Vitamin (MULTIVITAMIN) capsule Take 1 capsule by mouth daily.  Marland Kitchen olmesartan (BENICAR) 40 MG tablet Take 1/2 to 1 tablet at night for BP & Diabetic Kidney Protection  . OVER THE COUNTER MEDICATION Injects Novolin 70/30 65 units a day. (35 units AM, 30 units PM)  . rosuvastatin (CRESTOR) 40 MG tablet Take 1 tablet Daily for Cholesterol  . TURMERIC PO Take by mouth daily.   No current facility-administered medications on file prior to visit.     Allergies: No Known Allergies   Medical History:  Past Medical History:  Diagnosis Date  . Hyperlipidemia   . Hypertension   . Morbid obesity ([place])   . Plantar fasciitis   . Prediabetes   . Vitamin D deficiency    Family history- Reviewed and unchanged Social history- Reviewed and unchanged   Review of Systems:  Review of Systems  Constitutional: Positive for diaphoresis (at night), malaise/fatigue and weight loss. Negative for chills and fever.  HENT: Negative for hearing loss and tinnitus.   Eyes: Negative for blurred vision and double vision.  Respiratory: Negative for cough, hemoptysis, sputum production, shortness of breath and wheezing.   Cardiovascular: Negative for chest pain, palpitations, orthopnea, claudication, leg swelling and PND.  Gastrointestinal: Positive for blood in stool. Negative for abdominal pain, constipation, diarrhea, heartburn, melena, nausea and vomiting.  Genitourinary: Positive for frequency. Negative for dysuria, flank pain,  hematuria and urgency.  Musculoskeletal: Negative for joint pain and myalgias.  Skin: Negative for rash.  Neurological: Negative for dizziness, tingling, sensory change, weakness and headaches.  Endo/Heme/Allergies: Positive for polydipsia. Negative for environmental allergies. Does not bruise/bleed easily.  Psychiatric/Behavioral: Negative.  Negative for depression and substance abuse. The patient is not nervous/anxious and does not have insomnia.   All other systems reviewed and are negative.   Physical Exam: BP 120/82   Pulse 68   Temp (!) 97.3 F (36.3 C)   Wt 278 lb (126.1 kg)   SpO2  98%   BMI 37.19 kg/m  Wt Readings from Last 3 Encounters:  12/18/19 278 lb (126.1 kg)  09/30/19 (!) 302 lb 12.8 oz (137.3 kg)  08/28/19 299 lb 6.4 oz (135.8 kg)   General Appearance: Well nourished, in no apparent distress. Eyes: PERRLA, EOMs, conjunctiva no swelling or erythema Sinuses: No Frontal/maxillary tenderness ENT/Mouth: Ext aud canals clear, TMs without erythema, bulging. No erythema, swelling, or exudate on post pharynx.  Tonsils not swollen or erythematous. Hearing normal.  Neck: Supple, thyroid normal.  Respiratory: Respiratory effort normal, BS equal bilaterally without rales, rhonchi, wheezing or stridor.  Cardio: RRR with no MRGs. Brisk peripheral pulses without edema.  Abdomen: Soft, + BS, obese, Non tender, no guarding, rebound, hernias, masses. Patient declined rectal exam Lymphatics: Non tender without lymphadenopathy.  Musculoskeletal: Full ROM, 5/5 strength, Normal gait Skin: Warm, dry without rashes, lesions, ecchymosis.  Neuro: Cranial nerves intact. No cerebellar symptoms.  Psych: Awake and oriented X 3, normal affect, Insight and Judgment appropriate.    Vicie Mutters, PA-C 4:26 PM [facility]

## 2019-12-18 NOTE — Patient Instructions (Addendum)
No fluids 2 hours bed No caffiene after 1-2 pm  Going to check sugars- may add medication Checking urine If still urinating at night can give samples of medication to try   Going to refer for colonoscopy  Colon cancer is 3rd most diagnosed cancer and 2nd leading cause of death in both men and women 50 years of age and older despite being one of the most preventable and treatable cancers if found early.  4 of out 5 people diagnosed with colon cancer have NO prior family history.  When caught EARLY 90% of colon cancer is curable.  General eating tips  What to Avoid . Avoid added sugars o Often added sugar can be found in processed foods such as many condiments, dry cereals, cakes, cookies, chips, crisps, crackers, candies, sweetened drinks, etc.  o Read labels and AVOID/DECREASE use of foods with the following in their ingredient list: Sugar, fructose, high fructose corn syrup, sucrose, glucose, maltose, dextrose, molasses, cane sugar, brown sugar, any type of syrup, agave nectar, etc.   . Avoid snacking in between meals- drink water or if you feel you need a snack, pick a high water content snack such as cucumbers, watermelon, or any veggie.  Marland Kitchen Avoid foods made with flour o If you are going to eat food made with flour, choose those made with whole-grains; and, minimize your consumption as much as is tolerable . Avoid processed foods o These foods are generally stocked in the middle of the grocery store.  o Focus on shopping on the perimeter of the grocery.  What to Include . Vegetables o GREEN LEAFY VEGETABLES: Kale, spinach, mustard greens, collard greens, cabbage, broccoli, etc. o OTHER: Asparagus, cauliflower, eggplant, carrots, peas, Brussel sprouts, tomatoes, bell peppers, zucchini, beets, cucumbers, etc. . Grains, seeds, and legumes o Beans: kidney beans, black eyed peas, garbanzo beans, black beans, pinto beans, etc. o Whole, unrefined grains: brown rice, barley, bulgur, oatmeal,  etc. . Healthy fats  o Avoid highly processed fats such as vegetable oil o Examples of healthy fats: avocado, olives, virgin olive oil, dark chocolate (?72% Cocoa), nuts (peanuts, almonds, walnuts, cashews, pecans, etc.) o Please still do small amount of these healthy fats, they are dense in calories.  . Low - Moderate Intake of Animal Sources of Protein o Meat sources: chicken, [place], salmon, tuna. Limit to 4 ounces of meat at one time or the size of your palm. o Consider limiting dairy sources, but when choosing dairy focus on: PLAIN [place] yogurt, cottage cheese, high-protein milk . Fruit o Choose berries

## 2019-12-19 ENCOUNTER — Encounter: Payer: Self-pay | Admitting: Gastroenterology

## 2019-12-19 LAB — URINALYSIS, ROUTINE W REFLEX MICROSCOPIC
Bacteria, UA: NONE SEEN /HPF
Bilirubin Urine: NEGATIVE
Hgb urine dipstick: NEGATIVE
Hyaline Cast: NONE SEEN /LPF
Ketones, ur: NEGATIVE
Leukocytes,Ua: NEGATIVE
Nitrite: NEGATIVE
RBC / HPF: NONE SEEN /HPF (ref 0–2)
Specific Gravity, Urine: 1.032 (ref 1.001–1.03)
Squamous Epithelial / HPF: NONE SEEN /HPF (ref <=5)
WBC, UA: NONE SEEN /HPF (ref 0–5)
pH: <=5 (ref 5.0–8.0)

## 2019-12-19 LAB — CBC WITH DIFFERENTIAL/PLATELET
Absolute Monocytes: 608 cells/uL (ref 200–950)
Basophils Absolute: 39 cells/uL (ref 0–200)
Basophils Relative: 0.5 %
Eosinophils Absolute: 179 cells/uL (ref 15–500)
Eosinophils Relative: 2.3 %
HCT: 42.4 % (ref 38.5–50.0)
Hemoglobin: 13.5 g/dL (ref 13.2–17.1)
Lymphs Abs: 1919 cells/uL (ref 850–3900)
MCH: 30 pg (ref 27.0–33.0)
MCHC: 31.8 g/dL — ABNORMAL LOW (ref 32.0–36.0)
MCV: 94.2 fL (ref 80.0–100.0)
MPV: 11.7 fL (ref 7.5–12.5)
Monocytes Relative: 7.8 %
Neutro Abs: 5054 cells/uL (ref 1500–7800)
Neutrophils Relative %: 64.8 %
Platelets: 260 10*3/uL (ref 140–400)
RBC: 4.5 10*6/uL (ref 4.20–5.80)
RDW: 13.4 % (ref 11.0–15.0)
Total Lymphocyte: 24.6 %
WBC: 7.8 10*3/uL (ref 3.8–10.8)

## 2019-12-19 LAB — COMPLETE METABOLIC PANEL WITH GFR
AG Ratio: 1.4 (calc) (ref 1.0–2.5)
ALT: 69 U/L — ABNORMAL HIGH (ref 9–46)
AST: 42 U/L — ABNORMAL HIGH (ref 10–35)
Albumin: 4.2 g/dL (ref 3.6–5.1)
Alkaline phosphatase (APISO): 175 U/L — ABNORMAL HIGH (ref 35–144)
BUN: 20 mg/dL (ref 7–25)
CO2: 27 mmol/L (ref 20–32)
Calcium: 10.4 mg/dL — ABNORMAL HIGH (ref 8.6–10.3)
Chloride: 94 mmol/L — ABNORMAL LOW (ref 98–110)
Creat: 1.06 mg/dL (ref 0.70–1.33)
GFR, Est African American: 94 mL/min/{1.73_m2} (ref >=60)
GFR, Est Non African American: 81 mL/min/{1.73_m2} (ref >=60)
Globulin: 3.1 g/dL (calc) (ref 1.9–3.7)
Glucose, Bld: 455 mg/dL — ABNORMAL HIGH (ref 65–99)
Potassium: 4.4 mmol/L (ref 3.5–5.3)
Sodium: 133 mmol/L — ABNORMAL LOW (ref 135–146)
Total Bilirubin: 0.4 mg/dL (ref 0.2–1.2)
Total Protein: 7.3 g/dL (ref 6.1–8.1)

## 2019-12-19 LAB — MAGNESIUM: Magnesium: 1.7 mg/dL (ref 1.5–2.5)

## 2019-12-19 LAB — URINE CULTURE
MICRO NUMBER:: 11044765
Result:: NO GROWTH
SPECIMEN QUALITY:: ADEQUATE

## 2019-12-19 LAB — IRON, TOTAL/TOTAL IRON BINDING CAP
%SAT: 14 % (calc) — ABNORMAL LOW (ref 20–48)
Iron: 54 ug/dL (ref 50–180)
TIBC: 385 mcg/dL (calc) (ref 250–425)

## 2019-12-19 LAB — LIPID PANEL
Cholesterol: 205 mg/dL — ABNORMAL HIGH (ref <200)
HDL: 35 mg/dL — ABNORMAL LOW (ref >=40)
Non-HDL Cholesterol (Calc): 170 mg/dL (calc) — ABNORMAL HIGH (ref <130)
Total CHOL/HDL Ratio: 5.9 (calc) — ABNORMAL HIGH (ref <5.0)
Triglycerides: 814 mg/dL — ABNORMAL HIGH (ref <150)

## 2019-12-19 LAB — VITAMIN B12: Vitamin B-12: 744 pg/mL (ref 200–1100)

## 2019-12-19 LAB — HEMOGLOBIN A1C: Hgb A1c MFr Bld: >14 % of total Hgb — ABNORMAL HIGH (ref <5.7)

## 2019-12-19 LAB — TSH: TSH: 3.17 mIU/L (ref 0.40–4.50)

## 2019-12-19 LAB — VITAMIN D 25 HYDROXY (VIT D DEFICIENCY, FRACTURES): Vit D, 25-Hydroxy: 39 ng/mL (ref 30–100)

## 2020-01-04 ENCOUNTER — Other Ambulatory Visit: Payer: Self-pay | Admitting: Internal Medicine

## 2020-01-04 DIAGNOSIS — E782 Mixed hyperlipidemia: Secondary | ICD-10-CM

## 2020-01-19 ENCOUNTER — Other Ambulatory Visit: Payer: Self-pay

## 2020-01-19 ENCOUNTER — Ambulatory Visit (AMBULATORY_SURGERY_CENTER): Payer: Self-pay

## 2020-01-19 ENCOUNTER — Encounter: Payer: Self-pay | Admitting: Gastroenterology

## 2020-01-19 VITALS — Ht 72.5 in | Wt 286.0 lb

## 2020-01-19 DIAGNOSIS — K921 Melena: Secondary | ICD-10-CM

## 2020-01-19 DIAGNOSIS — Z1211 Encounter for screening for malignant neoplasm of colon: Secondary | ICD-10-CM

## 2020-01-19 MED ORDER — SUTAB 1479-225-188 MG PO TABS: 12.0000 | ORAL_TABLET | ORAL | 0 refills | Status: DC

## 2020-01-19 NOTE — Progress Notes (Signed)
No egg or soy allergy known to patient  No issues with past sedation with any surgeries or procedures no intubation problems in the past  No FH of Malignant Hyperthermia No diet pills per patient No home 02 use per patient  No blood thinners per patient  Pt denies issues with constipation  No A fib or A flutter  EMMI video to pt or via [place] 19 guidelines implemented in PV today with Pt and RN   [place] Coupon given to pt in PV today , Code to Pharmacy   Due to the COVID-19 pandemic we are asking patients to follow these guidelines. Please only bring one care partner. Please be aware that your care partner may wait in the car in the parking lot or if they feel like they will be too hot to wait in the car, they may wait in the lobby on the 4th floor. All care partners are required to wear a mask the entire time (we do not have any that we can provide them), they need to practice social distancing, and we will do a Covid check for all patient's and care partners when you arrive. Also we will check their temperature and your temperature. If the care partner waits in their car they need to stay in the parking lot the entire time and we will call them on their cell phone when the patient is ready for discharge so they can bring the car to the front of the building. Also all patient's will need to wear a mask into building.

## 2020-02-02 ENCOUNTER — Other Ambulatory Visit: Payer: Self-pay

## 2020-02-02 ENCOUNTER — Encounter: Payer: Self-pay | Admitting: Gastroenterology

## 2020-02-02 ENCOUNTER — Ambulatory Visit (AMBULATORY_SURGERY_CENTER): Payer: Managed Care, Other (non HMO) | Admitting: Gastroenterology

## 2020-02-02 VITALS — BP 136/89 | HR 74 | Temp 98.0°F | Resp 19 | Ht 72.0 in | Wt 286.0 lb

## 2020-02-02 DIAGNOSIS — Z1211 Encounter for screening for malignant neoplasm of colon: Secondary | ICD-10-CM

## 2020-02-02 DIAGNOSIS — D123 Benign neoplasm of transverse colon: Secondary | ICD-10-CM | POA: Diagnosis not present

## 2020-02-02 MED ORDER — SODIUM CHLORIDE 0.9 % IV SOLN: 500.0000 mL | Freq: Once | INTRAVENOUS | Status: DC

## 2020-02-02 NOTE — Op Note (Signed)
[place] Patient Name: Charles Harrell Procedure Date: 02/02/2020 10:44 AM MRN: [id number] Endoscopist: Mauri Pole , MD Age: 49 Referring MD:  Date of Birth: 1970-10-21 Gender: Male Account #: [id number] Procedure:                Colonoscopy Indications:              Screening for colorectal malignant neoplasm, This                            is the patient's first colonoscopy Medicines:                Monitored Anesthesia Care Procedure:                Pre-Anesthesia Assessment:                           - Prior to the procedure, a History and Physical                            was performed, and patient medications and                            allergies were reviewed. The patient's tolerance of                            previous anesthesia was also reviewed. The risks                            and benefits of the procedure and the sedation                            options and risks were discussed with the patient.                            All questions were answered, and informed consent                            was obtained. Prior Anticoagulants: The patient has                            taken no previous anticoagulant or antiplatelet                            agents. ASA Grade Assessment: III - A patient with                            severe systemic disease. After reviewing the risks                            and benefits, the patient was deemed in                            satisfactory condition to undergo the procedure.  After obtaining informed consent, the colonoscope                            was passed under direct vision. Throughout the                            procedure, the patient's blood pressure, pulse, and                            oxygen saturations were monitored continuously. The                            Colonoscope was introduced through the anus and                            advanced to the  the cecum, identified by                            appendiceal orifice and ileocecal valve. The                            colonoscopy was performed without difficulty. The                            patient tolerated the procedure well. The quality                            of the bowel preparation was adequate. The                            ileocecal valve, appendiceal orifice, and rectum                            were photographed. Scope In: 10:55:45 AM Scope Out: 11:09:59 AM Scope Withdrawal Time: 0 hours 10 minutes 30 seconds  Total Procedure Duration: 0 hours 14 minutes 14 seconds  Findings:                 The perianal and digital rectal examinations were                            normal.                           A 14 mm polyp was found in the transverse colon.                            The polyp was pedunculated. The polyp was removed                            with a hot snare. Resection and retrieval were                            complete.  Scattered small and large-mouthed diverticula were                            found in the sigmoid colon, descending colon,                            transverse colon and ascending colon.                           Non-bleeding external and internal hemorrhoids were                            found during retroflexion. The hemorrhoids were                            medium-sized.                           The exam was otherwise without abnormality. Complications:            No immediate complications. Estimated Blood Loss:     Estimated blood loss was minimal. Impression:               - One 14 mm polyp in the transverse colon, removed                            with a hot snare. Resected and retrieved.                           - Diverticulosis in the sigmoid colon, in the                            descending colon, in the transverse colon and in                            the ascending colon.                            - Non-bleeding external and internal hemorrhoids.                           - The examination was otherwise normal. Recommendation:           - Patient has a contact number available for                            emergencies. The signs and symptoms of potential                            delayed complications were discussed with the                            patient. Return to normal activities tomorrow.                            Written discharge instructions were provided to the  patient.                           - Resume previous diet.                           - Continue present medications.                           - Await pathology results.                           - Repeat colonoscopy in 3 years for surveillance                            based on pathology results. Mauri Pole, MD 02/02/2020 11:17:19 AM This report has been signed electronically.

## 2020-02-02 NOTE — Progress Notes (Signed)
Dr. Silverio Decamp notified of patients 341 blood sugar. Patient is asymptomatic. No orders given.

## 2020-02-02 NOTE — Patient Instructions (Signed)
Handouts given:  Diverticulosis, polyps, Hemorrhoids,  Resume previous diet  continue current medications TAKE INSULIN AS SOON AS YOU GET HOME. Repeat colonoscopy in 3 years  YOU HAD AN ENDOSCOPIC PROCEDURE TODAY AT [place]:   Refer to the procedure report that was given to you for any specific questions about what was found during the examination.  If the procedure report does not answer your questions, please call your gastroenterologist to clarify.  If you requested that your care partner not be given the details of your procedure findings, then the procedure report has been included in a sealed envelope for you to review at your convenience later.  YOU SHOULD EXPECT: Some feelings of bloating in the abdomen. Passage of more gas than usual.  Walking can help get rid of the air that was put into your GI tract during the procedure and reduce the bloating. If you had a lower endoscopy (such as a colonoscopy or flexible sigmoidoscopy) you may notice spotting of blood in your stool or on the toilet paper. If you underwent a bowel prep for your procedure, you may not have a normal bowel movement for a few days.  Please Note:  You might notice some irritation and congestion in your nose or some drainage.  This is from the oxygen used during your procedure.  There is no need for concern and it should clear up in a day or so.  SYMPTOMS TO REPORT IMMEDIATELY:   Following lower endoscopy (colonoscopy or flexible sigmoidoscopy):  Excessive amounts of blood in the stool  Significant tenderness or worsening of abdominal pains  Swelling of the abdomen that is new, acute  Fever of 100F or higher  For urgent or emergent issues, a gastroenterologist can be reached at any hour by calling [phone]. Do not use MyChart messaging for urgent concerns.   DIET:  We do recommend a small meal at first, but then you may proceed to your regular diet.  Drink plenty of fluids but you should  avoid alcoholic beverages for 24 hours.  ACTIVITY:  You should plan to take it easy for the rest of today and you should NOT DRIVE or use heavy machinery until tomorrow (because of the sedation medicines used during the test).    FOLLOW UP: Our staff will call the number listed on your records 48-72 hours following your procedure to check on you and address any questions or concerns that you may have regarding the information given to you following your procedure. If we do not reach you, we will leave a message.  We will attempt to reach you two times.  During this call, we will ask if you have developed any symptoms of COVID 19. If you develop any symptoms (ie: fever, flu-like symptoms, shortness of breath, cough etc.) before then, please call [phone].  If you test positive for Covid 19 in the 2 weeks post procedure, please call and report this information to [place].    If any biopsies were taken you will be contacted by phone or by letter within the next 1-3 weeks.  Please call us at [phone] if you have not heard about the biopsies in 3 weeks.   SIGNATURES/CONFIDENTIALITY: You and/or your care partner have signed paperwork which will be entered into your electronic medical record.  These signatures attest to the fact that that the information above on your After Visit Summary has been reviewed and is understood.  Full responsibility of the confidentiality of this discharge  information lies with you and/or your care-partner.

## 2020-02-02 NOTE — Progress Notes (Signed)
Pt's states no medical or surgical changes since previsit or office visit. VS by CW. 

## 2020-02-02 NOTE — Progress Notes (Signed)
pt tolerated well. VSS. awake and to recovery. Report given to RN.  

## 2020-02-03 ENCOUNTER — Telehealth: Payer: Self-pay

## 2020-02-03 ENCOUNTER — Telehealth: Payer: Self-pay | Admitting: *Deleted

## 2020-02-03 NOTE — Telephone Encounter (Signed)
°  Follow up Call-  Call back number 02/02/2020  Post procedure Call Back phone  # [phone]  Permission to leave phone message Yes  Some recent data might be hidden    [facility]

## 2020-02-03 NOTE — Telephone Encounter (Signed)
  Follow up Call-  Call back number 02/02/2020  Post procedure Call Back phone  # [phone]  Permission to leave phone message Yes  Some recent data might be hidden     2nd follow up call made.  NALM

## 2020-02-17 ENCOUNTER — Encounter: Payer: Self-pay | Admitting: Gastroenterology

## 2020-02-23 ENCOUNTER — Encounter: Payer: Self-pay | Admitting: Internal Medicine

## 2020-03-09 ENCOUNTER — Other Ambulatory Visit: Payer: Self-pay | Admitting: *Deleted

## 2020-03-09 DIAGNOSIS — E782 Mixed hyperlipidemia: Secondary | ICD-10-CM

## 2020-03-09 MED ORDER — ROSUVASTATIN CALCIUM 40 MG PO TABS: ORAL_TABLET | ORAL | 0 refills | Status: DC

## 2020-03-15 ENCOUNTER — Ambulatory Visit: Payer: Managed Care, Other (non HMO) | Admitting: Internal Medicine

## 2020-03-17 ENCOUNTER — Ambulatory Visit (INDEPENDENT_AMBULATORY_CARE_PROVIDER_SITE_OTHER): Payer: Managed Care, Other (non HMO) | Admitting: Adult Health

## 2020-03-17 ENCOUNTER — Other Ambulatory Visit: Payer: Self-pay

## 2020-03-17 ENCOUNTER — Encounter: Payer: Self-pay | Admitting: Adult Health

## 2020-03-17 VITALS — BP 140/92 | HR 80 | Temp 96.4°F | Wt 279.0 lb

## 2020-03-17 DIAGNOSIS — J069 Acute upper respiratory infection, unspecified: Secondary | ICD-10-CM

## 2020-03-17 DIAGNOSIS — J40 Bronchitis, not specified as acute or chronic: Secondary | ICD-10-CM

## 2020-03-17 DIAGNOSIS — H1031 Unspecified acute conjunctivitis, right eye: Secondary | ICD-10-CM

## 2020-03-17 DIAGNOSIS — Z1152 Encounter for screening for COVID-19: Secondary | ICD-10-CM | POA: Diagnosis not present

## 2020-03-17 LAB — POC COVID19 BINAXNOW: SARS Coronavirus 2 Ag: NEGATIVE

## 2020-03-17 MED ORDER — PROMETHAZINE-DM 6.25-15 MG/5ML PO SYRP: 5.0000 mL | ORAL_SOLUTION | Freq: Four times a day (QID) | ORAL | 1 refills | Status: DC | PRN

## 2020-03-17 MED ORDER — ALBUTEROL SULFATE HFA 108 (90 BASE) MCG/ACT IN AERS: 2.0000 | INHALATION_SPRAY | RESPIRATORY_TRACT | 0 refills | Status: DC | PRN

## 2020-03-17 MED ORDER — POLYMYXIN B-TRIMETHOPRIM 10000-0.1 UNIT/ML-% OP SOLN: 1.0000 [drp] | OPHTHALMIC | 0 refills | Status: DC

## 2020-03-17 NOTE — Patient Instructions (Addendum)
     Inhaler - albuterol 1-2 puffs 15 min prior to bedtime   Pick up flonase, 1-2 sprays in each nostril twice daily   Get on allergy med -   Coricidin HBP for congestion   Medicines you can use  Nasal congestion  Little Remedies saline spray (aerosol/mist)- can try this, it is in the kids section - pseudoephedrine (Sudafed)- behind the counter, do not use if you have high blood pressure, medicine that have -D in them.  - phenylephrine (Sudafed PE) -Dextormethorphan + chlorpheniramine (Coricidin HBP)- okay if you have high blood pressure -Oxymetazoline (Afrin) nasal spray- LIMIT to 3 days -Saline nasal spray -Neti pot (used distilled or bottled water)  Ear pain/congestion  -pseudoephedrine (sudafed) - Nasonex/flonase nasal spray  Fever  -Acetaminophen (Tyelnol) -Ibuprofen (Advil, motrin, aleve)  Sore Throat  -Acetaminophen (Tyelnol) -Ibuprofen (Advil, motrin, aleve) -Drink a lot of water -Gargle with salt water - Rest your voice (don't talk) -Throat sprays -Cough drops  Body Aches  -Acetaminophen (Tyelnol) -Ibuprofen (Advil, motrin, aleve)  Headache  -Acetaminophen (Tyelnol) -Ibuprofen (Advil, motrin, aleve) - Exedrin, Exedrin Migraine  Allergy symptoms (cough, sneeze, runny nose, itchy eyes) -Claritin or loratadine cheapest but likely the weakest  -Zyrtec or certizine at night because it can make you sleepy -The strongest is allegra or fexafinadine  Cheapest at [employer], [employer], [employer]  Cough  -Dextromethorphan (Delsym)- medicine that has DM in it -Guafenesin (Mucinex/Robitussin) - cough drops - drink lots of water  Chest Congestion  -Guafenesin (Mucinex/Robitussin)  Red Itchy Eyes  - Naphcon-A  Upset Stomach  - Bland diet (nothing spicy, greasy, fried, and high acid foods like tomatoes, oranges, berries) -OKAY- cereal, bread, soup, crackers, rice -Eat smaller more frequent meals -reduce caffeine, no alcohol -Loperamide (Imodium-AD) if  diarrhea -Prevacid for heart burn  General health when sick  -Hydration -wash your hands frequently -keep surfaces clean -change pillow cases and sheets often -Get fresh air but do not exercise strenuously -Vitamin D, double up on it - Vitamin C -Zinc

## 2020-03-17 NOTE — Progress Notes (Signed)
Assessment and Plan:  Charles Harrell was seen today for cough and sore throat.  Diagnoses and all orders for this visit:  Acute conjunctivitis of right eye, unspecified acute conjunctivitis type Hygiene explained, if symptoms increase Eye Doctor ASAP -     trimethoprim-polymyxin b (POLYTRIM) ophthalmic solution; Place 1 drop into the right eye every 4 (four) hours. For 10 days. Dispense quantity sufficient.  Encounter for screening for COVID-19 -     POC COVID-19 - negative  URI with cough and congestion Suspect post-viral symptoms; fairly benign exam other than conjunctivitis  Discussed the importance of avoiding unnecessary antibiotic therapy. Suggested symptomatic OTC remedies. Nasal steroids, allergy pill, coricidin HBP to help dry secretions No oral steroid due to severely poorly controlled T2DM Try albuterol at night, possible reactive lungs Call back if not improving in 2 days -     albuterol (VENTOLIN HFA) 108 (90 Base) MCG/ACT inhaler; Inhale 2 puffs into the lungs every 4 (four) hours as needed for wheezing or shortness of breath. Please give generic or the one that insurance covers -     promethazine-dextromethorphan (PROMETHAZINE-DM) 6.25-15 MG/5ML syrup; Take 5 mLs by mouth 4 (four) times daily as needed for cough.   Further disposition pending results of labs. Discussed med's effects and SE's.   Over 30 minutes of exam, counseling, chart review, and critical decision making was performed.   Future Appointments  Date Time Provider Department Center  03/24/2020 11:00 AM Lucky Cowboy, MD [facility] None    ------------------------------------------------------------------------------------------------------------------   HPI BP (!) 140/92   Pulse 80   Temp (!) 96.4 F (35.8 C)   Wt 279 lb (126.6 kg)   SpO2 97%   BMI 37.84 kg/m   49 y.o.male former smoker, poorly controlled T2DM on insulin (has follow up next week) presents for evaluation due to 2 weeks ago URI sx,  wanted covid 19 testing, rapid test was negative today prior to appointment. He reports niece was ill prior to onset, does believe he had covid in December.   He reports sx began around 03/04/2020, had cough, fatigue, nasal drainage, no fever/chills, just felt like he had a bad, sx improved around 03/14/2020, but with persistent sore throat, mildly productive cough (intermittent, feels like irritation in throat, tickle will cause him to cough then vomit, denies other GI sx), also sensation of spasm and tightness in chest that makes cough worse only at night when he lies back with drainage down his throat. Denies CP, dyspnea, wheezing.    also with new 2 days of R eye redness with itching, discharge, gritty/foreign body sensation, matted closed in the AM. Denies vision changes, denies contacts.   He has taken robitussin for cough, also taking nyquil.   Past Medical History:  Diagnosis Date  . Diabetes mellitus without complication (HCC)   . Hyperlipidemia   . Hypertension   . Morbid obesity (HCC)   . Plantar fasciitis   . Prediabetes   . Vitamin D deficiency      No Known Allergies  Current Outpatient Medications on File Prior to Visit  Medication Sig  . aspirin 81 MG chewable tablet Chew by mouth daily.  . bisoprolol-hydrochlorothiazide (ZIAC) 10-6.25 MG tablet Take 1 tablet every Morning for BP  . Blood Glucose Monitoring Suppl DEVI Check glucose 4 times a day and PRN.  Marland Kitchen Cholecalciferol (VITAMIN D HIGH POTENCY PO) Take by mouth every other day. 25,00 units  . glucose blood test strip Check Blood Sugar 4 x /day  ->  before meals & bedtime  . insulin NPH-regular Human (70-30) 100 UNIT/ML injection Inject 50 Units into the skin daily with breakfast. 40 units in the pm  . Lancets (ACCU-CHEK SOFT TOUCH) lancets Use as instructed  . metFORMIN (GLUCOPHAGE-XR) 500 MG 24 hr tablet TAKE TWO TABLETS  TWICE A DAY WITH MEALS FOR DIABETES  . Multiple Vitamin (MULTIVITAMIN) capsule Take 1 capsule by  mouth daily.  Marland Kitchen olmesartan (BENICAR) 40 MG tablet Take 1/2 to 1 tablet at night for BP & Diabetic Kidney Protection  . rosuvastatin (CRESTOR) 40 MG tablet Take      1 tablet      Daily      for Cholesterol (Patient taking differently: Takes every other day)  . TURMERIC PO Take by mouth daily.   No current facility-administered medications on file prior to visit.    ROS: all negative except above.   Physical Exam:  BP (!) 140/92   Pulse 80   Temp (!) 96.4 F (35.8 C)   Wt 279 lb (126.6 kg)   SpO2 97%   BMI 37.84 kg/m   General Appearance: Well nourished, morbidly obese young male, in no apparent distress. Eyes: PERRLA, EOMs, R conjunctiva erythematous, no discharge Sinuses: No Frontal/maxillary tenderness ENT/Mouth: Ext aud canals clear, TMs without erythema, bulging. No erythema, swelling, or exudate on post pharynx.  Tonsils not swollen or erythematous. Hearing normal.  Neck: Supple Respiratory: Respiratory effort normal, BS equal bilaterally without rales, rhonchi, wheezing or stridor.  Cardio: RRR with no MRGs. Brisk peripheral pulses without edema.  Abdomen: Soft, + BS.  Non tender, no guarding, rebound, hernias, masses. Lymphatics: Non tender without lymphadenopathy.  Musculoskeletal: normal gait.  Skin: Warm, dry without rashes, lesions, ecchymosis.  Neuro:  Normal muscle tone Psych: Awake and oriented X 3, normal affect, Insight and Judgment appropriate.     Izora Ribas, NP 3:29 PM [facility]

## 2020-03-23 ENCOUNTER — Encounter: Payer: Self-pay | Admitting: Internal Medicine

## 2020-03-23 NOTE — Patient Instructions (Signed)

## 2020-03-23 NOTE — Progress Notes (Signed)
Annual  Screening/Preventative Visit  & Comprehensive Evaluation & Examination     This very nice 50 y.o. DWM presents for a Screening /Preventative Visit & comprehensive evaluation and management of multiple medical co-morbidities.  Patient has been followed for HTN, HLD, T2_NIDDM  and Vitamin D Deficiency.     HTN predates since 1995. Patient's BP has been controlled at home.  Today's BP: 126/90. Patient denies any cardiac symptoms as chest pain, palpitations, shortness of breath, dizziness or ankle swelling.      Patient's hyperlipidemia is not controlled with diet and medications. Patient denies myalgias or other medication SE's. Last lipids were not at goal:  Lab Results  Component Value Date   CHOL 205 (H) 12/18/2019   HDL 35 (L) 12/18/2019   LDLCALC not calculated.  12/18/2019   TRIG 814 (H) 12/18/2019   CHOLHDL 5.9 (H) 12/18/2019       Patient has hx/o  Morbid Obesity (BMI  36.89) and consequent T2_NIDDM (A1c 8.4% /2018) and patient denies reactive hypoglycemic symptoms, visual blurring, diabetic polys or paresthesias. In Sept 2020, patient was started on Novolin 70/30 bid. Patient admits very poor dietary compliance. Last A1c was still not at goal:   Lab Results  Component Value Date   HGBA1C >14.0 (H) 12/18/2019         Finally, patient has history of Vitamin D Deficiency ("21" /2018) and last vitamin D was still low:   Lab Results  Component Value Date   VD25OH 39 12/18/2019    Current Outpatient Medications on File Prior to Visit  Medication Sig  . albuterol HFA inhaler 2 puffs  every 4 hrs as needed   . aspirin 81 MG tablet Chew daily.  . bisoprolol-hctz 10-6.25 MG  Take 1 tablet every Morning for BP  . VITAMIN D  Take every other day. 25,00 units  . Novolin 70/30 Inject 50 Units with breakfast. 50 units in the pm  . metFORMIN-XR 500 MG  TAKE 2 TABLETS  2 x /day w/Meals   . Multiple Vitamin  Take 1 capsule  daily.  Marland Kitchen olmesartan  40 MG  Take 1/2 to 1  tablet at night for BP & Diabetic Kidney Protection  . promethazine-DM  syrup Take 5 mLs  4  times daily as needed  . rosuvastatin  40 MG tablet Takes every other day)  . TURMERIC  Take  daily.    No Known Allergies  Past Medical History:  Diagnosis Date  . Diabetes mellitus without complication ([place])   . Hyperlipidemia   . Hypertension   . Morbid obesity ([place])   . Plantar fasciitis   . Prediabetes   . Vitamin D deficiency    Health Maintenance  Topic Date Due  . Hepatitis C Screening  Never done  . COVID-19 Vaccine (1) Never done  . HIV Screening  Never done  . INFLUENZA VACCINE  10/12/2019  . OPHTHALMOLOGY EXAM  12/12/2019  . FOOT EXAM  02/25/2020  . HEMOGLOBIN A1C  06/17/2020  . TETANUS/TDAP  03/13/2022  . COLONOSCOPY (Pts 45-86yrs Insurance coverage will need to be confirmed)  02/02/2023  . PNEUMOCOCCAL POLYSACCHARIDE VACCINE AGE 53-64 HIGH RISK  Completed   Immunization History  Administered Date(s) Administered  . Influenza Inj Mdck Quad With Preservative 02/06/2018  . PPD Test 02/06/2018, 02/25/2019  . Pneumococcal Polysaccharide-23 01/16/2019  . Tdap 03/13/2012   Last Colon - 02/02/2020 -  Dr Silverio Decamp - Recommended  F/u colon in 3 years  - du4e Dec  2024  Past Surgical History:  Procedure Laterality Date  . ROOT CANAL  2021   Family History  Problem Relation Age of Onset  . Hypertension Mother   . Hyperlipidemia Mother   . Lung cancer Mother   . Heart disease Father   . Hyperlipidemia Father   . Hypertension Father   . Early death Father   . Diabetes Father   . COPD Father   . Colon cancer Neg Hx   . Colon polyps Neg Hx   . Esophageal cancer Neg Hx   . Stomach cancer Neg Hx   . Rectal cancer Neg Hx    Social History   Socioeconomic History  . Marital status: Divorced  . Number of children: 2 children  Occupational History  . Culinary  Tobacco Use  . Smoking status: Former Smoker    Types: Cigarettes    Quit date: 05/13/2011    Years since  quitting: 8.8  . Smokeless tobacco: Never Used  Vaping Use  . Vaping Use: Never used  Substance and Sexual Activity  . Alcohol use: Not Currently  . Drug use: Not Currently    Types: Marijuana  . Sexual activity: Yes   ROS Constitutional: Denies fever, chills, weight loss/gain, headaches, insomnia,  night sweats or change in appetite. Does c/o fatigue. Eyes: Denies redness, blurred vision, diplopia, discharge, itchy or watery eyes.  ENT: Denies discharge, congestion, post nasal drip, epistaxis, sore throat, earache, hearing loss, dental pain, Tinnitus, Vertigo, Sinus pain or snoring.  Cardio: Denies chest pain, palpitations, irregular heartbeat, syncope, dyspnea, diaphoresis, orthopnea, PND, claudication or edema Respiratory: denies cough, dyspnea, DOE, pleurisy, hoarseness, laryngitis or wheezing.  Gastrointestinal: Denies dysphagia, heartburn, reflux, water brash, pain, cramps, nausea, vomiting, bloating, diarrhea, constipation, hematemesis, melena, hematochezia, jaundice or hemorrhoids Genitourinary: Denies dysuria, frequency, urgency, nocturia, hesitancy, discharge, hematuria or flank pain Musculoskeletal: Denies arthralgia, myalgia, stiffness, Jt. Swelling, pain, limp or strain/sprain. Denies Falls. Skin: Denies puritis, rash, hives, warts, acne, eczema or change in skin lesion Neuro: No weakness, tremor, incoordination, spasms, paresthesia or pain Psychiatric: Denies confusion, memory loss or sensory loss. Denies Depression. Endocrine: Denies change in weight, skin, hair change, nocturia, and paresthesia, diabetic polys, visual blurring or hyper / hypo glycemic episodes.  Heme/Lymph: No excessive bleeding, bruising or enlarged lymph nodes.  Physical Exam  BP 126/90   Pulse 68   Temp (!) 96.5 F (35.8 C)   Resp 16   Ht 6\' 1"  (1.854 m)   Wt 279 lb 9.6 oz (126.8 kg)   SpO2 96%   BMI 36.89 kg/m   General Appearance: Over nourished and well groomed and in no apparent  distress.  Eyes: PERRLA, EOMs, conjunctiva no swelling or erythema, normal fundi and vessels. Sinuses: No frontal/maxillary tenderness ENT/Mouth: EACs patent / TMs  nl. Nares clear without erythema, swelling, mucoid exudates. Oral hygiene is good. No erythema, swelling, or exudate. Tongue normal, non-obstructing. Tonsils not swollen or erythematous. Hearing normal.  Neck: Supple, thyroid not palpable. No bruits, nodes or JVD. Respiratory: Respiratory effort normal.  BS equal and clear bilateral without rales, rhonci, wheezing or stridor. Cardio: Heart sounds are normal with regular rate and rhythm and no murmurs, rubs or gallops. Peripheral pulses are normal and equal bilaterally without edema. No aortic or femoral bruits. Chest: symmetric with normal excursions and percussion.  Abdomen: Soft, with Nl bowel sounds. Nontender, no guarding, rebound, hernias, masses, or organomegaly.  Lymphatics: Non tender without lymphadenopathy.  Musculoskeletal: Full ROM all peripheral extremities, joint stability, 5/5  strength, and normal gait. Skin: Warm and dry without rashes, lesions, cyanosis, clubbing or  ecchymosis.  Neuro: Cranial nerves intact, reflexes equal bilaterally. Normal muscle tone, no cerebellar symptoms. Sensation intact.  Pysch: Alert and oriented X 3 with normal affect, insight and judgment appropriate.   Assessment and Plan  1. Annual Preventative/Screening Exam   1. Encounter for general adult medical examination with abnormal findings   2. Essential hypertension  - EKG 12-Lead - [place], RETROPERITNL ABD,  LTD - Urinalysis, Routine w reflex microscopic - Microalbumin / creatinine urine ratio - CBC with Differential/Platelet - COMPLETE METABOLIC PANEL WITH GFR - Magnesium - TSH - [place], RETROPERITNL ABD,  LTD  3. Hyperlipidemia associated with type 2 diabetes mellitus ([place])  - EKG 12-Lead - [place], RETROPERITNL ABD,  LTD - Lipid panel - TSH - [place], RETROPERITNL ABD,  LTD  4. Type  2 diabetes mellitus with stage 2 chronic kidney  disease, with long-term current use of insulin (HCC)  - EKG 12-Lead - [place], RETROPERITNL ABD,  LTD - Urinalysis, Routine w reflex microscopic - Microalbumin / creatinine urine ratio - HM DIABETES FOOT EXAM - LOW EXTREMITY NEUR EXAM DOCUM - COMPLETE METABOLIC PANEL WITH GFR - Hemoglobin A1c - Insulin, random - [place], RETROPERITNL ABD,  LTD - Semaglutide,0.25 or 0.5MG /DOS, (OZEMPIC, 0.25 OR 0.5 MG/DOSE,) 2 MG/1.5ML SOPN; Inject 0.5 mg into the skin once a week.  Dispense: 6 mL; Refill: 0  5. Insulin-requiring or dependent type II diabetes mellitus ([place])  - Semaglutide,0.25 or 0.5MG /DOS, (OZEMPIC, 0.25 OR 0.5 MG/DOSE,) 2 MG/1.5ML SOPN; Inject 0.5 mg into the skin once a week.  Dispense: 6 mL; Refill: 0  6. Vitamin D deficiency  - VITAMIN D 25 Hydroxy   7. Morbid obesity (HCC)  - TSH  8. Hypothyroidism  - TSH  9. Screening-pulmonary TB  - TB Skin Test  10. Prostate cancer screening  - PSA  11. Screening for ischemic heart disease  - EKG 12-Lead  12. FHx: heart disease  - EKG 12-Lead - [place], RETROPERITNL ABD,  LTD - [place], RETROPERITNL ABD,  LTD  13. Former smoker  - EKG 12-Lead - [place], RETROPERITNL ABD,  LTD  14. Screening for AAA (aortic abdominal aneurysm)  - [place], RETROPERITNL ABD,  LTD  15. Fatigue  - Iron,Total/Total Iron Binding Cap - Vitamin B12 - Testosterone  16. Medication management  - Urinalysis, Routine w reflex microscopic - Microalbumin / creatinine urine ratio - TB Skin Test - CBC with Differential/Platelet - COMPLETE METABOLIC PANEL WITH GFR - Magnesium - Lipid panel - TSH - Hemoglobin A1c - Insulin, random - VITAMIN D 25 Hydroxy             Patient was counseled in prudent diet, weight control to achieve/maintain BMI less than 25, BP monitoring, regular exercise and medications as discussed.  Discussed med effects and SE's. Routine screening labs and tests as requested with regular  follow-up as recommended. Over 40 minutes of exam, counseling, chart review and high complex critical decision making was performed   Kirtland Bouchard, MD

## 2020-03-24 ENCOUNTER — Other Ambulatory Visit: Payer: Self-pay

## 2020-03-24 ENCOUNTER — Ambulatory Visit (INDEPENDENT_AMBULATORY_CARE_PROVIDER_SITE_OTHER): Payer: Managed Care, Other (non HMO) | Admitting: Internal Medicine

## 2020-03-24 VITALS — BP 126/90 | HR 68 | Temp 96.5°F | Resp 16 | Ht 73.0 in | Wt 279.6 lb

## 2020-03-24 DIAGNOSIS — Z79899 Other long term (current) drug therapy: Secondary | ICD-10-CM

## 2020-03-24 DIAGNOSIS — Z1389 Encounter for screening for other disorder: Secondary | ICD-10-CM

## 2020-03-24 DIAGNOSIS — Z794 Long term (current) use of insulin: Secondary | ICD-10-CM

## 2020-03-24 DIAGNOSIS — Z87891 Personal history of nicotine dependence: Secondary | ICD-10-CM

## 2020-03-24 DIAGNOSIS — Z131 Encounter for screening for diabetes mellitus: Secondary | ICD-10-CM

## 2020-03-24 DIAGNOSIS — Z125 Encounter for screening for malignant neoplasm of prostate: Secondary | ICD-10-CM | POA: Diagnosis not present

## 2020-03-24 DIAGNOSIS — E039 Hypothyroidism, unspecified: Secondary | ICD-10-CM

## 2020-03-24 DIAGNOSIS — Z136 Encounter for screening for cardiovascular disorders: Secondary | ICD-10-CM

## 2020-03-24 DIAGNOSIS — E559 Vitamin D deficiency, unspecified: Secondary | ICD-10-CM

## 2020-03-24 DIAGNOSIS — Z1329 Encounter for screening for other suspected endocrine disorder: Secondary | ICD-10-CM

## 2020-03-24 DIAGNOSIS — N182 Chronic kidney disease, stage 2 (mild): Secondary | ICD-10-CM

## 2020-03-24 DIAGNOSIS — E1169 Type 2 diabetes mellitus with other specified complication: Secondary | ICD-10-CM

## 2020-03-24 DIAGNOSIS — N401 Enlarged prostate with lower urinary tract symptoms: Secondary | ICD-10-CM

## 2020-03-24 DIAGNOSIS — Z8249 Family history of ischemic heart disease and other diseases of the circulatory system: Secondary | ICD-10-CM | POA: Diagnosis not present

## 2020-03-24 DIAGNOSIS — Z0000 Encounter for general adult medical examination without abnormal findings: Secondary | ICD-10-CM | POA: Diagnosis not present

## 2020-03-24 DIAGNOSIS — Z130 Encounter for screening for diseases of the blood and blood-forming organs and certain disorders involving the immune mechanism: Secondary | ICD-10-CM | POA: Diagnosis not present

## 2020-03-24 DIAGNOSIS — E1122 Type 2 diabetes mellitus with diabetic chronic kidney disease: Secondary | ICD-10-CM

## 2020-03-24 DIAGNOSIS — Z13220 Encounter for screening for lipoid disorders: Secondary | ICD-10-CM

## 2020-03-24 DIAGNOSIS — I10 Essential (primary) hypertension: Secondary | ICD-10-CM

## 2020-03-24 DIAGNOSIS — R350 Frequency of micturition: Secondary | ICD-10-CM

## 2020-03-24 DIAGNOSIS — Z0001 Encounter for general adult medical examination with abnormal findings: Secondary | ICD-10-CM

## 2020-03-24 DIAGNOSIS — Z111 Encounter for screening for respiratory tuberculosis: Secondary | ICD-10-CM

## 2020-03-24 DIAGNOSIS — E785 Hyperlipidemia, unspecified: Secondary | ICD-10-CM

## 2020-03-24 DIAGNOSIS — E119 Type 2 diabetes mellitus without complications: Secondary | ICD-10-CM

## 2020-03-24 DIAGNOSIS — R5383 Other fatigue: Secondary | ICD-10-CM

## 2020-03-24 MED ORDER — OZEMPIC (0.25 OR 0.5 MG/DOSE) 2 MG/1.5ML ~~LOC~~ SOPN
0.5000 mg | PEN_INJECTOR | SUBCUTANEOUS | 0 refills | Status: DC
Start: 2020-03-24 — End: 2020-06-28

## 2020-03-25 LAB — URINALYSIS, ROUTINE W REFLEX MICROSCOPIC
Bacteria, UA: NONE SEEN /HPF
Bilirubin Urine: NEGATIVE
Hgb urine dipstick: NEGATIVE
Hyaline Cast: NONE SEEN /LPF
Ketones, ur: NEGATIVE
Leukocytes,Ua: NEGATIVE
Nitrite: NEGATIVE
RBC / HPF: NONE SEEN /HPF (ref 0–2)
Specific Gravity, Urine: 1.028 (ref 1.001–1.03)
Squamous Epithelial / HPF: NONE SEEN /HPF (ref <=5)
pH: <=5 (ref 5.0–8.0)

## 2020-03-25 LAB — CBC WITH DIFFERENTIAL/PLATELET
Absolute Monocytes: 499 cells/uL (ref 200–950)
Basophils Absolute: 43 cells/uL (ref 0–200)
Basophils Relative: 0.5 %
Eosinophils Absolute: 189 cells/uL (ref 15–500)
Eosinophils Relative: 2.2 %
HCT: 40.3 % (ref 38.5–50.0)
Hemoglobin: 13.2 g/dL (ref 13.2–17.1)
Lymphs Abs: 1961 cells/uL (ref 850–3900)
MCH: 29.5 pg (ref 27.0–33.0)
MCHC: 32.8 g/dL (ref 32.0–36.0)
MCV: 90 fL (ref 80.0–100.0)
MPV: 11.5 fL (ref 7.5–12.5)
Monocytes Relative: 5.8 %
Neutro Abs: 5908 cells/uL (ref 1500–7800)
Neutrophils Relative %: 68.7 %
Platelets: 255 10*3/uL (ref 140–400)
RBC: 4.48 10*6/uL (ref 4.20–5.80)
RDW: 13.2 % (ref 11.0–15.0)
Total Lymphocyte: 22.8 %
WBC: 8.6 10*3/uL (ref 3.8–10.8)

## 2020-03-25 LAB — IRON, TOTAL/TOTAL IRON BINDING CAP
%SAT: 20 % (calc) (ref 20–48)
Iron: 64 ug/dL (ref 50–180)
TIBC: 323 mcg/dL (calc) (ref 250–425)

## 2020-03-25 LAB — VITAMIN B12: Vitamin B-12: 663 pg/mL (ref 200–1100)

## 2020-03-25 LAB — MAGNESIUM: Magnesium: 1.6 mg/dL (ref 1.5–2.5)

## 2020-03-25 LAB — LIPID PANEL
Cholesterol: 166 mg/dL (ref <200)
HDL: 33 mg/dL — ABNORMAL LOW (ref >=40)
Non-HDL Cholesterol (Calc): 133 mg/dL (calc) — ABNORMAL HIGH (ref <130)
Total CHOL/HDL Ratio: 5 (calc) — ABNORMAL HIGH (ref <5.0)
Triglycerides: 438 mg/dL — ABNORMAL HIGH (ref <150)

## 2020-03-25 LAB — MICROALBUMIN / CREATININE URINE RATIO
Creatinine, Urine: 143 mg/dL (ref 20–320)
Microalb Creat Ratio: 209 mcg/mg creat — ABNORMAL HIGH (ref <30)
Microalb, Ur: 29.9 mg/dL

## 2020-03-25 LAB — COMPLETE METABOLIC PANEL WITH GFR
AG Ratio: 1.2 (calc) (ref 1.0–2.5)
ALT: 36 U/L (ref 9–46)
AST: 30 U/L (ref 10–35)
Albumin: 3.7 g/dL (ref 3.6–5.1)
Alkaline phosphatase (APISO): 124 U/L (ref 35–144)
BUN: 16 mg/dL (ref 7–25)
CO2: 26 mmol/L (ref 20–32)
Calcium: 9.1 mg/dL (ref 8.6–10.3)
Chloride: 99 mmol/L (ref 98–110)
Creat: 0.95 mg/dL (ref 0.70–1.33)
GFR, Est African American: 108 mL/min/{1.73_m2} (ref >=60)
GFR, Est Non African American: 93 mL/min/{1.73_m2} (ref >=60)
Globulin: 3.1 g/dL (calc) (ref 1.9–3.7)
Glucose, Bld: 370 mg/dL — ABNORMAL HIGH (ref 65–99)
Potassium: 4.7 mmol/L (ref 3.5–5.3)
Sodium: 133 mmol/L — ABNORMAL LOW (ref 135–146)
Total Bilirubin: 0.4 mg/dL (ref 0.2–1.2)
Total Protein: 6.8 g/dL (ref 6.1–8.1)

## 2020-03-25 LAB — PSA: PSA: 0.63 ng/mL (ref <=4.0)

## 2020-03-25 LAB — VITAMIN D 25 HYDROXY (VIT D DEFICIENCY, FRACTURES): Vit D, 25-Hydroxy: 30 ng/mL (ref 30–100)

## 2020-03-25 LAB — HEMOGLOBIN A1C: Hgb A1c MFr Bld: >14 % of total Hgb — ABNORMAL HIGH (ref <5.7)

## 2020-03-25 LAB — INSULIN, RANDOM: Insulin: 56.7 u[IU]/mL — ABNORMAL HIGH

## 2020-03-25 LAB — TSH: TSH: 1.57 mIU/L (ref 0.40–4.50)

## 2020-03-25 LAB — TESTOSTERONE: Testosterone: 95 ng/dL — ABNORMAL LOW (ref 250–827)

## 2020-03-25 NOTE — Progress Notes (Signed)
========================================================== - Test results slightly outside the reference range are not unusual. If there is anything important, I will review this with you,  otherwise it is considered normal test values.  If you have further questions,  please do not hesitate to contact me at the office or via My Chart.  ========================================================== ==========================================================  -  Iron & Vitamin B12 levels - Both Normal & OK  ==========================================================  -  PSA (Prostate enzyme) - Very Low - Great  ==========================================================  -  Testosterone Level is very low - Losing weight will help raise this level.   Also, Take Zinc 50 mg tablet Daily will also                                                       help raise Testosterone levels naturally ==========================================================  -  Fortunately Kidney Functions are Normal (so far)  ==========================================================  -  Total Chol = 166 is  Excellent   - Very low risk for Heart Attack  / Stroke ========================================================  - But . . . . . Triglycerides (   438   ) or fats in blood are too high  (goal is less than 150)    - Recommend avoid fried & greasy foods,  sweets / candy,   - Avoid white rice  (brown or wild rice or Quinoa is OK),   - Avoid white potatoes  (sweet potatoes are OK)   - Avoid anything made from white flour  - bagels, doughnuts, rolls, buns, biscuits, white and   wheat breads, pizza crust and traditional  pasta made of white flour & egg white  - (Vegetarian pasta or spinach or wheat pasta is OK).    - Multi-grain bread is OK - like multi-grain flat bread or  sandwich thins.   - Avoid alcohol in excess.   - Exercise is also  important. ==========================================================  -  Blood glucose was   370 mg%  which is horrible & way too high   - Also your A1c >14 is still over 14 %  - Horrible  !  !  !  (Ideal or Goal is less than 5.7%)  ==========================================================  -  You absolutely must get on a better weight loss diet &                          take some responsibility to help get your Diabetes in control.  ==========================================================  -  Vitamin D = 30  - is Extremely & Dangerously Low   - Please let Nurse Caren Griffins know if and how much                                                                          Vitamin D that you are taking   To help me determine how much Vitamin D that you need to take. ==========================================================  -  Diet is very Important !   Being diabetic has a  300% increased risk for heart attack,  stroke, cancer, and alzheimer-  type vascular dementia.   It is very important that you work harder with diet by  avoiding all foods that are white except chicken,   fish & calliflower.  - Avoid white rice  (brown & wild rice is OK),   - Avoid white potatoes  (sweet potatoes in moderation is OK),   White bread or wheat bread or anything made out of   white flour like bagels, donuts, rolls, buns, biscuits, cakes,  - pastries, cookies, pizza crust, and pasta (made from  white flour & egg whites)   - vegetarian pasta or spinach or wheat pasta is OK.  - Multigrain breads like Arnold's, Pepperidge Farm or   multigrain sandwich thins or high fiber breads like   [place] bread or "Dave's Killer" breads that are  4 to 5 grams fiber per slice !  are best.    Diet, exercise and weight loss can reverse and cure  diabetes in the early stages.    - Diet, exercise and weight loss is very important in the   control and prevention of complications of diabetes which   affects every system in your body, ie.   -Brain - dementia/stroke,  - eyes - glaucoma/blindness,  - heart - heart attack/heart failure,  - kidneys - dialysis,  - stomach - gastric paralysis,  - intestines - malabsorption,  - nerves - severe painful neuritis,  - circulation - gangrene & loss of a leg(s)  - and finally  . . . . . . . . . . . . . . . . . .    - cancer and Alzheimers. ==========================================================

## 2020-04-02 ENCOUNTER — Other Ambulatory Visit: Payer: Self-pay | Admitting: Internal Medicine

## 2020-04-02 DIAGNOSIS — I10 Essential (primary) hypertension: Secondary | ICD-10-CM

## 2020-04-08 ENCOUNTER — Telehealth: Payer: Self-pay | Admitting: *Deleted

## 2020-04-08 NOTE — Telephone Encounter (Signed)
Left message at patient's pharmacy regarding approval of Ozempic.

## 2020-06-25 NOTE — Progress Notes (Signed)
FOLLOW UP  Assessment and Plan:   Hypertension Continue current medications Monitor blood pressure at home; patient to call if consistently greater than 140/80 Continue DASH diet.   Reminder to go to the ER if any CP, SOB, nausea, dizziness, severe HA, changes vision/speech, left arm numbness and tingling and jaw pain.   Hyperlipidemia associated with T2DM ([place]) Above goal, currently taking rosuvastatin 40 mg 5 days a week and tolerating LDL goal <70 Continue low cholesterol diet and exercise.  Check lipid panel.   Diabetes with diabetic chronic kidney disease [facility]) Education: Reviewed 'ABCs' of diabetes management (respective goals in parentheses):  A1C (<7), blood pressure (<130/80), and cholesterol (LDL <70) Continue diet and exercise.  Perform daily foot/skin check, notify office of any concerning changes.  Check A1C   CKD 1 associated with T2DM (HCC) Increase fluids, avoid NSAIDS, monitor sugars, will monitor CMP/GFR  Morbid obesity (HCC) - BMI 39 with T2DM, htn, hyperlipidemia  Long discussion about weight loss, diet, and exercise Recommended diet heavy in fruits and veggies and low in animal meats, cheeses, and dairy products, appropriate calorie intake Discussed appropriate weight for height, work on resuming previous lifestyle changes Doing well per home glucose with addition of ozempic - when script completes increase to 1 mg/ week  Continue metformin 4 tabs, novolin 70/30 plan to reduce to 40 units and 35 units with increased ozmpic; if any low glucose reduce by another 2-4 units, reminded to not take insulin without eating to avoid hypoglycemia Follow up at next visit  Hypothyroidism continue medications the same reminded to take on an empty stomach 30-4mins before food.  check TSH level  Vitamin D Deficiency Patient has not been supplementing until recently  Defer check Vit D level  Continue diet and meds as discussed. Further disposition pending results of  labs. Discussed med's effects and SE's.   Over 30 minutes of exam, counseling, chart review, and critical decision making was performed.   Future Appointments  Date Time Provider [place]  09/27/2020 11:30 AM Unk Pinto, MD [facility] None  04/07/2021  3:00 PM Unk Pinto, MD [facility] None    ----------------------------------------------------------------------------------------------------------------------  HPI 50 y.o. male  presents for 3 month follow up on hypertension, cholesterol, morbid obesity, T2DM with CKD, vitamin D deficiency.   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 Very rare sodas, has cut way back. No alcohol. Now on ozempic 0.5 mg weekly, has noted improved appetite.  Wt Readings from Last 3 Encounters:  06/28/20 290 lb (131.5 kg)  03/24/20 279 lb 9.6 oz (126.8 kg)  03/17/20 279 lb (126.6 kg)   He has been checking his blood pressure at home, today their BP is BP: 118/74  He does not workout. He denies chest pain, shortness of breath, dizziness.   He is on cholesterol medication (reported had myalgias with atorvastatin currently prescribed rosuvastatin 40 mg taking 5 days a week and does well) and denies myalgias. His cholesterol is not at goal. The cholesterol last visit was:   Lab Results  Component Value Date   CHOL 166 03/24/2020   HDL 33 (L) 03/24/2020   LDLCALC  03/24/2020     Comment:     . LDL cholesterol not calculated. Triglyceride levels greater than 400 mg/dL invalidate calculated LDL results. . Reference range: <100 .  Desirable range <100 mg/dL for primary prevention;   <70 mg/dL for patients with CHD or diabetic patients  with > or = 2 CHD risk factors. Marland Kitchen LDL-C is now calculated using the Martin-Hopkins  calculation, which is a validated novel method providing   better accuracy than the Friedewald equation in the  estimation of LDL-C.  [place] Harrell et al. Annamaria Helling. 1497;026(37): 2061-2068  (http://education.[employer].com/faq/FAQ164)    TRIG 438 (H) 03/24/2020   CHOLHDL 5.0 (H) 03/24/2020    He has been working on diet  for T2 diabetes with CKD I, he is on metformin 2000 mg, novolin 7/30 taking 45 units in the morning, 40 units PM, most recently started on ozempic 0.5 mg on  ARB/statin, ASA-  and denies increased appetite, nausea, paresthesia of the feet, polydipsia, polyuria, visual disturbances, vomiting and weight loss. Reports recent fasting is much improved ranging 100-. Last A1C in the office was:  Lab Results  Component Value Date   HGBA1C >14.0 (H) 03/24/2020    He has CKD 1 associated with T2DM monitored closely at this office:  Lab Results  Component Value Date   GFRNONAA 93 03/24/2020   He is on thyroid medication. His medication was not changed last visit, has been off of levothyroxine for several years after values normalized.  Lab Results  Component Value Date   TSH 1.57 03/24/2020   Patient is on Vitamin D supplement; and was very low at the last visit was off of supplement at that time, has started supplement (10000 IU daily) but reports had run out, just recently restarted again Lab Results  Component Value Date   VD25OH 30 03/24/2020       Current Medications:  Current Outpatient Medications on File Prior to Visit  Medication Sig  . albuterol (VENTOLIN HFA) 108 (90 Base) MCG/ACT inhaler Inhale 2 puffs into the lungs every 4 (four) hours as needed for wheezing or shortness of breath. Please give generic or the one that insurance covers  . aspirin 81 MG chewable tablet Chew by mouth daily.  . bisoprolol-hydrochlorothiazide (ZIAC) 10-6.25 MG tablet TAKE ONE TABLET BY MOUTH DAILY FOR BLOOD PRESSURE  . Blood Glucose Monitoring Suppl DEVI Check glucose 4 times a day and PRN.  Marland Kitchen Cholecalciferol (VITAMIN D HIGH POTENCY PO)  Take by mouth every other day. 25,000 units   . glucose blood test strip Check Blood Sugar 4 x /day  ->  before meals & bedtime  . insulin NPH-regular Human (70-30) 100 UNIT/ML injection Inject 50 Units into the skin daily with breakfast. 40 units in the pm  . Lancets (ACCU-CHEK SOFT TOUCH) lancets Use as instructed  . metFORMIN (GLUCOPHAGE-XR) 500 MG 24 hr tablet TAKE TWO TABLETS  TWICE A DAY WITH MEALS FOR DIABETES  . Multiple Vitamin (MULTIVITAMIN) capsule Take 1 capsule by mouth daily.  Marland Kitchen olmesartan (BENICAR) 40 MG tablet Take 1/2 to 1 tablet at night for BP & Diabetic Kidney Protection  . rosuvastatin (CRESTOR) 40 MG tablet Take      1 tablet      Daily      for Cholesterol (Patient taking differently: Takes every other day)  . TURMERIC PO Take by mouth daily. (Patient not taking: Reported on 06/28/2020)   No current facility-administered medications on file prior to visit.     Allergies: No Known Allergies   Medical History:  Past Medical History:  Diagnosis Date  . Diabetes mellitus without complication ([place])   . Hyperlipidemia   . Hypertension   .  Morbid obesity ([place])   . Plantar fasciitis   . Prediabetes   . Vitamin D deficiency    Family history- Reviewed and unchanged Social history- Reviewed and unchanged   Review of Systems:  Review of Systems  Constitutional: Negative for malaise/fatigue and weight loss.  HENT: Negative for hearing loss and tinnitus.   Eyes: Negative for blurred vision and double vision.  Respiratory: Negative for cough, shortness of breath and wheezing.   Cardiovascular: Negative for chest pain, palpitations, orthopnea, claudication and leg swelling.  Gastrointestinal: Negative for abdominal pain, blood in stool, constipation, diarrhea, heartburn, melena, nausea and vomiting.  Genitourinary: Negative.   Musculoskeletal: Positive for joint pain (Foot pain after work). Negative for myalgias.  Skin: Negative for rash.  Neurological: Negative for  dizziness, tingling, sensory change, weakness and headaches.  Endo/Heme/Allergies: Negative for polydipsia.  Psychiatric/Behavioral: Negative.  Negative for depression and substance abuse. The patient is not nervous/anxious and does not have insomnia.   All other systems reviewed and are negative.   Physical Exam: BP 118/74   Pulse 86   Temp (!) 97.3 F (36.3 C)   Wt 290 lb (131.5 kg)   SpO2 94%   BMI 38.26 kg/m  Wt Readings from Last 3 Encounters:  06/28/20 290 lb (131.5 kg)  03/24/20 279 lb 9.6 oz (126.8 kg)  03/17/20 279 lb (126.6 kg)   General Appearance: Well nourished, in no apparent distress. Eyes: PERRLA, EOMs, conjunctiva no swelling or erythema Sinuses: No Frontal/maxillary tenderness ENT/Mouth: Ext aud canals clear, TMs without erythema, bulging. No erythema, swelling, or exudate on post pharynx.  Tonsils not swollen or erythematous. Hearing normal.  Neck: Supple, thyroid normal.  Respiratory: Respiratory effort normal, BS equal bilaterally without rales, rhonchi, wheezing or stridor.  Cardio: RRR with no MRGs. Brisk peripheral pulses without edema.  Abdomen: Soft, + BS.  Non tender, no guarding, rebound, hernias, masses. Lymphatics: Non tender without lymphadenopathy.  Musculoskeletal: Full ROM, 5/5 strength, Normal gait Skin: Warm, dry without rashes, lesions, ecchymosis.  Neuro: Cranial nerves intact. No cerebellar symptoms.  Psych: Awake and oriented X 3, normal affect, Insight and Judgment appropriate.    Charles Ribas, NP 3:58 PM [facility]

## 2020-06-28 ENCOUNTER — Ambulatory Visit: Payer: Managed Care, Other (non HMO) | Admitting: Adult Health

## 2020-06-28 ENCOUNTER — Other Ambulatory Visit: Payer: Self-pay

## 2020-06-28 ENCOUNTER — Encounter: Payer: Self-pay | Admitting: Adult Health

## 2020-06-28 VITALS — BP 118/74 | HR 86 | Temp 97.3°F | Wt 290.0 lb

## 2020-06-28 DIAGNOSIS — E1169 Type 2 diabetes mellitus with other specified complication: Secondary | ICD-10-CM | POA: Diagnosis not present

## 2020-06-28 DIAGNOSIS — I10 Essential (primary) hypertension: Secondary | ICD-10-CM | POA: Diagnosis not present

## 2020-06-28 DIAGNOSIS — N181 Chronic kidney disease, stage 1: Secondary | ICD-10-CM

## 2020-06-28 DIAGNOSIS — E785 Hyperlipidemia, unspecified: Secondary | ICD-10-CM | POA: Diagnosis not present

## 2020-06-28 DIAGNOSIS — E782 Mixed hyperlipidemia: Secondary | ICD-10-CM

## 2020-06-28 DIAGNOSIS — Z79899 Other long term (current) drug therapy: Secondary | ICD-10-CM

## 2020-06-28 DIAGNOSIS — E1122 Type 2 diabetes mellitus with diabetic chronic kidney disease: Secondary | ICD-10-CM

## 2020-06-28 DIAGNOSIS — E559 Vitamin D deficiency, unspecified: Secondary | ICD-10-CM

## 2020-06-28 MED ORDER — OZEMPIC (1 MG/DOSE) 4 MG/3ML ~~LOC~~ SOPN: 1.0000 mg | PEN_INJECTOR | SUBCUTANEOUS | 1 refills | Status: DC

## 2020-06-28 MED ORDER — ROSUVASTATIN CALCIUM 40 MG PO TABS: ORAL_TABLET | ORAL | 3 refills | Status: DC

## 2020-06-28 MED ORDER — BISOPROLOL-HYDROCHLOROTHIAZIDE 10-6.25 MG PO TABS
ORAL_TABLET | ORAL | 1 refills | Status: DC
Start: 2020-06-28 — End: 2021-02-03

## 2020-06-28 NOTE — Patient Instructions (Signed)
Goals    . DIET - INCREASE WATER INTAKE     Aim for 80+ fluid ounces daily     . Weight (lb) < 250 lb (113.4 kg)        Please ALWAYS check glucose and eat something prior to taking insulin shots  Too much insulin can cause rebound high sugars because your brain needs glucose to function  If feeling fuzzy brained or otherwise unwell during the day, please check insulin  Please reduce insulin to 40 units and 35 units once you increase ozempic dose to 1 mg/week   If low sugars during the day or low prior to dinner - reduce breakfast insulin by 2-4 units  If low sugars in the morning prior to breakfast, start reducing night time insulin  Low is anything <80, or <100 and feeling unwell     Hypoglycemia Hypoglycemia is when the sugar (glucose) level in your blood is too low. Low blood sugar can happen to people who have diabetes and people who do not have diabetes. Low blood sugar can happen quickly, and it can be an emergency. What are the causes? This condition happens most often in people who have diabetes and may be caused by:  Diabetes medicine.  Not eating enough, or not eating often enough.  Doing more physical activity.  Drinking alcohol on an empty stomach. If you do not have diabetes, hypoglycemia may be caused by:  A tumor in the pancreas.  Not eating enough, or not eating for long periods at a time (fasting).  A very bad infection or illness.  Problems after having weight loss (bariatric) surgery.  Kidney failure or liver failure.  Certain medicines. What increases the risk? This condition is more likely to develop in people who:  Have diabetes and take medicines to lower their blood sugar.  Abuse alcohol.  Have a very bad illness. What are the signs or symptoms? Symptoms depend on whether your low blood sugar is mild, moderate, or very low. Mild  Hunger.  Feeling worried or nervous (anxious).  Sweating and feeling clammy.  Feeling dizzy or  light-headed.  Being sleepy or having trouble sleeping.  Feeling like you may vomit (nauseous).  A fast heartbeat.  A headache.  Blurry vision.  Being irritable or grouchy.  Tingling or loss of feeling (numbness) around your mouth, lips, or tongue.  Trouble with moving (coordination). Moderate  Confusion and poor judgment.  Behavior changes.  Weakness.  Uneven heartbeats. Very low Very low blood sugar (severe hypoglycemia) is a medical emergency. It can cause:  Fainting.  Jerky movements that you cannot control (seizure).  Loss of consciousness (coma).  Death. How is this treated? Treating low blood sugar Low blood sugar is often treated by eating or drinking something sugary right away. The snack should contain 15 grams of a fast-acting carb (carbohydrate). Options include:  4 oz (120 mL) of fruit juice.  4-6 oz (120-150 mL) of regular soda (not diet soda).  8 oz (240 mL) of low-fat milk.  Several pieces of hard candy. Check food labels to find out how many to eat for 15 grams.  1 Tbsp (15 mL) of sugar or honey. Treating low blood sugar if you have diabetes If you can think clearly and swallow safely, follow the 15:15 rule:  Take 15 grams of a fast-acting carb. Talk with your doctor about how much you should take.  Always keep a source of fast-acting carb with you, such as: ? Sugar tablets (glucose  pills). Take 4 pills. ? Several pieces of hard candy. Check food labels to see how many pieces to eat for 15 grams. ? 4 oz (120 mL) of fruit juice. ? 4-6 oz (120-150 mL) of regular (not diet) soda. ? 1 Tbsp (15 mL) of honey or sugar.  Check your blood sugar 15 minutes after you take the carb.  If your blood sugar is still at or below 70 mg/dL (3.9 mmol/L), take 15 grams of a carb again.  If your blood sugar does not go above 70 mg/dL (3.9 mmol/L) after 3 tries, get help right away.  After your blood sugar goes back to normal, eat a meal or a snack within  1 hour.   Treating very low blood sugar If your blood sugar is at or below 54 mg/dL (3 mmol/L), you have very low blood sugar, or severe hypoglycemia. This is an emergency. Get medical help right away. If you have very low blood sugar and you cannot eat or drink, you will need to be given a hormone called glucagon. A family member or friend should learn how to check your blood sugar and how to give you glucagon. Ask your doctor if you need to have an emergency glucagon kit at home. Very low blood sugar may also need to be treated in a hospital. Follow these instructions at home: General instructions  Take over-the-counter and prescription medicines only as told by your doctor.  Stay aware of your blood sugar as told by your doctor.  If you drink alcohol: ? Limit how much you use to:  0-1 drink a day for nonpregnant women.  0-2 drinks a day for men. ? Be aware of how much alcohol is in your drink. In the [place] one drink equals one 12 oz bottle of beer (355 mL), one 5 oz glass of wine (148 mL), or one 1 oz glass of hard liquor (44 mL).  Keep all follow-up visits as told by your doctor. This is important. If you have diabetes:  Always have a rapid-acting carb (15 grams) option with you to treat low blood sugar.  Follow your diabetes care plan as told by your doctor. Make sure you: ? Know the symptoms of low blood sugar. ? Check your blood sugar as often as told by your doctor. Always check it before and after exercise. ? Always check your blood sugar before you drive. ? Take your medicines as told. ? Follow your meal plan. ? Eat on time. Do not skip meals.  Share your diabetes care plan with: ? Your work or school. ? People you live with.  Carry a card or wear jewelry that says you have diabetes.   Contact a doctor if:  You have trouble keeping your blood sugar in your target range.  You have low blood sugar often. Get help right away if:  You still have symptoms after you  eat or drink something that contains 15 grams of fast-acting carb and you cannot get your blood sugar above 70 mg/dL by following the 15:15 rule.  Your blood sugar is at or below 54 mg/dL (3 mmol/L).  You have a seizure.  You faint. These symptoms may be an emergency. Do not wait to see if the symptoms will go away. Get medical help right away. Call your local emergency services (911 in the [place] Do not drive yourself to the hospital. Summary  Hypoglycemia happens when the level of sugar (glucose) in your blood is too low.  Low  blood sugar can happen to people who have diabetes and people who do not have diabetes. Low blood sugar can happen quickly, and it can be an emergency.  Make sure you know the symptoms of low blood sugar and know how to treat it.  Always keep a source of sugar (fast-acting carb) with you to treat low blood sugar. This information is not intended to replace advice given to you by your health care provider. Make sure you discuss any questions you have with your health care provider. Document Revised: 01/22/2019 Document Reviewed: 01/22/2019 Elsevier Patient Education  2021 [employer].

## 2020-06-29 ENCOUNTER — Other Ambulatory Visit: Payer: Self-pay | Admitting: Adult Health

## 2020-06-29 MED ORDER — INSULIN NPH ISOPHANE & REGULAR (70-30) 100 UNIT/ML ~~LOC~~ SUSP: SUBCUTANEOUS | 1 refills | Status: DC

## 2020-06-30 ENCOUNTER — Other Ambulatory Visit: Payer: Self-pay | Admitting: Adult Health

## 2020-06-30 ENCOUNTER — Encounter: Payer: Self-pay | Admitting: Adult Health

## 2020-06-30 DIAGNOSIS — D509 Iron deficiency anemia, unspecified: Secondary | ICD-10-CM

## 2020-06-30 LAB — CBC WITH DIFFERENTIAL/PLATELET
Absolute Monocytes: 662 cells/uL (ref 200–950)
Basophils Absolute: 58 cells/uL (ref 0–200)
Basophils Relative: 0.6 %
Eosinophils Absolute: 288 cells/uL (ref 15–500)
Eosinophils Relative: 3 %
HCT: 38.4 % — ABNORMAL LOW (ref 38.5–50.0)
Hemoglobin: 12.5 g/dL — ABNORMAL LOW (ref 13.2–17.1)
Lymphs Abs: 2400 cells/uL (ref 850–3900)
MCH: 29.1 pg (ref 27.0–33.0)
MCHC: 32.6 g/dL (ref 32.0–36.0)
MCV: 89.3 fL (ref 80.0–100.0)
MPV: 11.2 fL (ref 7.5–12.5)
Monocytes Relative: 6.9 %
Neutro Abs: 6192 cells/uL (ref 1500–7800)
Neutrophils Relative %: 64.5 %
Platelets: 256 10*3/uL (ref 140–400)
RBC: 4.3 10*6/uL (ref 4.20–5.80)
RDW: 14.2 % (ref 11.0–15.0)
Total Lymphocyte: 25 %
WBC: 9.6 10*3/uL (ref 3.8–10.8)

## 2020-06-30 LAB — COMPLETE METABOLIC PANEL WITH GFR
AG Ratio: 1.5 (calc) (ref 1.0–2.5)
ALT: 33 U/L (ref 9–46)
AST: 24 U/L (ref 10–35)
Albumin: 4.1 g/dL (ref 3.6–5.1)
Alkaline phosphatase (APISO): 81 U/L (ref 35–144)
BUN: 14 mg/dL (ref 7–25)
CO2: 29 mmol/L (ref 20–32)
Calcium: 9.8 mg/dL (ref 8.6–10.3)
Chloride: 101 mmol/L (ref 98–110)
Creat: 1 mg/dL (ref 0.70–1.33)
GFR, Est African American: 101 mL/min/{1.73_m2} (ref >=60)
GFR, Est Non African American: 87 mL/min/{1.73_m2} (ref >=60)
Globulin: 2.8 g/dL (calc) (ref 1.9–3.7)
Glucose, Bld: 216 mg/dL — ABNORMAL HIGH (ref 65–99)
Potassium: 4.6 mmol/L (ref 3.5–5.3)
Sodium: 139 mmol/L (ref 135–146)
Total Bilirubin: 0.3 mg/dL (ref 0.2–1.2)
Total Protein: 6.9 g/dL (ref 6.1–8.1)

## 2020-06-30 LAB — MAGNESIUM: Magnesium: 1.9 mg/dL (ref 1.5–2.5)

## 2020-06-30 LAB — TEST AUTHORIZATION

## 2020-06-30 LAB — HEMOGLOBIN A1C
Hgb A1c MFr Bld: 7.8 % of total Hgb — ABNORMAL HIGH (ref <5.7)
Mean Plasma Glucose: 177 mg/dL
eAG (mmol/L): 9.8 mmol/L

## 2020-06-30 LAB — IRON,TIBC AND FERRITIN PANEL
%SAT: 11 % (calc) — ABNORMAL LOW (ref 20–48)
Ferritin: 118 ng/mL (ref 38–380)
Iron: 37 ug/dL — ABNORMAL LOW (ref 50–180)
TIBC: 328 mcg/dL (calc) (ref 250–425)

## 2020-06-30 LAB — LIPID PANEL
Cholesterol: 146 mg/dL (ref <200)
HDL: 37 mg/dL — ABNORMAL LOW (ref >=40)
Non-HDL Cholesterol (Calc): 109 mg/dL (calc) (ref <130)
Total CHOL/HDL Ratio: 3.9 (calc) (ref <5.0)
Triglycerides: 495 mg/dL — ABNORMAL HIGH (ref <150)

## 2020-06-30 LAB — TSH: TSH: 1.52 mIU/L (ref 0.40–4.50)

## 2020-07-05 ENCOUNTER — Other Ambulatory Visit: Payer: Self-pay

## 2020-07-05 DIAGNOSIS — E119 Type 2 diabetes mellitus without complications: Secondary | ICD-10-CM

## 2020-07-05 MED ORDER — METFORMIN HCL ER 500 MG PO TB24: ORAL_TABLET | ORAL | 1 refills | Status: DC

## 2020-07-06 ENCOUNTER — Other Ambulatory Visit: Payer: Self-pay

## 2020-07-06 MED ORDER — OZEMPIC (1 MG/DOSE) 4 MG/3ML ~~LOC~~ SOPN: 1.0000 mg | PEN_INJECTOR | SUBCUTANEOUS | 1 refills | Status: DC

## 2020-09-27 ENCOUNTER — Encounter: Payer: Self-pay | Admitting: Internal Medicine

## 2020-09-27 ENCOUNTER — Ambulatory Visit: Payer: Managed Care, Other (non HMO) | Admitting: Internal Medicine

## 2020-09-27 NOTE — Progress Notes (Addendum)
    R  E  S  C  H  E  D  U  L  E  D  Morning of App't

## 2020-09-27 NOTE — Patient Instructions (Signed)

## 2020-10-04 NOTE — Progress Notes (Addendum)
NO SHOW

## 2020-10-05 ENCOUNTER — Ambulatory Visit: Payer: Managed Care, Other (non HMO) | Admitting: Internal Medicine

## 2020-10-05 DIAGNOSIS — Z79899 Other long term (current) drug therapy: Secondary | ICD-10-CM

## 2020-10-05 DIAGNOSIS — I10 Essential (primary) hypertension: Secondary | ICD-10-CM

## 2020-10-05 DIAGNOSIS — E1169 Type 2 diabetes mellitus with other specified complication: Secondary | ICD-10-CM

## 2020-10-05 DIAGNOSIS — E559 Vitamin D deficiency, unspecified: Secondary | ICD-10-CM

## 2020-10-05 DIAGNOSIS — E039 Hypothyroidism, unspecified: Secondary | ICD-10-CM

## 2020-10-05 DIAGNOSIS — N182 Chronic kidney disease, stage 2 (mild): Secondary | ICD-10-CM

## 2020-10-10 ENCOUNTER — Encounter: Payer: Self-pay | Admitting: Internal Medicine

## 2020-10-10 NOTE — Patient Instructions (Signed)

## 2020-10-10 NOTE — Progress Notes (Signed)
Future Appointments  Date Time Provider [place]  10/11/2020 10:30 AM Unk Pinto, MD [facility] None  04/07/2021  3:00 PM Unk Pinto, MD [facility] None    History of Present Illness:       This very nice 50 y.o. DWM presents  overdue for 3 month follow up with HTN, HLD, Pre-Diabetes and Vitamin D Deficiency.        Patient is treated for HTN (1995)  & BP has been controlled at home. Today's BP is at goal - 126/74. Patient has had no complaints of any cardiac type chest pain, palpitations, dyspnea / orthopnea / PND, dizziness, claudication, or dependent edema.       Hyperlipidemia is controlled with diet & meds. Patient denies myalgias or other med SE's. Last Lipids were not at goal with elevated Trig's:  Lab Results  Component Value Date   CHOL 146 06/28/2020   HDL 37 (L) 06/28/2020   LDLCALC not calculated 06/28/2020   TRIG 495 (H) 06/28/2020   CHOLHDL 3.9 06/28/2020     Also, the patient has hx/o  Morbid Obesity (BMI  37.21) an dconsequent T2_NIDDM (A1c 8.4% /2018) w/CKD2 (GFR 87) and has had no symptoms of reactive hypoglycemia, diabetic polys, paresthesias or visual blurring.  Patient does report that he has been doing better since on Ozempic with less over eating. Last A1c was not at goal:  Lab Results  Component Value Date   HGBA1C 7.8 (H) 06/28/2020                                                       Further, the patient also has history of Vitamin D Deficiency ("21"/2018) and supplements vitamin D without any suspected side-effects. Last vitamin D was still not at goal:  Lab Results  Component Value Date   VD25OH 30 03/24/2020     Current Outpatient Medications on File Prior to Visit  Medication Sig   albuterol HFA inhaler Inhale 2 puffs  every 4 hours as needed    aspirin 81 MG chewable tablet Chew daily.   bisoprolol-hctz 10-6.25 MG tablet TAKE ONE TABLET DAILY    VITAMIN D  Take by mouth every other day. 25,000 units     Novolin 70/30 Inject 40 units with breakfast, 35 units with dinner.   olmesartan 40 MG tablet Take 1/2 to 1 tablet at night    rosuvastatin 40 MG tablet Take 1 tab 5 days a week for cholesterol.   TURMERIC  Take by mouth daily.   metFORMIN-XR 500 MG  TAKE TWO TABS  TWICE A DAY     No Known Allergies   PMHx:   Past Medical History:  Diagnosis Date   Diabetes mellitus without complication ([place])    Hyperlipidemia    Hypertension    Morbid obesity ([place])    Plantar fasciitis    Prediabetes    Vitamin D deficiency      Immunization History  Administered Date(s) Administered   Influenza Inj Mdck Quad  02/06/2018   PFIZER SARS-COV-2 Vacc 11/19/2019   PPD Test 02/06/2018, 02/25/2019, 03/24/2020   Pneumococcal -23 01/16/2019   Tdap 03/13/2012     Past Surgical History:  Procedure Laterality Date   ROOT CANAL  2021    FHx:    Reviewed / unchanged  SHx:    Reviewed / unchanged   Systems Review:  Constitutional: Denies fever, chills, wt changes, headaches, insomnia, fatigue, night sweats, change in appetite. Eyes: Denies redness, blurred vision, diplopia, discharge, itchy, watery eyes.  ENT: Denies discharge, congestion, post nasal drip, epistaxis, sore throat, earache, hearing loss, dental pain, tinnitus, vertigo, sinus pain, snoring.  CV: Denies chest pain, palpitations, irregular heartbeat, syncope, dyspnea, diaphoresis, orthopnea, PND, claudication or edema. Respiratory: denies cough, dyspnea, DOE, pleurisy, hoarseness, laryngitis, wheezing.  Gastrointestinal: Denies dysphagia, odynophagia, heartburn, reflux, water brash, abdominal pain or cramps, nausea, vomiting, bloating, diarrhea, constipation, hematemesis, melena, hematochezia  or hemorrhoids. Genitourinary: Denies dysuria, frequency, urgency, nocturia, hesitancy, discharge, hematuria or flank pain. Musculoskeletal: Denies arthralgias, myalgias, stiffness, jt. swelling, pain, limping or strain/sprain.  Skin: Denies  pruritus, rash, hives, warts, acne, eczema or change in skin lesion(s). Neuro: No weakness, tremor, incoordination, spasms, paresthesia or pain. Psychiatric: Denies confusion, memory loss or sensory loss. Endo: Denies change in weight, skin or hair change.  Heme/Lymph: No excessive bleeding, bruising or enlarged lymph nodes.  Physical Exam  BP 126/74   Pulse 90   Temp (!) 97.5 F (36.4 C)   Resp 16   Ht '6\' 1"'$  (1.854 m)   Wt 282 lb (127.9 kg)   SpO2 98%   BMI 37.21 kg/m   Appears over nourished  and in no distress.  Eyes: PERRLA, EOMs, conjunctiva no swelling or erythema. Sinuses: No frontal/maxillary tenderness ENT/Mouth: EAC's clear, TM's nl w/o erythema, bulging. Nares clear w/o erythema, swelling, exudates. Oropharynx clear without erythema or exudates. Oral hygiene is good. Tongue normal, non obstructing. Hearing intact.  Neck: Supple. Thyroid not palpable. Car 2+/2+ without bruits, nodes or JVD. Chest: Respirations nl with BS clear & equal w/o rales, rhonchi, wheezing or stridor.  Cor: Heart sounds normal w/ regular rate and rhythm without sig. murmurs, gallops, clicks or rubs. Peripheral pulses normal and equal  without edema.  Abdomen: Soft & bowel sounds normal. Non-tender w/o guarding, rebound, hernias, masses or organomegaly.  Lymphatics: Unremarkable.  Musculoskeletal: Full ROM all peripheral extremities, joint stability, 5/5 strength and normal gait.  Skin: Warm, dry without exposed rashes, lesions or ecchymosis apparent.  Neuro: Cranial nerves intact, reflexes equal bilaterally. Sensory-motor testing grossly intact. Tendon reflexes grossly intact.  Pysch: Alert & oriented x 3.  Insight and judgement nl & appropriate. No ideations.  Assessment and Plan:  1. Essential hypertension  - Continue medication, monitor blood pressure at home.  - Continue DASH diet.  Reminder to go to the ER if any CP,  SOB, nausea, dizziness, severe HA, changes vision/speech.   - CBC  with Differential/Platelet - COMPLETE METABOLIC PANEL WITH GFR - Magnesium - TSH  2. Hyperlipidemia associated with type 2 diabetes mellitus ([place])  - Continue diet/meds, exercise,& lifestyle modifications.  - Continue monitor periodic cholesterol/liver & renal functions    - Lipid panel - TSH  3. Type 2 diabetes mellitus with stage 2 chronic kidney  disease, with long-term current use of insulin (HCC)  - Increase Ozempic from 1 -->> 2 mg /week - Continue diet, exercise  - Lifestyle modifications.  - Monitor appropriate labs   - Hemoglobin A1c  4. Vitamin D deficiency  - Continue supplementation   - VITAMIN D 25 Hydroxy   5. Medication management  - CBC with Differential/Platelet - COMPLETE METABOLIC PANEL WITH GFR - Magnesium - Lipid panel - TSH - Hemoglobin A1c - VITAMIN D 25 Hydroxy        Discussed  regular exercise, BP monitoring, weight control to achieve/maintain BMI less than 25 and discussed med and SE's. Recommended labs to assess and monitor clinical status with further disposition pending results of labs.  I discussed the assessment and treatment plan with the patient. The patient was provided an opportunity to ask questions and all were answered. The patient agreed with the plan and demonstrated an understanding of the instructions.  I provided over 30 minutes of exam, counseling, chart review and  complex critical decision making.        The patient was advised to call back or seek an in-person evaluation if the symptoms worsen or if the condition fails to improve as anticipated.   Kirtland Bouchard, MD

## 2020-10-11 ENCOUNTER — Encounter: Payer: Self-pay | Admitting: Internal Medicine

## 2020-10-11 ENCOUNTER — Other Ambulatory Visit: Payer: Self-pay

## 2020-10-11 ENCOUNTER — Ambulatory Visit (INDEPENDENT_AMBULATORY_CARE_PROVIDER_SITE_OTHER): Payer: 59 | Admitting: Internal Medicine

## 2020-10-11 VITALS — BP 126/74 | HR 90 | Temp 97.5°F | Resp 16 | Ht 73.0 in | Wt 282.0 lb

## 2020-10-11 DIAGNOSIS — N182 Chronic kidney disease, stage 2 (mild): Secondary | ICD-10-CM

## 2020-10-11 DIAGNOSIS — E559 Vitamin D deficiency, unspecified: Secondary | ICD-10-CM | POA: Diagnosis not present

## 2020-10-11 DIAGNOSIS — E1169 Type 2 diabetes mellitus with other specified complication: Secondary | ICD-10-CM

## 2020-10-11 DIAGNOSIS — E1122 Type 2 diabetes mellitus with diabetic chronic kidney disease: Secondary | ICD-10-CM

## 2020-10-11 DIAGNOSIS — I10 Essential (primary) hypertension: Secondary | ICD-10-CM | POA: Diagnosis not present

## 2020-10-11 DIAGNOSIS — Z79899 Other long term (current) drug therapy: Secondary | ICD-10-CM

## 2020-10-11 DIAGNOSIS — Z794 Long term (current) use of insulin: Secondary | ICD-10-CM

## 2020-10-11 DIAGNOSIS — E785 Hyperlipidemia, unspecified: Secondary | ICD-10-CM | POA: Diagnosis not present

## 2020-10-11 MED ORDER — OZEMPIC (1 MG/DOSE) 4 MG/3ML ~~LOC~~ SOPN: 2.0000 mg | PEN_INJECTOR | SUBCUTANEOUS | 1 refills | Status: DC

## 2020-10-11 MED ORDER — OZEMPIC (2 MG/DOSE) 8 MG/3ML ~~LOC~~ SOPN: 2.0000 mg | PEN_INJECTOR | SUBCUTANEOUS | 3 refills | Status: DC

## 2020-10-12 LAB — TSH: TSH: 2.16 mIU/L (ref 0.40–4.50)

## 2020-10-12 LAB — COMPLETE METABOLIC PANEL WITH GFR
AG Ratio: 1.3 (calc) (ref 1.0–2.5)
ALT: 36 U/L (ref 9–46)
AST: 22 U/L (ref 10–35)
Albumin: 4.1 g/dL (ref 3.6–5.1)
Alkaline phosphatase (APISO): 76 U/L (ref 35–144)
BUN: 17 mg/dL (ref 7–25)
CO2: 29 mmol/L (ref 20–32)
Calcium: 9.6 mg/dL (ref 8.6–10.3)
Chloride: 104 mmol/L (ref 98–110)
Creat: 1.07 mg/dL (ref 0.70–1.30)
Globulin: 3.1 g/dL (calc) (ref 1.9–3.7)
Glucose, Bld: 129 mg/dL — ABNORMAL HIGH (ref 65–99)
Potassium: 5.4 mmol/L — ABNORMAL HIGH (ref 3.5–5.3)
Sodium: 141 mmol/L (ref 135–146)
Total Bilirubin: 0.6 mg/dL (ref 0.2–1.2)
Total Protein: 7.2 g/dL (ref 6.1–8.1)
eGFR: 84 mL/min/{1.73_m2} (ref >=60)

## 2020-10-12 LAB — VITAMIN D 25 HYDROXY (VIT D DEFICIENCY, FRACTURES): Vit D, 25-Hydroxy: 30 ng/mL (ref 30–100)

## 2020-10-12 LAB — CBC WITH DIFFERENTIAL/PLATELET
Absolute Monocytes: 604 cells/uL (ref 200–950)
Basophils Absolute: 43 cells/uL (ref 0–200)
Basophils Relative: 0.6 %
Eosinophils Absolute: 220 cells/uL (ref 15–500)
Eosinophils Relative: 3.1 %
HCT: 42 % (ref 38.5–50.0)
Hemoglobin: 13.6 g/dL (ref 13.2–17.1)
Lymphs Abs: 2414 cells/uL (ref 850–3900)
MCH: 28.6 pg (ref 27.0–33.0)
MCHC: 32.4 g/dL (ref 32.0–36.0)
MCV: 88.4 fL (ref 80.0–100.0)
MPV: 10.7 fL (ref 7.5–12.5)
Monocytes Relative: 8.5 %
Neutro Abs: 3820 cells/uL (ref 1500–7800)
Neutrophils Relative %: 53.8 %
Platelets: 286 10*3/uL (ref 140–400)
RBC: 4.75 10*6/uL (ref 4.20–5.80)
RDW: 14.3 % (ref 11.0–15.0)
Total Lymphocyte: 34 %
WBC: 7.1 10*3/uL (ref 3.8–10.8)

## 2020-10-12 LAB — HEMOGLOBIN A1C
Hgb A1c MFr Bld: 6.7 % of total Hgb — ABNORMAL HIGH (ref <5.7)
Mean Plasma Glucose: 146 mg/dL
eAG (mmol/L): 8.1 mmol/L

## 2020-10-12 LAB — LIPID PANEL
Cholesterol: 155 mg/dL (ref <200)
HDL: 42 mg/dL (ref >=40)
LDL Cholesterol (Calc): 79 mg/dL (calc)
Non-HDL Cholesterol (Calc): 113 mg/dL (calc) (ref <130)
Total CHOL/HDL Ratio: 3.7 (calc) (ref <5.0)
Triglycerides: 246 mg/dL — ABNORMAL HIGH (ref <150)

## 2020-10-12 LAB — MAGNESIUM: Magnesium: 1.7 mg/dL (ref 1.5–2.5)

## 2020-10-12 NOTE — Progress Notes (Signed)
============================================================ - Test results slightly outside the reference range are not unusual. If there is anything important, I will review this with you,  otherwise it is considered normal test values.  If you have further questions,  please do not hesitate to contact me at the office or via My Chart.  ============================================================ ============================================================  -  Potassium is borderline elevated, most likely a lab error ============================================================ ============================================================  -  Total Chol = 155   and LDL Chol  = 79 - Both Excellent   - But Triglycerides (  246 - but much better than previous   ) or fats in blood are too high  (goal is less than 150)    - Recommend avoid fried & greasy foods,  sweets / candy,   - Avoid white rice  (brown or wild rice or Quinoa is OK),   - Avoid white potatoes  (sweet potatoes are OK)   - Avoid anything made from white flour  - bagels, doughnuts, rolls, buns, biscuits, white and   wheat breads, pizza crust and traditional  pasta made of white flour & egg white  - (vegetarian pasta or spinach or wheat pasta is OK).    - Multi-grain bread is OK - like multi-grain flat bread or  sandwich thins.   - Avoid alcohol in excess.   - Exercise is also important. ============================================================ ============================================================  -  Wow !  - A1c is also much better down from 7.8% to now 6.7%                                        (  Ideal  or Goal is less than 5.7%   !  )  ============================================================ ============================================================  - Vitamin D goal is between 70-100.   - Please make sure that you are taking your Vitamin D as directed.   - It is very important as a  natural anti-inflammatory and helping the  immune system protect against viral infections, like the Covid-19   helping hair, skin, and nails, as well as reducing stroke and  heart attack risk.   - It helps your bones and helps with mood.  - It also decreases numerous cancer risks so please  take it as directed.   - Low Vit D is associated with a 200-300% higher risk for  CANCER   and 200-300% higher risk for HEART   ATTACK  &  STROKE.    - It is also associated with higher death rate at younger ages,   autoimmune diseases like Rheumatoid arthritis, Lupus,  Multiple Sclerosis.     - Also many other serious conditions, like depression, Alzheimer's  Dementia, infertility, muscle aches, fatigue, fibromyalgia   - just to name a few. ============================================================ [id number] ============================================================  - Please let me know how much Vitamin D that you're taking,                                      so I can calculate how much to recommend to take   !   ============================================================ [id number] ============================================================  -  All Else - CBC - Kidneys - Electrolytes - Liver - Magnesium & Thyroid    - all  Normal / OK ============================================================ ============================================================

## 2020-12-03 ENCOUNTER — Other Ambulatory Visit: Payer: Self-pay

## 2020-12-03 DIAGNOSIS — I10 Essential (primary) hypertension: Secondary | ICD-10-CM

## 2020-12-03 MED ORDER — OLMESARTAN MEDOXOMIL 40 MG PO TABS: ORAL_TABLET | ORAL | 1 refills | Status: DC

## 2021-01-12 ENCOUNTER — Other Ambulatory Visit: Payer: Self-pay

## 2021-01-12 DIAGNOSIS — E1122 Type 2 diabetes mellitus with diabetic chronic kidney disease: Secondary | ICD-10-CM

## 2021-01-12 DIAGNOSIS — Z794 Long term (current) use of insulin: Secondary | ICD-10-CM

## 2021-01-12 MED ORDER — OZEMPIC (2 MG/DOSE) 8 MG/3ML ~~LOC~~ SOPN
2.0000 mg | PEN_INJECTOR | SUBCUTANEOUS | 3 refills | Status: DC
Start: 2021-01-12 — End: 2021-03-15

## 2021-02-03 ENCOUNTER — Other Ambulatory Visit: Payer: Self-pay | Admitting: Adult Health

## 2021-02-03 DIAGNOSIS — I10 Essential (primary) hypertension: Secondary | ICD-10-CM

## 2021-03-15 ENCOUNTER — Other Ambulatory Visit: Payer: Self-pay | Admitting: Internal Medicine

## 2021-03-15 DIAGNOSIS — Z794 Long term (current) use of insulin: Secondary | ICD-10-CM

## 2021-03-15 DIAGNOSIS — E1122 Type 2 diabetes mellitus with diabetic chronic kidney disease: Secondary | ICD-10-CM

## 2021-03-15 MED ORDER — OZEMPIC (2 MG/DOSE) 8 MG/3ML ~~LOC~~ SOPN: 2.0000 mg | PEN_INJECTOR | SUBCUTANEOUS | 3 refills | Status: DC

## 2021-03-21 ENCOUNTER — Other Ambulatory Visit: Payer: Self-pay

## 2021-03-21 DIAGNOSIS — E1122 Type 2 diabetes mellitus with diabetic chronic kidney disease: Secondary | ICD-10-CM

## 2021-03-21 DIAGNOSIS — N182 Chronic kidney disease, stage 2 (mild): Secondary | ICD-10-CM

## 2021-03-21 MED ORDER — OZEMPIC (2 MG/DOSE) 8 MG/3ML ~~LOC~~ SOPN: 2.0000 mg | PEN_INJECTOR | SUBCUTANEOUS | 3 refills | Status: DC

## 2021-03-24 ENCOUNTER — Ambulatory Visit: Payer: Self-pay | Admitting: Internal Medicine

## 2021-03-24 ENCOUNTER — Other Ambulatory Visit: Payer: Self-pay

## 2021-03-24 ENCOUNTER — Encounter: Payer: Self-pay | Admitting: Internal Medicine

## 2021-03-24 VITALS — BP 124/80 | HR 81 | Temp 97.9°F | Resp 16 | Ht 73.0 in | Wt 286.4 lb

## 2021-03-24 DIAGNOSIS — E0822 Diabetes mellitus due to underlying condition with diabetic chronic kidney disease: Secondary | ICD-10-CM

## 2021-03-24 DIAGNOSIS — E1169 Type 2 diabetes mellitus with other specified complication: Secondary | ICD-10-CM

## 2021-03-24 DIAGNOSIS — E559 Vitamin D deficiency, unspecified: Secondary | ICD-10-CM

## 2021-03-24 DIAGNOSIS — I10 Essential (primary) hypertension: Secondary | ICD-10-CM

## 2021-03-24 DIAGNOSIS — Z79899 Other long term (current) drug therapy: Secondary | ICD-10-CM

## 2021-03-24 NOTE — Progress Notes (Signed)
° ° °  Future Appointments  Date Time Provider Department  03/24/2021  4:00 PM Unk Pinto, MD [facility]  04/07/2021  3:00 PM Unk Pinto, MD [facility]    History of Present Illness:     Patient is a very nice 51 yo DWM with HTN & T2 NIDDM. HTn systems review is negative  and patient alleges he's taking his HT meds. WRT his Diabetes , he admits dietary indiscretions and consequent wide swings in his glucose measurements. Currently he's off of insurance , but anticipates returning to work with a former employer &having Insurance re-instated.     Medications    Novolin (70-30) 100 UNIT/ML injection, Inject 40 units with breakfast, 35 units with dinner.    metFORMIN-XR 500 MG 24 , TAKE 2 TABLETS  TWICE A DAY WITH MEALS     Semaglutide, 2 MG/DOSE, (OZEMPIC, 2 MG/DOSE,) 8 MG/3ML, Inject 2 mg into the skin once a week.    bisoprolol-hctz 10-6.25 MG tablet, Take 1 tablet daily    olmesartan 40 MG tablet, Take 1/2 to 1 tablet at night    rosuvastatin  40 MG tablet, Take 1 tab 5 days a week    albuterol HFA inhaler, Inhale 2 puffs  every 4  hours as needed    aspirin 81 MG chewable tablet, Take daily.\   VITAMIN D, Take  every other day. 25,000 units    TURMERIC , Take daily.  Problem list He has Essential hypertension; Hyperlipidemia associated with type 2 diabetes mellitus ([place]); Type 2 diabetes mellitus ([place]); Vitamin D deficiency; Morbid obesity ([place]); CKD stage 1 due to type 2 diabetes mellitus ([place]); Fatty liver; and Iron deficiency anemia on their problem list.   Observations/Objective:  BP 124/80    Pulse 81    Temp 97.9 F (36.6 C)    Resp 16    Ht 6\' 1"  (1.854 m)    Wt 286 lb 6.4 oz (129.9 kg)    SpO2 97%    BMI 37.79 kg/m   HEENT - WNL. Neck - supple.  Chest - Clear equal BS. Cor - Nl HS. RRR w/o sig MGR. PP 1(+). No edema. MS- FROM w/o deformities.  Gait Nl. Neuro -  Nl w/o focal abnormalities.   Assessment and Plan:  1. Essential hypertension  2.  Hyperlipidemia associated with type 2 diabetes mellitus ([place])  3. Type 2 diabetes mellitus with stage 2 chronic kidney  disease, with long-term current use of insulin ([place])  4. Vitamin D deficiency  5. Medication management  - Patient currently uninsured and respectfully requested to defer lab visit today.   Follow Up Instructions:        I discussed the assessment and treatment plan with the patient. The patient was provided an opportunity to ask questions and all were answered. The patient agreed with the plan and demonstrated an understanding of the instructions.       The patient was advised to call back or seek an in-person evaluation if the symptoms worsen or if the condition fails to improve as anticipated.    Kirtland Bouchard, MD

## 2021-04-05 ENCOUNTER — Telehealth: Payer: Self-pay

## 2021-04-05 NOTE — Telephone Encounter (Signed)
Prior Auth for Ozempic approved through 04/04/22

## 2021-04-07 ENCOUNTER — Ambulatory Visit (INDEPENDENT_AMBULATORY_CARE_PROVIDER_SITE_OTHER): Payer: Managed Care, Other (non HMO) | Admitting: Internal Medicine

## 2021-04-07 DIAGNOSIS — Z0000 Encounter for general adult medical examination without abnormal findings: Secondary | ICD-10-CM

## 2021-04-07 NOTE — Progress Notes (Signed)
° ° °  3rd     No    Show                                                                                                                                                                         Future Appointments  Date Time Provider Department  04/07/2021  3:00 PM Unk Pinto, MD [facility]  04/12/2022  3:00 PM Unk Pinto, MD [facility]            This very nice 51 y.o. DWM presents for a Screening /Preventative Visit & comprehensive evaluation and management of multiple medical co-morbidities.  Patient has been followed for HTN, HLD, T2_NIDDM  and Vitamin D Deficiency.       HTN predates circa 1995.  Patient's BP has been controlled at home.  Today's  . Patient denies any cardiac symptoms as chest pain, palpitations, shortness of breath, dizziness or ankle swelling.       Patient's hyperlipidemia is controlled with diet and medications. Patient denies myalgias or other medication SE's. Last lipids were at goal except elevated Trig's :  Lab Results  Component Value Date   CHOL 155 10/11/2020   HDL 42 10/11/2020   LDLCALC 79 10/11/2020   TRIG 246 (H) 10/11/2020   CHOLHDL 3.7 10/11/2020         Patient has hx/o Morbid Obesity  (BMI 36.9) and T2_NIDDM (A1c 8.4% /2018) and Patient was started on Novolin 70/30 bid in Sept 2020.  Patient denies reactive hypoglycemic symptoms, visual blurring, diabetic polys or paresthesias. Last A1c was not at goal :   Lab Results  Component Value Date   HGBA1C 6.7 (H) 10/11/2020          Finally, patient has history of Vitamin D Deficiency ("21" /2018) and last vitamin D was still very low :   Lab Results  Component Value Date   VD25OH 30 10/11/2020

## 2021-04-07 NOTE — Patient Instructions (Signed)

## 2021-05-16 ENCOUNTER — Other Ambulatory Visit: Payer: Self-pay | Admitting: Adult Health

## 2021-05-16 DIAGNOSIS — E119 Type 2 diabetes mellitus without complications: Secondary | ICD-10-CM

## 2021-06-06 ENCOUNTER — Other Ambulatory Visit: Payer: Self-pay | Admitting: Nurse Practitioner

## 2021-06-06 DIAGNOSIS — I10 Essential (primary) hypertension: Secondary | ICD-10-CM

## 2021-07-12 ENCOUNTER — Encounter: Payer: Self-pay | Admitting: Internal Medicine

## 2021-07-12 ENCOUNTER — Ambulatory Visit (INDEPENDENT_AMBULATORY_CARE_PROVIDER_SITE_OTHER): Payer: Self-pay | Admitting: Internal Medicine

## 2021-07-12 VITALS — BP 122/80 | HR 95 | Temp 97.9°F | Resp 16 | Ht 73.0 in | Wt 287.8 lb

## 2021-07-12 DIAGNOSIS — E785 Hyperlipidemia, unspecified: Secondary | ICD-10-CM

## 2021-07-12 DIAGNOSIS — E1169 Type 2 diabetes mellitus with other specified complication: Secondary | ICD-10-CM

## 2021-07-12 DIAGNOSIS — I10 Essential (primary) hypertension: Secondary | ICD-10-CM

## 2021-07-12 DIAGNOSIS — E559 Vitamin D deficiency, unspecified: Secondary | ICD-10-CM

## 2021-07-12 DIAGNOSIS — Z79899 Other long term (current) drug therapy: Secondary | ICD-10-CM

## 2021-07-12 DIAGNOSIS — E039 Hypothyroidism, unspecified: Secondary | ICD-10-CM

## 2021-07-12 DIAGNOSIS — Z794 Long term (current) use of insulin: Secondary | ICD-10-CM

## 2021-07-12 DIAGNOSIS — N182 Chronic kidney disease, stage 2 (mild): Secondary | ICD-10-CM

## 2021-07-12 DIAGNOSIS — E1122 Type 2 diabetes mellitus with diabetic chronic kidney disease: Secondary | ICD-10-CM

## 2021-07-12 MED ORDER — TIRZEPATIDE 10 MG/0.5ML ~~LOC~~ SOAJ: 10.0000 mg | SUBCUTANEOUS | 0 refills | Status: DC

## 2021-07-12 NOTE — Progress Notes (Signed)
? ?Future Appointments  ?Date Time Provider Department  ?10/17/2021 10:30 AM Unk Pinto, MD [facility]  ? ? ?History of Present Illness: ? ?    This very nice 51 y.o. DWM presents for  follow up with HTN, HLD, T2 NIDDM and Vitamin D Deficiency.  ? ? ?    Patient is treated for HTN  since 1995  & BP has been controlled at home. Today?s BP is at goal  -  122/80. Patient has had no complaints of any cardiac type chest pain, palpitations, dyspnea / orthopnea / PND, dizziness, claudication, or dependent edema. ? ? ?    Hyperlipidemia is controlled with diet & meds. Patient denies myalgias or other med SE?s. Last Lipids were at goal except elevated Trig's : ? ?Lab Results  ?Component Value Date  ? CHOL 155 10/11/2020  ? HDL 42 10/11/2020  ? [place] 79 10/11/2020  ? TRIG 246 (H) 10/11/2020  ? CHOLHDL 3.7 10/11/2020  ? ? ? ?Also, the patient has history of hx/o Morbid Obesity  (BMI 36.9) and consequent T2_NIDDM  (A1c 8.4% /2018) . Patient was started on Novolin 70/30 bid in Sept 2020. He has been titrated up on Ozempic over the last year & has not lost weight .  Patient  has had no symptoms of reactive hypoglycemia, diabetic polys, paresthesias or visual blurring.  Last A1c was not at goal : ? ?Lab Results  ?Component Value Date  ? HGBA1C 6.7 (H) 10/11/2020  ? ?Wt Readings from Last 3 Encounters:  ?07/12/21 287 lb 12.8 oz (130.5 kg)  ?03/24/21 286 lb 6.4 oz (129.9 kg)  ?10/11/20 282 lb (127.9 kg)  ? ? ?                                                 Further, the patient also has history of Vitamin D Deficiency ("21" /2018) and does not supplement recommended vitamin D. Last vitamin D was still very low : ? ?Lab Results  ?Component Value Date  ? VD25OH 30 10/11/2020  ? ? ? ?Current Outpatient Medications on File Prior to Visit  ?Medication Sig  ? aspirin 81 MG tablet take daily.  ? bisoprolol-hctz 10-6.25 MG tablet Take 1 tablet  Daily for BP - Must have Office Visit before further refills !  ? VITAMIN D Take by  mouth every other day. 25,000 units  (Patient not taking: Reported on 03/24/2021)  ? insulin NPH-regular 70-30 Inject 40 units with breakfast, 35 units with dinner.  ? metFORMIN -XR  500 MG 2 TAKE 2 TABLETS  TWICE DAILY   ? olmesartan 40 MG tablet Take 1/2 to 1 tablet at night   ? rosuvastatin 40 MG tablet Take 1 tab 5 days a week for cholesterol.  ? Semaglutide, 2 MG/DOSE, (OZEMPIC, 2 MG/DOSE,) 8 MG/3ML SOPN Inject 2 mg into the skin once a week.  ? TURMERIC Take  daily.  ? ?No Known Allergies ? ? ?PMHx:   ?Past Medical History:  ?Diagnosis Date  ? Diabetes mellitus without complication ([place])   ? Hyperlipidemia   ? Hypertension   ? Morbid obesity ([place])   ? Plantar fasciitis   ? Prediabetes   ? Vitamin D deficiency   ? ? ? ?Immunization History  ?Administered Date(s) Administered  ? Influenza Inj Mdck Quad  02/06/2018  ? PFIZER-SARS-COV-2 Vacc 11/19/2019  ?  PPD Test 02/06/2018, 02/25/2019, 03/24/2020  ? Pneumococcal -23 01/16/2019  ? Tdap 03/13/2012  ? ? ? ?Past Surgical History:  ?Procedure Laterality Date  ? ROOT CANAL  2021  ? ? ?FHx:    Reviewed / unchanged ? ?SHx:    Reviewed / unchanged  ? ?Systems Review: ? ?Constitutional: Denies fever, chills, wt changes, headaches, insomnia, fatigue, night sweats, change in appetite. ?Eyes: Denies redness, blurred vision, diplopia, discharge, itchy, watery eyes.  ?ENT: Denies discharge, congestion, post nasal drip, epistaxis, sore throat, earache, hearing loss, dental pain, tinnitus, vertigo, sinus pain, snoring.  ?CV: Denies chest pain, palpitations, irregular heartbeat, syncope, dyspnea, diaphoresis, orthopnea, PND, claudication or edema. ?Respiratory: denies cough, dyspnea, DOE, pleurisy, hoarseness, laryngitis, wheezing.  ?Gastrointestinal: Denies dysphagia, odynophagia, heartburn, reflux, water brash, abdominal pain or cramps, nausea, vomiting, bloating, diarrhea, constipation, hematemesis, melena, hematochezia  or hemorrhoids. ?Genitourinary: Denies dysuria,  frequency, urgency, nocturia, hesitancy, discharge, hematuria or flank pain. ?Musculoskeletal: Denies arthralgias, myalgias, stiffness, jt. swelling, pain, limping or strain/sprain.  ?Skin: Denies pruritus, rash, hives, warts, acne, eczema or change in skin lesion(s). ?Neuro: No weakness, tremor, incoordination, spasms, paresthesia or pain. ?Psychiatric: Denies confusion, memory loss or sensory loss. ?Endo: Denies change in weight, skin or hair change.  ?Heme/Lymph: No excessive bleeding, bruising or enlarged lymph nodes. ? ?Physical Exam ? ?BP 122/80   Pulse 95   Temp 97.9 ?F (36.6 ?C)   Resp 16   Ht '6\' 1"'$  (1.854 m)   Wt 287 lb 12.8 oz (130.5 kg)   SpO2 96%   BMI 37.97 kg/m?  ? ?Appears  well nourished, well groomed  and in no distress. ? ?Eyes: PERRLA, EOMs, conjunctiva no swelling or erythema. ?Sinuses: No frontal/maxillary tenderness ?ENT/Mouth: EAC's clear, TM's nl w/o erythema, bulging. Nares clear w/o erythema, swelling, exudates. Oropharynx clear without erythema or exudates. Oral hygiene is good. Tongue normal, non obstructing. Hearing intact.  ?Neck: Supple. Thyroid not palpable. Car 2+/2+ without bruits, nodes or JVD. ?Chest: Respirations nl with BS clear & equal w/o rales, rhonchi, wheezing or stridor.  ?Cor: Heart sounds normal w/ regular rate and rhythm without sig. murmurs, gallops, clicks or rubs. Peripheral pulses normal and equal  without edema.  ?Abdomen: Soft & bowel sounds normal. Non-tender w/o guarding, rebound, hernias, masses or organomegaly.  ?Lymphatics: Unremarkable.  ?Musculoskeletal: Full ROM all peripheral extremities, joint stability, 5/5 strength and normal gait.  ?Skin: Warm, dry without exposed rashes, lesions or ecchymosis apparent.  ?Neuro: Cranial nerves intact, reflexes equal bilaterally. Sensory-motor testing grossly intact. Tendon reflexes grossly intact.  ?Pysch: Alert & oriented x 3.  Insight and judgement nl & appropriate. No ideations. ? ?Assessment and Plan: ? ?1.  Essential hypertension ? ?- Continue medication, monitor blood pressure at home.  ?- Continue DASH diet.  Reminder to go to the ER if any CP,  ?SOB, nausea, dizziness, severe HA, changes vision/speech. ?   ?- CBC with Differential/Platelet ?- COMPLETE METABOLIC PANEL WITH GFR ? ?2. H ?- Continue diet/meds, exercise,& lifestyle modifications.  ?- Continue monitor periodic cholesterol/liver & renal functions  ?  yperlipidemia associated with type 2 diabetes mellitus ([place]) ? ?- Lipid panel ? ?3. Type 2 diabetes mellitus with stage 2 chronic kidney  ?disease, with long-term current use of insulin ([place]) ? ?- Hemoglobin A1c ? ?- tirzepatide (MOUNJARO) 10 MG/0.5ML Pen;  ?Inject 10 mg into the skin once a week.   ?Dispense: 2 mL; Refill: 0 ? ?- Continue diet, exercise  ?- Lifestyle modifications.  ?- Monitor appropriate labs ?  ?  4. Vitamin D deficiency ? ?- Continue supplementation ?   ?5. Hypothyroidism ? ? ?6. Medication management ? ?- CBC with Differential/Platelet ?- COMPLETE METABOLIC PANEL WITH GFR ?- Lipid panel ?- Hemoglobin A1c ? ? ?      Discussed  regular exercise, BP monitoring, weight control to achieve/maintain BMI less than 25 and discussed med and SE's. Recommended labs to assess /monitor clinical status .  I discussed the assessment and treatment plan with the patient. The patient was provided an opportunity to ask questions and all were answered. The patient agreed with the plan and demonstrated an understanding of the instructions.  I provided over 30 minutes of exam, counseling, chart review and  complex critical decision making. ? ?      The patient was advised to call back or seek an in-person evaluation if the symptoms worsen or if the condition fails to improve as anticipated. ? ? ?Kirtland Bouchard, MD ? ?

## 2021-07-13 LAB — CBC WITH DIFFERENTIAL/PLATELET
Absolute Monocytes: 711 cells/uL (ref 200–950)
Basophils Absolute: 63 cells/uL (ref 0–200)
Basophils Relative: 0.7 %
Eosinophils Absolute: 279 cells/uL (ref 15–500)
Eosinophils Relative: 3.1 %
HCT: 38.7 % (ref 38.5–50.0)
Hemoglobin: 12.7 g/dL — ABNORMAL LOW (ref 13.2–17.1)
Lymphs Abs: 2493 cells/uL (ref 850–3900)
MCH: 28.9 pg (ref 27.0–33.0)
MCHC: 32.8 g/dL (ref 32.0–36.0)
MCV: 88.2 fL (ref 80.0–100.0)
MPV: 11.2 fL (ref 7.5–12.5)
Monocytes Relative: 7.9 %
Neutro Abs: 5454 cells/uL (ref 1500–7800)
Neutrophils Relative %: 60.6 %
Platelets: 264 10*3/uL (ref 140–400)
RBC: 4.39 10*6/uL (ref 4.20–5.80)
RDW: 14.3 % (ref 11.0–15.0)
Total Lymphocyte: 27.7 %
WBC: 9 10*3/uL (ref 3.8–10.8)

## 2021-07-13 LAB — COMPLETE METABOLIC PANEL WITH GFR
AG Ratio: 1.3 (calc) (ref 1.0–2.5)
ALT: 23 U/L (ref 9–46)
AST: 17 U/L (ref 10–35)
Albumin: 4.1 g/dL (ref 3.6–5.1)
Alkaline phosphatase (APISO): 87 U/L (ref 35–144)
BUN: 18 mg/dL (ref 7–25)
CO2: 29 mmol/L (ref 20–32)
Calcium: 9.5 mg/dL (ref 8.6–10.3)
Chloride: 108 mmol/L (ref 98–110)
Creat: 0.9 mg/dL (ref 0.70–1.30)
Globulin: 3.1 g/dL (calc) (ref 1.9–3.7)
Glucose, Bld: 97 mg/dL (ref 65–99)
Potassium: 5.4 mmol/L — ABNORMAL HIGH (ref 3.5–5.3)
Sodium: 142 mmol/L (ref 135–146)
Total Bilirubin: 0.4 mg/dL (ref 0.2–1.2)
Total Protein: 7.2 g/dL (ref 6.1–8.1)
eGFR: 103 mL/min/{1.73_m2} (ref >=60)

## 2021-07-13 LAB — HEMOGLOBIN A1C
Hgb A1c MFr Bld: 6.2 % of total Hgb — ABNORMAL HIGH (ref <5.7)
Mean Plasma Glucose: 131 mg/dL
eAG (mmol/L): 7.3 mmol/L

## 2021-07-13 LAB — LIPID PANEL
Cholesterol: 148 mg/dL (ref <200)
HDL: 36 mg/dL — ABNORMAL LOW (ref >=40)
LDL Cholesterol (Calc): 79 mg/dL (calc)
Non-HDL Cholesterol (Calc): 112 mg/dL (calc) (ref <130)
Total CHOL/HDL Ratio: 4.1 (calc) (ref <5.0)
Triglycerides: 248 mg/dL — ABNORMAL HIGH (ref <150)

## 2021-07-13 NOTE — Progress Notes (Signed)
<><><><><><><><><><><><><><><><><><><><><><><><><><><><><><><><><> ?<><><><><><><><><><><><><><><><><><><><><><><><><><><><><><><><><> ?-   Test results slightly outside the reference range are not unusual. ?If there is anything important, I will review this with you,  ?otherwise it is considered normal test values.  ?If you have further questions,  ?please do not hesitate to contact me at the office or via My Chart.  ?<><><><><><><><><><><><><><><><><><><><><><><><><><><><><><><><><> ?<><><><><><><><><><><><><><><><><><><><><><><><><><><><><><><><><> ? ?-  CBC  - Normal  / OK ? ?Chemistry panel shows potassium borderline or slightly elevated, So ? ? ?                                     avoid  salt substitutes , like " No Salt " or Morton's  Lite Salt ?<><><><><><><><><><><><><><><><><><><><><><><><><><><><><><><><><> ? ?-  Total Chol = 148    &   LDL Chol = 79   - Both  Excellent  ? ?- Very low risk for Heart Attack  / Stroke ?<><><><><><><><><><><><><><><><><><><><><><><><><><><><><><><><><> ? ?-  But Triglycerides (  248   ) or fats in blood are too high  ?                 (  Ideal or Goal is less than 150)   ? ?- Recommend avoid fried & greasy foods,  sweets / candy,  ? ?- Avoid white rice  ?(brown or wild rice or Quinoa is OK),  ? ?- Avoid white potatoes  ?(sweet potatoes are OK)  ? ?- Avoid anything made from white flour  ?- bagels, doughnuts, rolls, buns, biscuits, white and  ? ?wheat breads, pizza crust and traditional  ?pasta made of white flour & egg white ? ?- (vegetarian pasta or spinach or wheat pasta is OK).   ? ?- Multi-grain bread is OK - like multi-grain flat bread or ? sandwich thins.  ? ?- Avoid alcohol in excess.  ? ?- Exercise is also important. ?<><><><><><><><><><><><><><><><><><><><><><><><><><><><><><><><><> ? ?- A1c - much better, down from 7.8%  and 6.7% to Now 6.2 %   ?<><><><><><><><><><><><><><><><><><><><><><><><><><><><><><><><><> ?<><><><><><><><><><><><><><><><><><><><><><><><><><><><><><><><><> ? ?- Keep up the [place] Work  ! !  ? ?<><><><><><><><><><><><><><><><><><><><><><><><><><><><><><><><><> ?<><><><><><><><><><><><><><><><><><><><><><><><><><><><><><><><><> ? ? ? ? ? ? ? ? ? ? ? ? ?

## 2021-07-15 ENCOUNTER — Other Ambulatory Visit: Payer: Self-pay | Admitting: Internal Medicine

## 2021-07-15 MED ORDER — TRULICITY 4.5 MG/0.5ML ~~LOC~~ SOAJ: SUBCUTANEOUS | 0 refills | Status: DC

## 2021-07-19 ENCOUNTER — Other Ambulatory Visit: Payer: Self-pay | Admitting: Internal Medicine

## 2021-07-19 DIAGNOSIS — I10 Essential (primary) hypertension: Secondary | ICD-10-CM

## 2021-07-19 MED ORDER — BISOPROLOL-HYDROCHLOROTHIAZIDE 10-6.25 MG PO TABS: ORAL_TABLET | ORAL | 0 refills | Status: DC

## 2021-08-11 ENCOUNTER — Other Ambulatory Visit: Payer: Self-pay | Admitting: Internal Medicine

## 2021-08-11 ENCOUNTER — Other Ambulatory Visit: Payer: Self-pay | Admitting: Nurse Practitioner

## 2021-08-11 ENCOUNTER — Other Ambulatory Visit: Payer: Self-pay | Admitting: Adult Health

## 2021-08-11 DIAGNOSIS — E119 Type 2 diabetes mellitus without complications: Secondary | ICD-10-CM

## 2021-08-11 DIAGNOSIS — E782 Mixed hyperlipidemia: Secondary | ICD-10-CM

## 2021-08-31 ENCOUNTER — Other Ambulatory Visit: Payer: Self-pay | Admitting: Nurse Practitioner

## 2021-09-30 ENCOUNTER — Other Ambulatory Visit: Payer: Self-pay | Admitting: Internal Medicine

## 2021-09-30 DIAGNOSIS — E1122 Type 2 diabetes mellitus with diabetic chronic kidney disease: Secondary | ICD-10-CM

## 2021-09-30 MED ORDER — TRULICITY 4.5 MG/0.5ML ~~LOC~~ SOAJ: SUBCUTANEOUS | 3 refills | Status: DC

## 2021-10-01 ENCOUNTER — Other Ambulatory Visit: Payer: Self-pay | Admitting: Internal Medicine

## 2021-10-01 DIAGNOSIS — E1122 Type 2 diabetes mellitus with diabetic chronic kidney disease: Secondary | ICD-10-CM

## 2021-10-01 MED ORDER — TRULICITY 4.5 MG/0.5ML ~~LOC~~ SOAJ: SUBCUTANEOUS | 3 refills | Status: DC

## 2021-10-02 ENCOUNTER — Other Ambulatory Visit: Payer: Self-pay | Admitting: Internal Medicine

## 2021-10-02 DIAGNOSIS — E1122 Type 2 diabetes mellitus with diabetic chronic kidney disease: Secondary | ICD-10-CM

## 2021-10-02 MED ORDER — TRULICITY 4.5 MG/0.5ML ~~LOC~~ SOAJ: SUBCUTANEOUS | 3 refills | Status: DC

## 2021-10-09 ENCOUNTER — Other Ambulatory Visit: Payer: Self-pay | Admitting: Internal Medicine

## 2021-10-09 DIAGNOSIS — I10 Essential (primary) hypertension: Secondary | ICD-10-CM

## 2021-10-16 NOTE — Progress Notes (Signed)
Future Appointments  Date Time Provider [place]  10/11/2020 10:30 AM Unk Pinto, MD [facility] None  04/07/2021  3:00 PM Unk Pinto, MD [facility] None    History of Present Illness:       This very nice 51 y.o. DWM presents  for 3 month follow up with HTN, HLD, T2_Insulin Requiring DM and Vitamin D Deficiency.        Patient is treated for HTN (1995)  & BP has been controlled at home. Today's BP is atgoal - 130/88 .  Patient has had no complaints of any cardiac type chest pain, palpitations, dyspnea Vertell Limber /PND, dizziness, claudication, or dependent edema.       Hyperlipidemia is controlled with diet & meds. Patient denies myalgias or other med SE's. 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  Patient does report that he has been doing better since on Ozempic with less over eating. Last A1c was not at goal:  Lab Results  Component Value Date   HGBA1C 6.2 (H) 07/12/2021   Wt Readings from Last 3 Encounters:  10/17/21 283 lb 3.2 oz (128.5 kg)  07/12/21 287 lb 12.8 oz (130.5 kg)  03/24/21 286 lb 6.4 oz (129.9 kg)        In 2018, patient had mild Hypothyroidism with elevated TSH which subsequently recovered  suggesting  transient Thyroiditis.                                                   Further, the patient also has history of Vitamin D Deficiency ("21"/2018) and supplements vitamin D without any suspected side-effects. Last vitamin D was still not at goal:  Lab Results  Component Value Date   VD25OH 30 10/11/2020        Current Outpatient Medications on File Prior to Visit  Medication Sig   albuterol HFA inhaler Inhale 2 puffs  every 4 hours as needed    aspirin 81 MG chewable tablet Chew daily.   bisoprolol-hctz 10-6.25 MG tablet TAKE ONE TABLET DAILY    VITAMIN D  Take by mouth every other day. 25,000 units    Novolin 70/30 Inject 40 units with breakfast, 35 units with dinner.   olmesartan 40 MG tablet Take 1/2 to 1 tablet at night    rosuvastatin 40 MG tablet Take 1 tab 5 days a week for cholesterol.   TURMERIC  Take by mouth daily.   metFORMIN-XR 500 MG  TAKE TWO TABS  TWICE A DAY     No Known Allergies   PMHx:   Past Medical History:  Diagnosis Date   Diabetes mellitus without complication ([place])    Hyperlipidemia    Hypertension    Morbid obesity ([place])    Plantar fasciitis    Prediabetes    Vitamin D deficiency      Immunization History  Administered Date(s) Administered   Influenza Inj Mdck Quad  02/06/2018   PFIZER SARS-COV-2 Vacc 11/19/2019   PPD Test 02/06/2018, 02/25/2019, 03/24/2020   Pneumococcal -23 01/16/2019   Tdap 03/13/2012     Past Surgical History:  Procedure Laterality Date   ROOT CANAL  2021    FHx:    Reviewed / unchanged  SHx:    Reviewed / unchanged   Systems Review:  Constitutional: Denies fever, chills, wt changes, headaches, insomnia, fatigue, night sweats, change in appetite. Eyes: Denies redness, blurred vision, diplopia, discharge, itchy, watery eyes.  ENT: Denies discharge, congestion, post nasal drip, epistaxis, sore throat, earache, hearing loss, dental pain, tinnitus, vertigo, sinus pain, snoring.  CV: Denies chest pain, palpitations, irregular heartbeat, syncope, dyspnea, diaphoresis, orthopnea, PND, claudication or edema. Respiratory: denies cough, dyspnea, DOE, pleurisy, hoarseness, laryngitis, wheezing.  Gastrointestinal: Denies dysphagia, odynophagia, heartburn, reflux, water brash, abdominal pain or cramps, nausea, vomiting,  bloating, diarrhea, constipation, hematemesis, melena, hematochezia  or hemorrhoids. Genitourinary: Denies dysuria, frequency, urgency, nocturia, hesitancy, discharge, hematuria or flank pain. Musculoskeletal: Denies arthralgias, myalgias, stiffness, jt. swelling, pain, limping or strain/sprain.  Skin: Denies pruritus, rash, hives, warts, acne, eczema or change in skin lesion(s). Neuro: No weakness, tremor, incoordination, spasms, paresthesia or pain. Psychiatric: Denies confusion, memory loss or sensory loss. Endo: Denies change in weight, skin or hair change.  Heme/Lymph: No excessive bleeding, bruising or enlarged lymph nodes.  Physical Exam  BP 130/88   Pulse 79   Temp 97.9 F (36.6 C)   Resp 16   Ht '6\' 1"'$  (1.854 m)   Wt 283 lb 3.2 oz (128.5 kg)   SpO2 97%   BMI 37.36 kg/m   Appears over nourished  and in no distress.  Eyes: PERRLA, EOMs, conjunctiva no swelling or erythema. Sinuses: No frontal/maxillary tenderness ENT/Mouth: EAC's clear, TM's nl w/o erythema, bulging. Nares clear w/o erythema, swelling, exudates. Oropharynx clear without erythema or exudates. Oral hygiene is good. Tongue normal, non obstructing. Hearing intact.  Neck: Supple. Thyroid not palpable. Car 2+/2+ without bruits, nodes or JVD. Chest: Respirations nl with BS clear & equal w/o rales, rhonchi, wheezing or stridor.  Cor: Heart sounds normal w/ regular rate and rhythm without sig. murmurs, gallops, clicks or rubs. Peripheral pulses normal and equal  without edema.  Abdomen: Soft & bowel sounds normal. Non-tender w/o guarding, rebound, hernias, masses or organomegaly.  Lymphatics: Unremarkable.  Musculoskeletal: Full ROM all peripheral extremities, joint stability, 5/5 strength and normal gait.  Skin: Warm, dry without exposed rashes, lesions or ecchymosis apparent.  Neuro: Cranial nerves intact, reflexes equal bilaterally. Sensory-motor testing grossly intact. Tendon reflexes grossly intact.  Pysch: Alert  & oriented x 3.  Insight and judgement nl & appropriate. No ideations.  Assessment and Plan:  1. Essential hypertension   2. Hyperlipidemia associated with type 2 diabetes mellitus ([place])   3. Type 2 diabetes mellitus with stage 2 chronic kidney  disease, with long-term current use of insulin ([place])  - long discussion re:  importance of diet & weight loss.   4. Vitamin D deficiency   5. History of thyroiditis   6. Class 2 severe obesity due to excess calories with serious comorbidity  and body mass index (BMI) of 37.0 to 37.9 in adult ([place])   7. Medication management         Discussed  regular exercise, BP monitoring, weight control to achieve/maintain BMI less than 25 and discussed med and SE's.  Patient's current insurance does not cover labs and  he indicates intention to obtain "better" insurance in the next few months , so he defers labs today.  I discussed the assessment and treatment plan with the patient. The patient was provided an opportunity to ask questions and all were answered. The patient agreed with the plan and demonstrated an understanding of the instructions.  I provided over 30 minutes of exam, counseling, chart review and  complex critical decision making.        The patient was advised to call back or seek an in-person evaluation if the symptoms worsen or if the condition fails to improve as anticipated.   Kirtland Bouchard, MD

## 2021-10-17 ENCOUNTER — Encounter: Payer: Self-pay | Admitting: Internal Medicine

## 2021-10-17 ENCOUNTER — Ambulatory Visit (INDEPENDENT_AMBULATORY_CARE_PROVIDER_SITE_OTHER): Payer: Managed Care, Other (non HMO) | Admitting: Internal Medicine

## 2021-10-17 ENCOUNTER — Ambulatory Visit: Payer: Managed Care, Other (non HMO) | Admitting: Internal Medicine

## 2021-10-17 VITALS — BP 130/88 | HR 79 | Temp 97.9°F | Resp 16 | Ht 73.0 in | Wt 283.2 lb

## 2021-10-17 DIAGNOSIS — E559 Vitamin D deficiency, unspecified: Secondary | ICD-10-CM

## 2021-10-17 DIAGNOSIS — Z79899 Other long term (current) drug therapy: Secondary | ICD-10-CM

## 2021-10-17 DIAGNOSIS — E66812 Obesity, class 2: Secondary | ICD-10-CM

## 2021-10-17 DIAGNOSIS — Z6837 Body mass index (BMI) 37.0-37.9, adult: Secondary | ICD-10-CM

## 2021-10-17 DIAGNOSIS — E785 Hyperlipidemia, unspecified: Secondary | ICD-10-CM

## 2021-10-17 DIAGNOSIS — N182 Chronic kidney disease, stage 2 (mild): Secondary | ICD-10-CM

## 2021-10-17 DIAGNOSIS — E1169 Type 2 diabetes mellitus with other specified complication: Secondary | ICD-10-CM

## 2021-10-17 DIAGNOSIS — E1122 Type 2 diabetes mellitus with diabetic chronic kidney disease: Secondary | ICD-10-CM

## 2021-10-17 DIAGNOSIS — Z794 Long term (current) use of insulin: Secondary | ICD-10-CM

## 2021-10-17 DIAGNOSIS — I10 Essential (primary) hypertension: Secondary | ICD-10-CM

## 2021-10-17 DIAGNOSIS — Z8639 Personal history of other endocrine, nutritional and metabolic disease: Secondary | ICD-10-CM

## 2021-10-17 NOTE — Patient Instructions (Signed)

## 2021-10-20 ENCOUNTER — Other Ambulatory Visit: Payer: Self-pay

## 2021-10-20 DIAGNOSIS — E1122 Type 2 diabetes mellitus with diabetic chronic kidney disease: Secondary | ICD-10-CM

## 2021-10-20 MED ORDER — ACCU-CHEK SOFTCLIX LANCETS MISC: 12 refills | Status: DC

## 2021-10-29 ENCOUNTER — Other Ambulatory Visit: Payer: Self-pay | Admitting: Nurse Practitioner

## 2021-10-29 DIAGNOSIS — E782 Mixed hyperlipidemia: Secondary | ICD-10-CM

## 2022-01-16 ENCOUNTER — Other Ambulatory Visit: Payer: Self-pay | Admitting: Internal Medicine

## 2022-01-16 DIAGNOSIS — E1122 Type 2 diabetes mellitus with diabetic chronic kidney disease: Secondary | ICD-10-CM

## 2022-01-16 MED ORDER — TIRZEPATIDE 5 MG/0.5ML ~~LOC~~ SOAJ: SUBCUTANEOUS | 0 refills | Status: DC

## 2022-01-16 MED ORDER — TRULICITY 4.5 MG/0.5ML ~~LOC~~ SOAJ: SUBCUTANEOUS | 3 refills | Status: DC

## 2022-01-17 ENCOUNTER — Encounter: Payer: Self-pay | Admitting: Internal Medicine

## 2022-01-17 ENCOUNTER — Other Ambulatory Visit: Payer: Self-pay | Admitting: Internal Medicine

## 2022-01-17 DIAGNOSIS — N182 Chronic kidney disease, stage 2 (mild): Secondary | ICD-10-CM

## 2022-01-17 MED ORDER — OZEMPIC (1 MG/DOSE) 4 MG/3ML ~~LOC~~ SOPN: PEN_INJECTOR | SUBCUTANEOUS | 0 refills | Status: DC

## 2022-01-17 NOTE — Patient Instructions (Signed)

## 2022-01-17 NOTE — Progress Notes (Signed)
Future Appointments  Date Time Provider Department  01/18/2022  9:30 AM Unk Pinto, MD [facility]    History of Present Illness:      This very nice 51 y.o. DWM presents  for 3 month follow up with HTN, HLD, T2_Insulin Requiring DM and Vitamin D Deficiency.        Patient is treated for HTN (1995)  & BP has been controlled at home. Today's BP is at goal - 126/78.   Patient has had no complaints of any cardiac type chest pain, palpitations, dyspnea Vertell Limber /PND, dizziness, claudication, or dependent edema.       Hyperlipidemia is controlled with diet & meds. Patient denies myalgias or other med SE's. 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  Patient does report that he has been doing better since on Ozempic with less over eating.  Weight is down  16# over the last 6 months.  Last A1c was not at goal :  Lab Results  Component Value Date   HGBA1C 6.2 (H) 07/12/2021   Wt Readings from Last 3 Encounters:  01/18/22 271 lb (122.9 kg)  10/17/21 283 lb 3.2 oz (128.5 kg)  07/12/21 287 lb 12.8 oz (130.5 kg)         In 2018, patient had mild Hypothyroidism with elevated TSH which subsequently recovered  suggesting  transient Thyroiditis.                                                   Further, the patient also has history of Vitamin D Deficiency ("21"/2018) and supplements vitamin D without any suspected side-effects. Last vitamin D was still not at goal :  Lab Results  Component Value Date   VD25OH 30 10/11/2020       Current Outpatient  Medications on File Prior to Visit  Medication Sig   albuterol HFA inhaler Inhale 2 puffs  every 4 hours as needed    aspirin 81 MG chewable tablet Chew daily.   bisoprolol-hctz 10-6.25 MG tablet TAKE ONE TABLET DAILY    VITAMIN D  Take by mouth every other day. 25,000 units    Novolin 70/30 Inject 40 units with breakfast, 35 units with dinner.   olmesartan 40 MG tablet Take 1/2 to 1 tablet at night    rosuvastatin 40 MG tablet Take 1 tab 5 days a week for cholesterol.   TURMERIC  Take by mouth daily.   metFORMIN-XR 500 MG  TAKE TWO TABS  TWICE A DAY     No Known Allergies   PMHx:   Past Medical History:  Diagnosis Date   Diabetes mellitus without complication ([place])    Hyperlipidemia    Hypertension    Morbid obesity ([place])    Plantar fasciitis    Prediabetes    Vitamin D deficiency      Immunization History  Administered  Date(s) Administered   Influenza Inj Mdck Quad  02/06/2018   PFIZER SARS-COV-2 Vacc 11/19/2019   PPD Test 02/06/2018, 02/25/2019, 03/24/2020   Pneumococcal -23 01/16/2019   Tdap 03/13/2012     Past Surgical History:  Procedure Laterality Date   ROOT CANAL  2021    FHx:    Reviewed / unchanged  SHx:    Reviewed / unchanged   Systems Review:  Constitutional: Denies fever, chills, wt changes, headaches, insomnia, fatigue, night sweats, change in appetite. Eyes: Denies redness, blurred vision, diplopia, discharge, itchy, watery eyes.  ENT: Denies discharge, congestion, post nasal drip, epistaxis, sore throat, earache, hearing loss, dental pain, tinnitus, vertigo, sinus pain, snoring.  CV: Denies chest pain, palpitations, irregular heartbeat, syncope, dyspnea, diaphoresis, orthopnea, PND, claudication or edema. Respiratory: denies cough, dyspnea, DOE, pleurisy, hoarseness, laryngitis, wheezing.  Gastrointestinal: Denies dysphagia, odynophagia, heartburn, reflux, water brash, abdominal pain or cramps, nausea, vomiting, bloating, diarrhea,  constipation, hematemesis, melena, hematochezia  or hemorrhoids. Genitourinary: Denies dysuria, frequency, urgency, nocturia, hesitancy, discharge, hematuria or flank pain. Musculoskeletal: Denies arthralgias, myalgias, stiffness, jt. swelling, pain, limping or strain/sprain.  Skin: Denies pruritus, rash, hives, warts, acne, eczema or change in skin lesion(s). Neuro: No weakness, tremor, incoordination, spasms, paresthesia or pain. Psychiatric: Denies confusion, memory loss or sensory loss. Endo: Denies change in weight, skin or hair change.  Heme/Lymph: No excessive bleeding, bruising or enlarged lymph nodes.  Physical Exam  BP 126/78   Pulse 86   Temp 97.9 F (36.6 C)   Resp 16   Ht '6\' 1"'$  (1.854 m)   Wt 271 lb (122.9 kg)   SpO2 97%   BMI 35.75 kg/m   Appears over nourished  and in no distress.  Eyes: PERRLA, EOMs, conjunctiva no swelling or erythema. Sinuses: No frontal/maxillary tenderness ENT/Mouth: EAC's clear, TM's nl w/o erythema, bulging. Nares clear w/o erythema, swelling, exudates. Oropharynx clear without erythema or exudates. Oral hygiene is good. Tongue normal, non obstructing. Hearing intact.  Neck: Supple. Thyroid not palpable. Car 2+/2+ without bruits, nodes or JVD. Chest: Respirations nl with BS clear & equal w/o rales, rhonchi, wheezing or stridor.  Cor: Heart sounds normal w/ regular rate and rhythm without sig. murmurs, gallops, clicks or rubs. Peripheral pulses normal and equal  without edema.  Abdomen: Soft & bowel sounds normal. Non-tender w/o guarding, rebound, hernias, masses or organomegaly.  Lymphatics: Unremarkable.  Musculoskeletal: Full ROM all peripheral extremities, joint stability, 5/5 strength and normal gait.  Skin: Warm, dry without exposed rashes, lesions or ecchymosis apparent.  Neuro: Cranial nerves intact, reflexes equal bilaterally. Sensory-motor testing grossly intact. Tendon reflexes grossly intact.  Pysch: Alert & oriented x 3.  Insight  and judgement nl & appropriate. No ideations.  Assessment and Plan:   1. Essential hypertension  - CBC with Differential/Platelet - COMPLETE METABOLIC PANEL WITH GFR - Magnesium - TSH  2. Hyperlipidemia associated with type 2 diabetes mellitus ([facility])  - Lipid panel - TSH  3. Type 2 diabetes mellitus with stage 2 chronic kidney  disease, with long-term current use of insulin ([facility])  - Hemoglobin A1c - Insulin, random  4. Vitamin D deficiency  - VITAMIN D 25 Hydroxy   5. Class 2 severe obesity due to excess calories with serious  comorbidity and body mass index (BMI) of 37.0 to 37.9 in adult ([facility])  - TSH  6. Medication management  - CBC with Differential/Platelet - COMPLETE METABOLIC PANEL WITH GFR - Magnesium - Lipid panel - TSH - Hemoglobin A1c - Insulin,  random - VITAMIN D 25 Hydroxy          Discussed  regular exercise, BP monitoring, weight control to achieve/maintain BMI less than 25 and discussed med and SE's.   I discussed the assessment and treatment plan with the patient. The patient was provided an opportunity to ask questions and all were answered. The patient agreed with the plan and demonstrated an understanding of the instructions.  I provided over 30 minutes of exam, counseling, chart review and  complex critical decision making.        The patient was advised to call back or seek an in-person evaluation if the symptoms worsen or if the condition fails to improve as anticipated.   Kirtland Bouchard, MD

## 2022-01-18 ENCOUNTER — Ambulatory Visit: Payer: Managed Care, Other (non HMO) | Admitting: Internal Medicine

## 2022-01-18 ENCOUNTER — Encounter: Payer: Self-pay | Admitting: Internal Medicine

## 2022-01-18 VITALS — BP 126/78 | HR 86 | Temp 97.9°F | Resp 16 | Ht 73.0 in | Wt 271.0 lb

## 2022-01-18 DIAGNOSIS — E1122 Type 2 diabetes mellitus with diabetic chronic kidney disease: Secondary | ICD-10-CM | POA: Diagnosis not present

## 2022-01-18 DIAGNOSIS — Z23 Encounter for immunization: Secondary | ICD-10-CM | POA: Diagnosis not present

## 2022-01-18 DIAGNOSIS — N182 Chronic kidney disease, stage 2 (mild): Secondary | ICD-10-CM

## 2022-01-18 DIAGNOSIS — E1169 Type 2 diabetes mellitus with other specified complication: Secondary | ICD-10-CM

## 2022-01-18 DIAGNOSIS — I10 Essential (primary) hypertension: Secondary | ICD-10-CM | POA: Diagnosis not present

## 2022-01-18 DIAGNOSIS — E559 Vitamin D deficiency, unspecified: Secondary | ICD-10-CM | POA: Diagnosis not present

## 2022-01-18 DIAGNOSIS — Z79899 Other long term (current) drug therapy: Secondary | ICD-10-CM | POA: Diagnosis not present

## 2022-01-18 DIAGNOSIS — E785 Hyperlipidemia, unspecified: Secondary | ICD-10-CM | POA: Diagnosis not present

## 2022-01-18 DIAGNOSIS — E66812 Obesity, class 2: Secondary | ICD-10-CM

## 2022-01-18 DIAGNOSIS — B079 Viral wart, unspecified: Secondary | ICD-10-CM

## 2022-01-19 ENCOUNTER — Other Ambulatory Visit: Payer: Self-pay | Admitting: Internal Medicine

## 2022-01-19 LAB — CBC WITH DIFFERENTIAL/PLATELET
Absolute Monocytes: 676 cells/uL (ref 200–950)
Basophils Absolute: 18 cells/uL (ref 0–200)
Basophils Relative: 0.2 %
Eosinophils Absolute: 187 cells/uL (ref 15–500)
Eosinophils Relative: 2.1 %
HCT: 39.5 % (ref 38.5–50.0)
Hemoglobin: 13.3 g/dL (ref 13.2–17.1)
Lymphs Abs: 1931 cells/uL (ref 850–3900)
MCH: 29.7 pg (ref 27.0–33.0)
MCHC: 33.7 g/dL (ref 32.0–36.0)
MCV: 88.2 fL (ref 80.0–100.0)
MPV: 10.4 fL (ref 7.5–12.5)
Monocytes Relative: 7.6 %
Neutro Abs: 6088 cells/uL (ref 1500–7800)
Neutrophils Relative %: 68.4 %
Platelets: 281 10*3/uL (ref 140–400)
RBC: 4.48 10*6/uL (ref 4.20–5.80)
RDW: 14.4 % (ref 11.0–15.0)
Total Lymphocyte: 21.7 %
WBC: 8.9 10*3/uL (ref 3.8–10.8)

## 2022-01-19 LAB — HEMOGLOBIN A1C
Hgb A1c MFr Bld: 6 % of total Hgb — ABNORMAL HIGH (ref <5.7)
Mean Plasma Glucose: 126 mg/dL
eAG (mmol/L): 7 mmol/L

## 2022-01-19 LAB — VITAMIN D 25 HYDROXY (VIT D DEFICIENCY, FRACTURES): Vit D, 25-Hydroxy: 27 ng/mL — ABNORMAL LOW (ref 30–100)

## 2022-01-19 LAB — LIPID PANEL
Cholesterol: 158 mg/dL (ref <200)
HDL: 41 mg/dL (ref >=40)
LDL Cholesterol (Calc): 81 mg/dL (calc)
Non-HDL Cholesterol (Calc): 117 mg/dL (calc) (ref <130)
Total CHOL/HDL Ratio: 3.9 (calc) (ref <5.0)
Triglycerides: 276 mg/dL — ABNORMAL HIGH (ref <150)

## 2022-01-19 LAB — COMPLETE METABOLIC PANEL WITH GFR
AG Ratio: 1.4 (calc) (ref 1.0–2.5)
ALT: 26 U/L (ref 9–46)
AST: 21 U/L (ref 10–35)
Albumin: 4.3 g/dL (ref 3.6–5.1)
Alkaline phosphatase (APISO): 87 U/L (ref 35–144)
BUN: 20 mg/dL (ref 7–25)
CO2: 26 mmol/L (ref 20–32)
Calcium: 9.9 mg/dL (ref 8.6–10.3)
Chloride: 103 mmol/L (ref 98–110)
Creat: 0.94 mg/dL (ref 0.70–1.30)
Globulin: 3.1 g/dL (calc) (ref 1.9–3.7)
Glucose, Bld: 117 mg/dL — ABNORMAL HIGH (ref 65–99)
Potassium: 4.5 mmol/L (ref 3.5–5.3)
Sodium: 138 mmol/L (ref 135–146)
Total Bilirubin: 0.4 mg/dL (ref 0.2–1.2)
Total Protein: 7.4 g/dL (ref 6.1–8.1)
eGFR: 98 mL/min/{1.73_m2} (ref >=60)

## 2022-01-19 LAB — MAGNESIUM: Magnesium: 1.8 mg/dL (ref 1.5–2.5)

## 2022-01-19 LAB — TSH: TSH: 1.39 mIU/L (ref 0.40–4.50)

## 2022-01-19 LAB — INSULIN, RANDOM: Insulin: 60.8 u[IU]/mL — ABNORMAL HIGH

## 2022-01-20 NOTE — Progress Notes (Signed)
<><><><><><><><><><><><><><><><><><><><><><><><><><><><><><><><><> <><><><><><><><><><><><><><><><><><><><><><><><><><><><><><><><><> -   Test results slightly outside the reference range are not unusual. If there is anything important, I will review this with you,  otherwise it is considered normal test values.  If you have further questions,  please do not hesitate to contact me at the office or via My Chart.  <><><><><><><><><><><><><><><><><><><><><><><><><><><><><><><><><> <><><><><><><><><><><><><><><><><><><><><><><><><><><><><><><><><>  -  Glucose = 117 mg%      and     A1c = 6.0%  = 12 week average glucose = 126  mg%                                                         Great - So begin gradual Insulin taper as discussed. <><><><><><><><><><><><><><><><><><><><><><><><><><><><><><><><><>  -  Vitamin D = 27  - Still Extremely & Dangerously Low  !   Please               Please                           Please                                    Buy Vitamin D 5,000 unit capsules  &                                                   Take 2 capsules = 10,000 units EVERY Day  <><><><><><><><><><><><><><><><><><><><><><><><><><><><><><><><><> <><><><><><><><><><><><><><><><><><><><><><><><><><><><><><><><><>  -   Magnesium   =  1.8     is very  low- goal is betw 2.0 - 2.5,   - So..............Marland Kitchen  Recommend that you take 2 tablets  /day of   Magnesium 500 mg   - also important to eat lots of  leafy green vegetables   - spinach - Kale - collards - greens - okra - asparagus  - broccoli - quinoa - squash - almonds   - black, red, white beans  -  peas - green beans  <><><><><><><><><><><><><><><><><><><><><><><><><><><><><><><><><>  -All Else - CBC - Kidneys - Electrolytes - Liver - Magnesium & Thyroid    - all  Normal /  OK <><><><><><><><><><><><><><><><><><><><><><><><><><><><><><><><><> <><><><><><><><><><><><><><><><><><><><><><><><><><><><><><><><><>

## 2022-01-22 ENCOUNTER — Encounter: Payer: Self-pay | Admitting: Internal Medicine

## 2022-01-26 ENCOUNTER — Other Ambulatory Visit: Payer: Self-pay | Admitting: Internal Medicine

## 2022-01-26 DIAGNOSIS — I10 Essential (primary) hypertension: Secondary | ICD-10-CM

## 2022-04-12 ENCOUNTER — Encounter: Payer: Managed Care, Other (non HMO) | Admitting: Internal Medicine

## 2022-04-20 ENCOUNTER — Other Ambulatory Visit: Payer: Self-pay | Admitting: Internal Medicine

## 2022-04-20 DIAGNOSIS — E1122 Type 2 diabetes mellitus with diabetic chronic kidney disease: Secondary | ICD-10-CM

## 2022-04-26 ENCOUNTER — Ambulatory Visit: Payer: Managed Care, Other (non HMO) | Admitting: Nurse Practitioner

## 2022-05-03 ENCOUNTER — Ambulatory Visit: Payer: Managed Care, Other (non HMO) | Admitting: Nurse Practitioner

## 2022-05-03 ENCOUNTER — Encounter: Payer: Self-pay | Admitting: Nurse Practitioner

## 2022-05-03 VITALS — BP 122/88 | HR 85 | Temp 98.2°F | Wt 265.0 lb

## 2022-05-03 DIAGNOSIS — E1169 Type 2 diabetes mellitus with other specified complication: Secondary | ICD-10-CM | POA: Diagnosis not present

## 2022-05-03 DIAGNOSIS — K760 Fatty (change of) liver, not elsewhere classified: Secondary | ICD-10-CM | POA: Diagnosis not present

## 2022-05-03 DIAGNOSIS — E785 Hyperlipidemia, unspecified: Secondary | ICD-10-CM

## 2022-05-03 DIAGNOSIS — E559 Vitamin D deficiency, unspecified: Secondary | ICD-10-CM

## 2022-05-03 DIAGNOSIS — I10 Essential (primary) hypertension: Secondary | ICD-10-CM

## 2022-05-03 DIAGNOSIS — E1122 Type 2 diabetes mellitus with diabetic chronic kidney disease: Secondary | ICD-10-CM

## 2022-05-03 DIAGNOSIS — Z79899 Other long term (current) drug therapy: Secondary | ICD-10-CM

## 2022-05-03 DIAGNOSIS — N181 Chronic kidney disease, stage 1: Secondary | ICD-10-CM

## 2022-05-03 MED ORDER — OZEMPIC (2 MG/DOSE) 8 MG/3ML ~~LOC~~ SOPN: 2.0000 mg | PEN_INJECTOR | SUBCUTANEOUS | 2 refills | Status: DC

## 2022-05-03 NOTE — Patient Instructions (Signed)

## 2022-05-03 NOTE — Progress Notes (Signed)
FOLLOW UP  Assessment and Plan:   Hypertension Continue asa, bisoprolol-HCTZ Discontinue Olmesartan  Well controlled with current medications  Monitor blood pressure at home; patient to call if consistently greater than 130/80 Continue DASH diet.   Reminder to go to the ER if any CP, SOB, nausea, dizziness, severe HA, changes vision/speech, left arm numbness and tingling and jaw pain.  Fatty Liver Monitor LFTs Limit Tylenol and Alcohol use  Cholesterol Continue Rosuvastatin Discussed lifestyle modifications. Recommended diet heavy in fruits and veggies, omega 3's. Decrease consumption of animal meats, cheeses, and dairy products. Remain active and exercise as tolerated. Continue to monitor. Check lipids/TSH   Diabetes with CKD Continue Semaglutide, Metformin Discontinue NPH insulin Education: Reviewed 'ABCs' of diabetes management  Discussed goals to be met and/or maintained include A1C (<7) Blood pressure (<130/80) Cholesterol (LDL <70) Continue Eye Exam yearly  Continue Dental Exam Q6 mo Discussed dietary recommendations Discussed Physical Activity recommendations Foot exam UTD Check A1C  CKD stage 1 w/DM Discussed how what you eat and drink can aide in kidney protection. Stay well hydrated. Avoid high salt foods. Avoid NSAIDS. Keep BP and BG well controlled.   Take medications as prescribed. Remain active and exercise as tolerated daily. Maintain weight.  Continue to monitor. Check CMP/GFR/Microablumin  Vitamin D Deficiency Below goal of 60-100 last check 01/2022 Continue supplement Monitor levels  Obesity with co morbidities Improving Discussed appropriate BMI Diet modification. Physical activity. Encouraged/praised to build confidence.  Medication management All medications discussed and reviewed in full. All questions and concerns regarding medications addressed.    Orders Placed This Encounter  Procedures   CBC with Differential/Platelet    COMPLETE METABOLIC PANEL WITH GFR   Lipid panel   Hemoglobin A1c   VITAMIN D 25 Hydroxy (Vit-D Deficiency, Fractures)   Notify office for further evaluation and treatment, questions or concerns if any reported s/s fail to improve.   The patient was advised to call back or seek an in-person evaluation if any symptoms worsen or if the condition fails to improve as anticipated.   Further disposition pending results of labs. Discussed med's effects and SE's.    I discussed the assessment and treatment plan with the patient. The patient was provided an opportunity to ask questions and all were answered. The patient agreed with the plan and demonstrated an understanding of the instructions.  Discussed med's effects and SE's. Screening labs and tests as requested with regular follow-up as recommended.  I provided 25 minutes of face-to-face time during this encounter including counseling, chart review, and critical decision making was preformed.   Future Appointments  Date Time Provider [place]  09/01/2022 10:00 AM Unk Pinto, MD [facility] None    ----------------------------------------------------------------------------------------------------------------------  HPI 52 y.o. male  presents for 3 month follow up on hypertension, cholesterol, diabetes, weight and vitamin D deficiency.   Overall he reports feeling well today.  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    BMI is Body mass index is 34.96 kg/m., he has been working on diet and exercise. Wt Readings from Last 3 Encounters:  05/03/22 265 lb (120.2 kg)  01/18/22 271 lb (122.9 kg)  10/17/21 283 lb 3.2 oz (128.5 kg)    His blood pressure has been controlled at home, today their BP is BP: 122/88 He has not been taking Olmesartan. Has been out of medication for several week.  He has continued Bisoprolol-HCTZ with asa.   He does  not workout. He denies chest pain,  shortness of breath, dizziness.   He is on cholesterol medication Rosuvastatin and denies myalgias. His cholesterol is at goal. The cholesterol last visit was:   Lab Results  Component Value Date   CHOL 158 01/18/2022   HDL 41 01/18/2022   LDLCALC 81 01/18/2022   TRIG 276 (H) 01/18/2022   CHOLHDL 3.9 01/18/2022    He has been working on diet and exercise for prediabetes, and denies polydipsia and polyuria. Continues on Ozeempic 1 mg, Metformin, and NPH.  Has been taking NPH insulin on PRN.  Sugars have been averaging <100 without inuslin.  Last A1C in the office was:  Lab Results  Component Value Date   HGBA1C 6.0 (H) 01/18/2022   Patient is on Vitamin D supplement.   Lab Results  Component Value Date   VD25OH 27 (L) 01/18/2022     CKD is stable.  Last GFR:   Lab Results  Component Value Date   EGFR 98 01/18/2022   EGFR 103 07/12/2021   EGFR 84 10/11/2020      Current Medications:  Current Outpatient Medications on File Prior to Visit  Medication Sig   Accu-Chek Softclix Lancets lancets Use as instructed   albuterol (VENTOLIN HFA) 108 (90 Base) MCG/ACT inhaler Inhale 2 puffs into the lungs every 4 (four) hours as needed for wheezing or shortness of breath. Please give generic or the one that insurance covers   aspirin 81 MG chewable tablet Chew by mouth daily.   bisoprolol-hydrochlorothiazide (ZIAC) 10-6.25 MG tablet Take 1 tablet by mouth once daily for blood pressure   Blood Glucose Monitoring Suppl DEVI Check glucose 4 times a day and PRN.   glucose blood test strip Check Blood Sugar 4 x /day  ->  before meals & bedtime   insulin NPH-regular Human (70-30) 100 UNIT/ML injection Inject 40 units with breakfast, 35 units with dinner.   metFORMIN (GLUCOPHAGE-XR) 500 MG 24 hr tablet TAKE 2 TABLETS BY MOUTH TWICE DAILY WITH MEALS FOR DIABETES   olmesartan (BENICAR) 40 MG tablet Take 1/2 to 1 tablet at night for BP & Diabetic Kidney Protection   rosuvastatin (CRESTOR) 40 MG  tablet Take  1 tablet   5 x  /week for Cholesterol                                                   /                                TAKE                                BY                             MOUTH                           ONCE DAILY   Semaglutide, 1 MG/DOSE, (OZEMPIC, 1 MG/DOSE,) 4 MG/3ML SOPN INJECT 1 MG SUBCUTANEOUSLY ONCE A WEEK FOR DIABETES   TURMERIC PO Take by mouth daily.   Cholecalciferol (VITAMIN D HIGH POTENCY PO) Take by mouth every  other day. 25,000 units (Patient not taking: Reported on 01/18/2022)   No current facility-administered medications on file prior to visit.     Allergies: Not on File   Medical History:  Past Medical History:  Diagnosis Date   Diabetes mellitus without complication ([place])    Hyperlipidemia    Hypertension    Morbid obesity ([place])    Plantar fasciitis    Prediabetes    Vitamin D deficiency    Family history- Reviewed and unchanged Social history- Reviewed and unchanged   Review of Systems:  ROS    Physical Exam: BP 122/88   Pulse 85   Temp 98.2 F (36.8 C)   Wt 265 lb (120.2 kg)   SpO2 97%   BMI 34.96 kg/m  Wt Readings from Last 3 Encounters:  05/03/22 265 lb (120.2 kg)  01/18/22 271 lb (122.9 kg)  10/17/21 283 lb 3.2 oz (128.5 kg)   General Appearance: Well nourished, in no apparent distress. Eyes: PERRLA, EOMs, conjunctiva no swelling or erythema Sinuses: No Frontal/maxillary tenderness ENT/Mouth: Ext aud canals clear, TMs without erythema, bulging. No erythema, swelling, or exudate on post pharynx.  Tonsils not swollen or erythematous. Hearing normal.  Neck: Supple, thyroid normal.  Respiratory: Respiratory effort normal, BS equal bilaterally without rales, rhonchi, wheezing or stridor.  Cardio: RRR with no MRGs. Brisk peripheral pulses without edema.  Abdomen: Soft, + BS.  Non tender, no guarding, rebound, hernias, masses. Lymphatics: Non tender without lymphadenopathy.  Musculoskeletal: Full ROM, 5/5 strength,  Normal gait Skin: Warm, dry without rashes, lesions, ecchymosis.  Neuro: Cranial nerves intact. No cerebellar symptoms.  Psych: Awake and oriented X 3, normal affect, Insight and Judgment appropriate.    Darrol Jump, NP 10:09 AM [facility]

## 2022-05-04 LAB — CBC WITH DIFFERENTIAL/PLATELET
Absolute Monocytes: 654 cells/uL (ref 200–950)
Basophils Absolute: 43 cells/uL (ref 0–200)
Basophils Relative: 0.5 %
Eosinophils Absolute: 232 cells/uL (ref 15–500)
Eosinophils Relative: 2.7 %
HCT: 40.8 % (ref 38.5–50.0)
Hemoglobin: 13.9 g/dL (ref 13.2–17.1)
Lymphs Abs: 1849 cells/uL (ref 850–3900)
MCH: 28.6 pg (ref 27.0–33.0)
MCHC: 34.1 g/dL (ref 32.0–36.0)
MCV: 84 fL (ref 80.0–100.0)
MPV: 10.5 fL (ref 7.5–12.5)
Monocytes Relative: 7.6 %
Neutro Abs: 5822 cells/uL (ref 1500–7800)
Neutrophils Relative %: 67.7 %
Platelets: 257 10*3/uL (ref 140–400)
RBC: 4.86 10*6/uL (ref 4.20–5.80)
RDW: 14.9 % (ref 11.0–15.0)
Total Lymphocyte: 21.5 %
WBC: 8.6 10*3/uL (ref 3.8–10.8)

## 2022-05-04 LAB — COMPLETE METABOLIC PANEL WITH GFR
AG Ratio: 1.4 (calc) (ref 1.0–2.5)
ALT: 18 U/L (ref 9–46)
AST: 17 U/L (ref 10–35)
Albumin: 4.1 g/dL (ref 3.6–5.1)
Alkaline phosphatase (APISO): 84 U/L (ref 35–144)
BUN: 14 mg/dL (ref 7–25)
CO2: 28 mmol/L (ref 20–32)
Calcium: 9.5 mg/dL (ref 8.6–10.3)
Chloride: 106 mmol/L (ref 98–110)
Creat: 0.86 mg/dL (ref 0.70–1.30)
Globulin: 3 g/dL (calc) (ref 1.9–3.7)
Glucose, Bld: 89 mg/dL (ref 65–99)
Potassium: 4.6 mmol/L (ref 3.5–5.3)
Sodium: 141 mmol/L (ref 135–146)
Total Bilirubin: 0.4 mg/dL (ref 0.2–1.2)
Total Protein: 7.1 g/dL (ref 6.1–8.1)
eGFR: 104 mL/min/{1.73_m2} (ref >=60)

## 2022-05-04 LAB — LIPID PANEL
Cholesterol: 150 mg/dL (ref <200)
HDL: 42 mg/dL (ref >=40)
LDL Cholesterol (Calc): 78 mg/dL (calc)
Non-HDL Cholesterol (Calc): 108 mg/dL (calc) (ref <130)
Total CHOL/HDL Ratio: 3.6 (calc) (ref <5.0)
Triglycerides: 200 mg/dL — ABNORMAL HIGH (ref <150)

## 2022-05-04 LAB — HEMOGLOBIN A1C
Hgb A1c MFr Bld: 6 % of total Hgb — ABNORMAL HIGH (ref <5.7)
Mean Plasma Glucose: 126 mg/dL
eAG (mmol/L): 7 mmol/L

## 2022-05-04 LAB — VITAMIN D 25 HYDROXY (VIT D DEFICIENCY, FRACTURES): Vit D, 25-Hydroxy: 31 ng/mL (ref 30–100)

## 2022-05-08 ENCOUNTER — Other Ambulatory Visit: Payer: Self-pay | Admitting: Nurse Practitioner

## 2022-05-08 DIAGNOSIS — I10 Essential (primary) hypertension: Secondary | ICD-10-CM

## 2022-06-03 ENCOUNTER — Other Ambulatory Visit: Payer: Self-pay | Admitting: Nurse Practitioner

## 2022-06-03 DIAGNOSIS — E119 Type 2 diabetes mellitus without complications: Secondary | ICD-10-CM

## 2022-08-01 ENCOUNTER — Other Ambulatory Visit: Payer: Self-pay | Admitting: Internal Medicine

## 2022-08-01 ENCOUNTER — Other Ambulatory Visit: Payer: Self-pay | Admitting: Nurse Practitioner

## 2022-08-01 DIAGNOSIS — I10 Essential (primary) hypertension: Secondary | ICD-10-CM

## 2022-08-01 MED ORDER — OZEMPIC (2 MG/DOSE) 8 MG/3ML ~~LOC~~ SOPN: PEN_INJECTOR | SUBCUTANEOUS | 1 refills | Status: DC

## 2022-08-31 ENCOUNTER — Encounter: Payer: Self-pay | Admitting: Internal Medicine

## 2022-08-31 NOTE — Patient Instructions (Signed)
Due to recent changes in healthcare laws, you may see the results of your imaging and laboratory studies on MyChart before your provider has had a chance to review them.  We understand that in some cases there may be results that are confusing or concerning to you. Not all laboratory results come back in the same time frame and the provider may be waiting for multiple results in order to interpret others.  Please give us 48 hours in order for your provider to thoroughly review all the results before contacting the office for clarification of your results.   ++++++++++++++++++++++++++++++++++++++  Vit D  & Vit C 1,000 mg   are recommended to help protect  against the Covid-19 and other Corona viruses.    Also it's recommended  to take  Zinc 50 mg  to help  protect against the Covid-19   and best place to get  is also on [employer]  and don't pay more than 6-8 cents /pill !  =============================== Coronavirus (COVID-19) Are you at risk?  Are you at risk for the Coronavirus (COVID-19)?  To be considered HIGH RISK for Coronavirus (COVID-19), you have to meet the following criteria:  Traveled to [place], [place], [place], [place] or [place]; or in the [place] to [place], [place], [place]  or [place]; and have fever, cough, and shortness of breath within the last 2 weeks of travel OR Been in close contact with a person diagnosed with COVID-19 within the last 2 weeks and have  fever, cough,and shortness of breath  IF YOU DO NOT MEET THESE CRITERIA, YOU ARE CONSIDERED LOW RISK FOR COVID-19.  What to do if you are HIGH RISK for COVID-19?  If you are having a medical emergency, call 911. Seek medical care right away. Before you go to a doctor's office, urgent care or emergency department,  call ahead and tell them about your recent travel, contact with someone diagnosed with COVID-19   and your symptoms.  You should receive instructions from your physician's office  regarding next steps of care.  When you arrive at healthcare provider, tell the healthcare staff immediately you have returned from  visiting [place], [place], [place], [place] or [place]; or traveled in the [place] to [place], [place],  [place] or [place] in the last two weeks or you have been in close contact with a person diagnosed with  COVID-19 in the last 2 weeks.   Tell the health care staff about your symptoms: fever, cough and shortness of breath. After you have been seen by a medical provider, you will be either: Tested for (COVID-19) and discharged home on quarantine except to seek medical care if  symptoms worsen, and asked to  Stay home and avoid contact with others until you get your results (4-5 days)  Avoid travel on public transportation if possible (such as bus, train, or airplane) or Sent to the Emergency Department by EMS for evaluation, COVID-19 testing  and  possible admission depending on your condition and test results.  What to do if you are LOW RISK for COVID-19?  Reduce your risk of any infection by using the same precautions used for avoiding the common cold or flu:  Wash your hands often with soap and warm water for at least 20 seconds.  If soap and water are not readily available,  use an alcohol-based hand sanitizer with at least 60% alcohol.  If coughing or sneezing, cover your mouth and nose by coughing   or sneezing into the elbow areas of your shirt or coat,  into a tissue or into your sleeve (not your hands). Avoid shaking hands with others and consider head nods or verbal greetings only. Avoid touching your eyes, nose, or mouth with unwashed hands.  Avoid close contact with people who are sick. Avoid places or events with large numbers of people in one location, like concerts or sporting events. Carefully consider travel plans you have or are making. If you are planning any travel outside or inside the [place], visit the CDC's Travelers' Health  webpage for the latest health notices. If you have some symptoms but not all symptoms, continue to monitor at home and seek medical attention  if your symptoms worsen. If you are having a medical emergency, call 911. >>>>>>>>>>>>>>>>>>>>>>>>>>>> Preventive Care for Adults  A healthy lifestyle and preventive care can promote health and wellness. Preventive health guidelines for men include the following key practices: A routine yearly physical is a good way to check with your health care provider about your health and preventative screening. It is a chance to share any concerns and updates on your health and to receive a thorough exam. Visit your dentist for a routine exam and preventative care every 6 months. Brush your teeth twice a day and floss once a day. Good oral hygiene prevents tooth decay and gum disease. The frequency of eye exams is based on your age, health, family medical history, use of contact lenses, and other factors. Follow your health care provider's recommendations for frequency of eye exams. Eat a healthy diet. Foods such as vegetables, fruits, whole grains, low-fat dairy products, and lean protein foods contain the nutrients you need without too many calories. Decrease your intake of foods high in solid fats, added sugars, and salt. Eat the right amount of calories for you. Get information about a proper diet from your health care provider, if necessary. Regular physical exercise is one of the most important things you can do for your health. Most adults should get at least 150 minutes of moderate-intensity exercise (any activity that increases your heart rate and causes you to sweat) each week. In addition, most adults need muscle-strengthening exercises on 2 or more days a week. Maintain a healthy weight. The body mass index (BMI) is a screening tool to identify possible weight problems. It provides an estimate of body fat based on height and weight. Your health care provider can  find your BMI and can help you achieve or maintain a healthy weight. For adults 20 years and older: A BMI below 18.5 is considered underweight. A BMI of 18.5 to 24.9 is normal. A BMI of 25 to 29.9 is considered overweight. A BMI of 30 and above is considered obese. Maintain normal blood lipids and cholesterol levels by exercising and minimizing your intake of saturated fat. Eat a balanced diet with plenty of fruit and vegetables. Blood tests for lipids and cholesterol should begin at age 20 and be repeated every 5 years. If your lipid or cholesterol levels are high, you are over 50, or you are at high risk for heart disease, you may need your cholesterol levels checked more frequently. Ongoing high lipid and cholesterol levels should be treated with medicines if diet and exercise are not working. If you smoke, find out from your health care provider how to quit. If you do not use tobacco, do not start. Lung cancer screening is recommended for adults aged 55-80 years who are at high risk for   developing lung cancer because of a history of smoking. A yearly low-dose CT scan of the lungs is recommended for people who have at least a 30-pack-year history of smoking and are a current smoker or have quit within the past 15 years. A pack year of smoking is smoking an average of 1 pack of cigarettes a day for 1 year (for example: 1 pack a day for 30 years or 2 packs a day for 15 years). Yearly screening should continue until the smoker has stopped smoking for at least 15 years. Yearly screening should be stopped for people who develop a health problem that would prevent them from having lung cancer treatment. If you choose to drink alcohol, do not have more than 2 drinks per day. One drink is considered to be 12 ounces (355 mL) of beer, 5 ounces (148 mL) of wine, or 1.5 ounces (44 mL) of liquor. Avoid use of street drugs. Do not share needles with anyone. Ask for help if you need support or instructions about  stopping the use of drugs. High blood pressure causes heart disease and increases the risk of stroke. Your blood pressure should be checked at least every 1-2 years. Ongoing high blood pressure should be treated with medicines, if weight loss and exercise are not effective. If you are 45-79 years old, ask your health care provider if you should take aspirin to prevent heart disease. Diabetes screening involves taking a blood sample to check your fasting blood sugar level. This should be done once every 3 years, after age 45, if you are within normal weight and without risk factors for diabetes. Testing should be considered at a younger age or be carried out more frequently if you are overweight and have at least 1 risk factor for diabetes. Colorectal cancer can be detected and often prevented. Most routine colorectal cancer screening begins at the age of 50 and continues through age 75. However, your health care provider may recommend screening at an earlier age if you have risk factors for colon cancer. On a yearly basis, your health care provider may provide home test kits to check for hidden blood in the stool. Use of a small camera at the end of a tube to directly examine the colon (sigmoidoscopy or colonoscopy) can detect the earliest forms of colorectal cancer. Talk to your health care provider about this at age 50, when routine screening begins. Direct exam of the colon should be repeated every 5-10 years through age 75, unless early forms of precancerous polyps or small growths are found.  Talk with your health care provider about prostate cancer screening. Testicular cancer screening isrecommended for adult males. Screening includes self-exam, a health care provider exam, and other screening tests. Consult with your health care provider about any symptoms you have or any concerns you have about testicular cancer. Use sunscreen. Apply sunscreen liberally and repeatedly throughout the day. You should  seek shade when your shadow is shorter than you. Protect yourself by wearing long sleeves, pants, a wide-brimmed hat, and sunglasses year round, whenever you are outdoors. Once a month, do a whole-body skin exam, using a mirror to look at the skin on your back. Tell your health care provider about new moles, moles that have irregular borders, moles that are larger than a pencil eraser, or moles that have changed in shape or color. Stay current with required vaccines (immunizations). Influenza vaccine. All adults should be immunized every year. Tetanus, diphtheria, and acellular pertussis (Td, Tdap) vaccine. An   adult who has not previously received Tdap or who does not know his vaccine status should receive 1 dose of Tdap. This initial dose should be followed by tetanus and diphtheria toxoids (Td) booster doses every 10 years. Adults with an unknown or incomplete history of completing a 3-dose immunization series with Td-containing vaccines should begin or complete a primary immunization series including a Tdap dose. Adults should receive a Td booster every 10 years. Varicella vaccine. An adult without evidence of immunity to varicella should receive 2 doses or a second dose if he has previously received 1 dose. Human papillomavirus (HPV) vaccine. Males aged 13-21 years who have not received the vaccine previously should receive the 3-dose series. Males aged 22-26 years may be immunized. Immunization is recommended through the age of 26 years for any male who has sex with males and did not get any or all doses earlier. Immunization is recommended for any person with an immunocompromised condition through the age of 26 years if he did not get any or all doses earlier. During the 3-dose series, the second dose should be obtained 4-8 weeks after the first dose. The third dose should be obtained 24 weeks after the first dose and 16 weeks after the second dose. Zoster vaccine. One dose is recommended for adults  aged 60 years or older unless certain conditions are present.  PREVNAR  - Pneumococcal 13-valent conjugate (PCV13) vaccine. When indicated, a person who is uncertain of his immunization history and has no record of immunization should receive the PCV13 vaccine. An adult aged 19 years or older who has certain medical conditions and has not been previously immunized should receive 1 dose of PCV13 vaccine. This PCV13 should be followed with a dose of pneumococcal polysaccharide (PPSV23) vaccine. The PPSV23 vaccine dose should be obtained at least 1 r more year(s) after the dose of PCV13 vaccine. An adult aged 19 years or older who has certain medical conditions and previously received 1 or more doses of PPSV23 vaccine should receive 1 dose of PCV13. The PCV13 vaccine dose should be obtained 1 or more years after the last PPSV23 vaccine dose.  PNEUMOVAX - Pneumococcal polysaccharide (PPSV23) vaccine. When PCV13 is also indicated, PCV13 should be obtained first. All adults aged 65 years and older should be immunized. An adult younger than age 65 years who has certain medical conditions should be immunized. Any person who resides in a nursing home or long-term care facility should be immunized. An adult smoker should be immunized. People with an immunocompromised condition and certain other conditions should receive both PCV13 and PPSV23 vaccines. People with human immunodeficiency virus (HIV) infection should be immunized as soon as possible after diagnosis. Immunization during chemotherapy or radiation therapy should be avoided. Routine use of PPSV23 vaccine is not recommended for American Indians, [place] Natives, or people younger than 65 years unless there are medical conditions that require PPSV23 vaccine. When indicated, people who have unknown immunization and have no record of immunization should receive PPSV23 vaccine. One-time revaccination 5 years after the first dose of PPSV23 is recommended for people  aged 19-64 years who have chronic kidney failure, nephrotic syndrome, asplenia, or immunocompromised conditions. People who received 1-2 doses of PPSV23 before age 65 years should receive another dose of PPSV23 vaccine at age 65 years or later if at least 5 years have passed since the previous dose. Doses of PPSV23 are not needed for people immunized with PPSV23 at or after age 65 years.  Hepatitis A vaccine.   Adults who wish to be protected from this disease, have certain high-risk conditions, work with hepatitis A-infected animals, work in hepatitis A research labs, or travel to or work in countries with a high rate of hepatitis A should be immunized. Adults who were previously unvaccinated and who anticipate close contact with an international adoptee during the first 60 days after arrival in the [place] from a country with a high rate of hepatitis A should be immunized.  Hepatitis B vaccine. Adults should be immunized if they wish to be protected from this disease, have certain high-risk conditions, may be exposed to blood or other infectious body fluids, are household contacts or sex partners of hepatitis B positive people, are clients or workers in certain care facilities, or travel to or work in countries with a high rate of hepatitis B.  Preventive Service / Frequency  Ages 40 to 64 Blood pressure check. Lipid and cholesterol check Lung cancer screening. / Every year if you are aged 55-80 years and have a 30-pack-year history of smoking and currently smoke or have quit within the past 15 years. Yearly screening is stopped once you have quit smoking for at least 15 years or develop a health problem that would prevent you from having lung cancer treatment. Fecal occult blood test (FOBT) of stool. / Every year beginning at age 50 and continuing until age 75. You may not have to do this test if you get a colonoscopy every 10 years. Flexible sigmoidoscopy** or colonoscopy.** / Every 5 years for  a flexible sigmoidoscopy or every 10 years for a colonoscopy beginning at age 50 and continuing until age 75. Screening for abdominal aortic aneurysm (AAA)  by ultrasound is recommended for people who have history of high blood pressure or who are current or former smokers. +++++++++++ Recommend Adult Low Dose Aspirin or  coated  Aspirin 81 mg daily  To reduce risk of Colon Cancer 40 %,  Skin Cancer 26 % ,  Malignant Melanoma 46%  and  Pancreatic cancer 60% ++++++++++++++++++++ Vitamin D goal  is between 70-100.  Please make sure that you are taking your Vitamin D as directed.  It is very important as a natural anti-inflammatory  helping hair, skin, and nails, as well as reducing stroke and heart attack risk.  It helps your bones and helps with mood. It also decreases numerous cancer risks so please take it as directed.  Low Vit D is associated with a 200-300% higher risk for CANCER  and 200-300% higher risk for HEART   ATTACK  &  STROKE.   ...................................... It is also associated with higher death rate at younger ages,  autoimmune diseases like Rheumatoid arthritis, Lupus, Multiple Sclerosis.    Also many other serious conditions, like depression, Alzheimer's Dementia, infertility, muscle aches, fatigue, fibromyalgia - just to name a few. +++++++++++++++++++++ Recommend the book "The END of DIETING" by Dr Joel Fuhrman  & the book "The END of DIABETES " by Dr Joel Fuhrman At [employer] - get book & Audio CD's    Being diabetic has a  300% increased risk for heart attack, stroke, cancer, and alzheimer- type vascular dementia. It is very important that you work harder with diet by avoiding all foods that are white. Avoid white rice (brown & wild rice is OK), white potatoes (sweetpotatoes in moderation is OK), White bread or wheat bread or anything made out of white flour like bagels, donuts, rolls, buns, biscuits, cakes, pastries, cookies, pizza crust, and pasta (made    from white flour & egg whites) - vegetarian pasta or spinach or wheat pasta is OK. Multigrain breads like Arnold's or Pepper[employer], or multigrain sandwich thins or flatbreads.  Diet, exercise and weight loss can reverse and cure diabetes in the early stages.  Diet, exercise and weight loss is very important in the control and prevention of complications of diabetes which affects every system in your body, ie. Brain - dementia/stroke, eyes - glaucoma/blindness, heart - heart attack/heart failure, kidneys - dialysis, stomach - gastric paralysis, intestines - malabsorption, nerves - severe painful neuritis, circulation - gangrene & loss of a leg(s), and finally cancer and Alzheimers.    I recommend avoid fried & greasy foods,  sweets/candy, white rice (brown or wild rice or Quinoa is OK), white potatoes (sweet potatoes are OK) - anything made from white flour - bagels, doughnuts, rolls, buns, biscuits,white and wheat breads, pizza crust and traditional pasta made of white flour & egg white(vegetarian pasta or spinach or wheat pasta is OK).  Multi-grain bread is OK - like multi-grain flat bread or sandwich thins. Avoid alcohol in excess. Exercise is also important.    Eat all the vegetables you want - avoid meat, especially red meat and dairy - especially cheese.  Cheese is the most concentrated form of trans-fats which is the worst thing to clog up our arteries. Veggie cheese is OK which can be found in the fresh produce section at [employer] or [employer] or [employer]  ++++++++++++++++++++++ DASH Eating Plan  DASH stands for "Dietary Approaches to Stop Hypertension."   The DASH eating plan is a healthy eating plan that has been shown to reduce high blood pressure (hypertension). Additional health benefits may include reducing the risk of type 2 diabetes mellitus, heart disease, and stroke. The DASH eating plan may also help with weight loss. WHAT DO I NEED TO KNOW ABOUT THE DASH EATING PLAN? For  the DASH eating plan, you will follow these general guidelines: Choose foods with a percent daily value for sodium of less than 5% (as listed on the food label). Use salt-free seasonings or herbs instead of table salt or sea salt. Check with your health care provider or pharmacist before using salt substitutes. Eat lower-sodium products, often labeled as "lower sodium" or "no salt added." Eat fresh foods. Eat more vegetables, fruits, and low-fat dairy products. Choose whole grains. Look for the word "whole" as the first word in the ingredient list. Choose fish  Limit sweets, desserts, sugars, and sugary drinks. Choose heart-healthy fats. Eat veggie cheese  Eat more home-cooked food and less restaurant, buffet, and fast food. Limit fried foods. Cook foods using methods other than frying. Limit canned vegetables. If you do use them, rinse them well to decrease the sodium. When eating at a restaurant, ask that your food be prepared with less salt, or no salt if possible.                      WHAT FOODS CAN I EAT? Read Dr Joel Fuhrman's books on The End of Dieting & The End of Diabetes  Grains Whole grain or whole wheat bread. Brown rice. Whole grain or whole wheat pasta. Quinoa, bulgur, and whole grain cereals. Low-sodium cereals. Corn or whole wheat flour tortillas. Whole grain cornbread. Whole grain crackers. Low-sodium crackers.  Vegetables Fresh or frozen vegetables (raw, steamed, roasted, or grilled). Low-sodium or reduced-sodium tomato and vegetable juices. Low-sodium or reduced-sodium tomato sauce and paste. Low-sodium or reduced-sodium canned vegetables.     Fruits All fresh, canned (in natural juice), or frozen fruits.  Protein Products  All fish and seafood.  Dried beans, peas, or lentils. Unsalted nuts and seeds. Unsalted canned beans.  Dairy Low-fat dairy products, such as skim or 1% milk, 2% or reduced-fat cheeses, low-fat ricotta or cottage cheese, or plain low-fat yogurt.  Low-sodium or reduced-sodium cheeses.  Fats and Oils Tub margarines without trans fats. Light or reduced-fat mayonnaise and salad dressings (reduced sodium). Avocado. Safflower, olive, or canola oils. Natural peanut or almond butter.  Other Unsalted popcorn and pretzels. The items listed above may not be a complete list of recommended foods or beverages. Contact your dietitian for more options.  +++++++++++++++++++  WHAT FOODS ARE NOT RECOMMENDED? Grains/ White flour or wheat flour White bread. White pasta. White rice. Refined cornbread. Bagels and croissants. Crackers that contain trans fat.  Vegetables  Creamed or fried vegetables. Vegetables in a . Regular canned vegetables. Regular canned tomato sauce and paste. Regular tomato and vegetable juices.  Fruits Dried fruits. Canned fruit in light or heavy syrup. Fruit juice.  Meat and Other Protein Products Meat in general - RED meat & White meat.  Fatty cuts of meat. Ribs, chicken wings, all processed meats as bacon, sausage, bologna, salami, fatback, hot dogs, bratwurst and packaged luncheon meats.  Dairy Whole or 2% milk, cream, half-and-half, and cream cheese. Whole-fat or sweetened yogurt. Full-fat cheeses or blue cheese. Non-dairy creamers and whipped toppings. Processed cheese, cheese spreads, or cheese curds.  Condiments Onion and garlic salt, seasoned salt, table salt, and sea salt. Canned and packaged gravies. Worcestershire sauce. Tartar sauce. Barbecue sauce. Teriyaki sauce. Soy sauce, including reduced sodium. Steak sauce. Fish sauce. Oyster sauce. Cocktail sauce. Horseradish. Ketchup and mustard. Meat flavorings and tenderizers. Bouillon cubes. Hot sauce. Tabasco sauce. Marinades. Taco seasonings. Relishes.  Fats and Oils Butter, stick margarine, lard, shortening and bacon fat. Coconut, palm kernel, or palm oils. Regular salad dressings.  Pickles and olives. Salted popcorn and pretzels.  The items listed above may not  be a complete list of foods and beverages to avoid.   

## 2022-08-31 NOTE — Progress Notes (Signed)
Annual  Screening/Preventative Visit  & Comprehensive Evaluation & Examination   Future Appointments  Date Time Provider Department  09/01/2022 10:00 AM Lucky Cowboy, MD [facility]  09/10/2023 10:00 AM Lucky Cowboy, MD [facility]            This very nice 52 y.o. DWM presents for a Screening /Preventative Visit & comprehensive evaluation and management of multiple medical co-morbidities.  Patient has been followed for HTN, HLD, T2_NIDDM  and Vitamin D Deficiency.       HTN predates since  1995 . Patient's BP has been controlled at home.  Today's BP is at goal - 118/78 . Patient denies any cardiac symptoms as chest pain, palpitations, shortness of breath, dizziness or ankle swelling.       Patient's hyperlipidemia is controlled with diet and medications. Patient denies myalgias or other medication SE's. Last lipids were at goal except elevated Trig's :  Lab Results  Component Value Date   CHOL 150 05/03/2022   HDL 42 05/03/2022   LDLCALC 78 05/03/2022   TRIG 200 (H) 05/03/2022   CHOLHDL 3.6 05/03/2022         Patient has  has hx/o  Morbid Obesity (BMI  37.21) and T2_NIDDM  (A1c 8.4% /2018) . Patient was started on Novolin 70/30 bid in Sept 2020, but relates that he has since tapered off of his Insulin. .    Patient denies reactive hypoglycemic symptoms, visual blurring, diabetic polys or paresthesias. Last A1c was   Lab Results  Component Value Date   HGBA1C 6.0 (H) 05/03/2022           In 2018, patient had mild Hypothyroidism with elevated TSH which subsequently recovered  suggesting  transient Thyroiditis.        Finally, patient has history of Vitamin D Deficiency ("21"/2018) and last vitamin D was still very low  (goal 70-100) :   Lab Results  Component Value Date   VD25OH 31 05/03/2022     Current Outpatient Medications on File Prior to Visit  Medication Sig   aspirin 81 MG tablet daily.   bisoprolol-hctz10-6.25 MG tablet Take 1 tablet daily    insulin NPH-regular Human (70-30) Inject 40 units with breakfast, 35 units with dinner.   metFORMIN -XR 500 MG 24 hr tablet Take  2 tablets 2 x/ day with Meals    olmesartan  40 MG tablet Take 1/2 to 1 tablet at night    rosuvastatin  40 MG tablet Take  1 tablet   5 x  /week                     Semaglutide, 2 MG/DOSE, (OZEMPIC, 2 MG/DOSE,) 8 MG/3ML Inject  2 mg into skin  every 7 days for Diabetes ( e11.29 )   TURMERIC Take  daily.    Past Medical History:  Diagnosis Date   Diabetes mellitus without complication (HCC)    Hyperlipidemia    Hypertension    Morbid obesity (HCC)    Plantar fasciitis    Prediabetes    Vitamin D deficiency      Health Maintenance  Topic Date Due   HIV Screening  Never done   Hepatitis C Screening  Never done   Zoster Vaccines- Shingrix (1 of 2) Never done   COVID-19 Vaccine (2 - Pfizer risk series) 12/10/2019   OPHTHALMOLOGY EXAM  12/12/2019   FOOT EXAM  03/23/2021   Diabetic kidney evaluation - Urine ACR  03/24/2021  DTaP/Tdap/Td (2 - Td or Tdap) 03/13/2022   INFLUENZA VACCINE  10/12/2022   HEMOGLOBIN A1C  11/01/2022   Colonoscopy  02/02/2023   Diabetic kidney evaluation - eGFR measurement  05/04/2023   HPV VACCINES  Aged Out     Immunization History  Administered Date(s) Administered   Influenza Inj Mdck Quad With Preservative 02/06/2018   Influenza,inj,Quad PF,6+ Mos 01/18/2022   PFIZER(Purple Top)SARS-COV-2 Vaccination 11/19/2019   PPD Test 02/06/2018, 02/25/2019, 03/24/2020   Pneumococcal Polysaccharide-23 01/16/2019   Tdap 03/13/2012    Last Colon -  02/02/2020 - Dr Kirtland Bouchard. Lavon Paganini - Recc 3 year f/u due Nov2024  Past Surgical History:  Procedure Laterality Date   ROOT CANAL  2021     Family History  Problem Relation Age of Onset   Hypertension Mother    Hyperlipidemia Mother    Lung cancer Mother    Heart disease Father    Hyperlipidemia Father    Hypertension Father    Early death Father    Diabetes Father    COPD  Father    Colon cancer Neg Hx    Colon polyps Neg Hx    Esophageal cancer Neg Hx    Stomach cancer Neg Hx    Rectal cancer Neg Hx      Social History   Tobacco Use   Smoking status: Former    Types: Cigarettes    Quit date: 05/13/2011    Years since quitting: 11.3   Smokeless tobacco: Never  Vaping Use   Vaping Use: Never used  Substance Use Topics   Alcohol use: Not Currently   Drug use: Not Currently    Types: Marijuana      ROS Constitutional: Denies fever, chills, weight loss/gain, headaches, insomnia,  night sweats or change in appetite. Does c/o fatigue. Eyes: Denies redness, blurred vision, diplopia, discharge, itchy or watery eyes.  ENT: Denies discharge, congestion, post nasal drip, epistaxis, sore throat, earache, hearing loss, dental pain, Tinnitus, Vertigo, Sinus pain or snoring.  Cardio: Denies chest pain, palpitations, irregular heartbeat, syncope, dyspnea, diaphoresis, orthopnea, PND, claudication or edema Respiratory: denies cough, dyspnea, DOE, pleurisy, hoarseness, laryngitis or wheezing.  Gastrointestinal: Denies dysphagia, heartburn, reflux, water brash, pain, cramps, nausea, vomiting, bloating, diarrhea, constipation, hematemesis, melena, hematochezia, jaundice or hemorrhoids Genitourinary: Denies dysuria, frequency, urgency, nocturia, hesitancy, discharge, hematuria or flank pain Musculoskeletal: Denies arthralgia, myalgia, stiffness, Jt. Swelling, pain, limp or strain/sprain. Denies Falls. Skin: Denies puritis, rash, hives, warts, acne, eczema or change in skin lesion Neuro: No weakness, tremor, incoordination, spasms, paresthesia or pain Psychiatric: Denies confusion, memory loss or sensory loss. Denies Depression. Endocrine: Denies change in weight, skin, hair change, nocturia, and paresthesia, diabetic polys, visual blurring or hyper / hypo glycemic episodes.  Heme/Lymph: No excessive bleeding, bruising or enlarged lymph nodes.   Physical Exam  BP  118/78   Pulse 92   Temp 97.9 F (36.6 C)   Resp 16   Ht 6\' 1"  (1.854 m)   Wt 258 lb (117 kg)   SpO2 95%   BMI 34.04 kg/m   General Appearance: Well nourished and well groomed and in no apparent distress.  Eyes: PERRLA, EOMs, conjunctiva no swelling or erythema, normal fundi and vessels. Sinuses: No frontal/maxillary tenderness ENT/Mouth: EACs patent / TMs  nl. Nares clear without erythema, swelling, mucoid exudates. Oral hygiene is good. No erythema, swelling, or exudate. Tongue normal, non-obstructing. Tonsils not swollen or erythematous. Hearing normal.  Neck: Supple, thyroid not palpable. No bruits, nodes or JVD. Respiratory: Respiratory  effort normal.  BS equal and clear bilateral without rales, rhonci, wheezing or stridor. Cardio: Heart sounds are normal with regular rate and rhythm and no murmurs, rubs or gallops. Peripheral pulses are normal and equal bilaterally without edema. No aortic or femoral bruits. Chest: symmetric with normal excursions and percussion.  Abdomen: Soft, with Nl bowel sounds. Nontender, no guarding, rebound, hernias, masses, or organomegaly.  Lymphatics: Non tender without lymphadenopathy.  Musculoskeletal: Full ROM all peripheral extremities, joint stability, 5/5 strength, and normal gait. Skin: Warm and dry without rashes, lesions, cyanosis, clubbing or  ecchymosis.  Neuro: Cranial nerves intact, reflexes equal bilaterally. Normal muscle tone, no cerebellar symptoms. Sensation intact.  Pysch: Alert and oriented X 3 with normal affect, insight and judgment appropriate.   Assessment and Plan  1. Annual Preventative/Screening Exam    2. Essential hypertension  - EKG 12-Lead - [place], RETROPERITNL ABD,  LTD - Urinalysis, Routine w reflex microscopic - Microalbumin / creatinine urine ratio - CBC with Differential/Platelet - COMPLETE METABOLIC PANEL WITH GFR - Magnesium - TSH  3. Hyperlipidemia associated with type 2 diabetes mellitus (HCC)  - EKG  12-Lead - [place], RETROPERITNL ABD,  LTD - Lipid panel - TSH  4. 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 Vitamin D deficiency  - VITAMIN D 25 Hydroxy   6. Class 2 severe obesity due to excess calories with serious comorbidity                                               and body mass index (BMI) of 37.0 to 37.9 in adult (HCC)  - TSH  7. History of thyroiditis  - TSH  8. Screening for heart disease  - EKG 12-Lead  9. FHx: heart disease  - EKG 12-Lead - [place], RETROPERITNL ABD,  LTD  10. Screening for AAA (aortic abdominal aneurysm)  - [place], RETROPERITNL ABD,  LTD  11. B12 deficiency   12. Fatigue  - Iron, Total/Total Iron Binding Cap - Vitamin B12 - Testosterone  13. Medication management  - Urinalysis, Routine w reflex microscopic - Microalbumin / creatinine urine ratio - CBC with Differential/Platelet - COMPLETE METABOLIC PANEL WITH GFR - Magnesium - Lipid panel - TSH - Hemoglobin A1c - VITAMIN D 25 Hydroxy   14. Screening for colorectal cancer  - POC Hemoccult Bld/Stl   15. Prostate cancer screening  - PSA  16. Screening-pulmonary TB  - TB Skin Test           Patient was counseled in prudent diet, weight control to achieve/maintain BMI less than 25, BP monitoring, regular exercise and medications as discussed.  Discussed med effects and SE's. Routine screening labs and tests as requested with regular follow-up as recommended. Over 40 minutes of exam, counseling, chart review and high complex critical decision making was  performed   Kirtland Bouchard, MD

## 2022-09-01 ENCOUNTER — Encounter: Payer: Self-pay | Admitting: Internal Medicine

## 2022-09-01 ENCOUNTER — Ambulatory Visit (INDEPENDENT_AMBULATORY_CARE_PROVIDER_SITE_OTHER): Payer: Managed Care, Other (non HMO) | Admitting: Internal Medicine

## 2022-09-01 VITALS — BP 118/78 | HR 92 | Temp 97.9°F | Resp 16 | Ht 73.0 in | Wt 258.0 lb

## 2022-09-01 DIAGNOSIS — Z1389 Encounter for screening for other disorder: Secondary | ICD-10-CM | POA: Diagnosis not present

## 2022-09-01 DIAGNOSIS — N401 Enlarged prostate with lower urinary tract symptoms: Secondary | ICD-10-CM | POA: Diagnosis not present

## 2022-09-01 DIAGNOSIS — Z8639 Personal history of other endocrine, nutritional and metabolic disease: Secondary | ICD-10-CM

## 2022-09-01 DIAGNOSIS — Z0000 Encounter for general adult medical examination without abnormal findings: Secondary | ICD-10-CM | POA: Diagnosis not present

## 2022-09-01 DIAGNOSIS — Z79899 Other long term (current) drug therapy: Secondary | ICD-10-CM

## 2022-09-01 DIAGNOSIS — Z13220 Encounter for screening for lipoid disorders: Secondary | ICD-10-CM | POA: Diagnosis not present

## 2022-09-01 DIAGNOSIS — Z125 Encounter for screening for malignant neoplasm of prostate: Secondary | ICD-10-CM | POA: Diagnosis not present

## 2022-09-01 DIAGNOSIS — I700 Atherosclerosis of aorta: Secondary | ICD-10-CM | POA: Diagnosis not present

## 2022-09-01 DIAGNOSIS — E1122 Type 2 diabetes mellitus with diabetic chronic kidney disease: Secondary | ICD-10-CM

## 2022-09-01 DIAGNOSIS — Z8249 Family history of ischemic heart disease and other diseases of the circulatory system: Secondary | ICD-10-CM

## 2022-09-01 DIAGNOSIS — Z1329 Encounter for screening for other suspected endocrine disorder: Secondary | ICD-10-CM | POA: Diagnosis not present

## 2022-09-01 DIAGNOSIS — R350 Frequency of micturition: Secondary | ICD-10-CM | POA: Diagnosis not present

## 2022-09-01 DIAGNOSIS — Z130 Encounter for screening for diseases of the blood and blood-forming organs and certain disorders involving the immune mechanism: Secondary | ICD-10-CM

## 2022-09-01 DIAGNOSIS — Z111 Encounter for screening for respiratory tuberculosis: Secondary | ICD-10-CM

## 2022-09-01 DIAGNOSIS — E538 Deficiency of other specified B group vitamins: Secondary | ICD-10-CM

## 2022-09-01 DIAGNOSIS — Z131 Encounter for screening for diabetes mellitus: Secondary | ICD-10-CM | POA: Diagnosis not present

## 2022-09-01 DIAGNOSIS — I10 Essential (primary) hypertension: Secondary | ICD-10-CM

## 2022-09-01 DIAGNOSIS — E559 Vitamin D deficiency, unspecified: Secondary | ICD-10-CM

## 2022-09-01 DIAGNOSIS — Z136 Encounter for screening for cardiovascular disorders: Secondary | ICD-10-CM

## 2022-09-01 DIAGNOSIS — R5383 Other fatigue: Secondary | ICD-10-CM

## 2022-09-01 DIAGNOSIS — E1169 Type 2 diabetes mellitus with other specified complication: Secondary | ICD-10-CM

## 2022-09-01 DIAGNOSIS — Z1211 Encounter for screening for malignant neoplasm of colon: Secondary | ICD-10-CM

## 2022-09-01 DIAGNOSIS — Z0001 Encounter for general adult medical examination with abnormal findings: Secondary | ICD-10-CM

## 2022-09-01 LAB — COMPLETE METABOLIC PANEL WITH GFR
CO2: 26 mmol/L (ref 20–32)
Calcium: 9.6 mg/dL (ref 8.6–10.3)
Glucose, Bld: 120 mg/dL — ABNORMAL HIGH (ref 65–99)

## 2022-09-01 LAB — MAGNESIUM: Magnesium: 1.7 mg/dL (ref 1.5–2.5)

## 2022-09-01 LAB — CBC WITH DIFFERENTIAL/PLATELET
Absolute Monocytes: 783 cells/uL (ref 200–950)
Eosinophils Absolute: 299 cells/uL (ref 15–500)
Eosinophils Relative: 2.9 %
Hemoglobin: 13.4 g/dL (ref 13.2–17.1)
Lymphs Abs: 1967 cells/uL (ref 850–3900)
MCHC: 32.7 g/dL (ref 32.0–36.0)
MCV: 88.4 fL (ref 80.0–100.0)
Neutro Abs: 7200 cells/uL (ref 1500–7800)

## 2022-09-01 LAB — IRON, TOTAL/TOTAL IRON BINDING CAP
Iron: 59 ug/dL (ref 50–180)
TIBC: 330 mcg/dL (calc) (ref 250–425)

## 2022-09-02 LAB — LIPID PANEL
Cholesterol: 167 mg/dL (ref <200)
HDL: 48 mg/dL (ref >=40)
LDL Cholesterol (Calc): 89 mg/dL (calc)
Non-HDL Cholesterol (Calc): 119 mg/dL (calc) (ref <130)
Total CHOL/HDL Ratio: 3.5 (calc) (ref <5.0)
Triglycerides: 208 mg/dL — ABNORMAL HIGH (ref <150)

## 2022-09-02 LAB — VITAMIN B12: Vitamin B-12: 319 pg/mL (ref 200–1100)

## 2022-09-02 LAB — CBC WITH DIFFERENTIAL/PLATELET
Basophils Absolute: 52 cells/uL (ref 0–200)
Basophils Relative: 0.5 %
HCT: 41 % (ref 38.5–50.0)
MCH: 28.9 pg (ref 27.0–33.0)
MPV: 10.7 fL (ref 7.5–12.5)
Monocytes Relative: 7.6 %
Neutrophils Relative %: 69.9 %
Platelets: 268 10*3/uL (ref 140–400)
RBC: 4.64 10*6/uL (ref 4.20–5.80)
RDW: 14.9 % (ref 11.0–15.0)
Total Lymphocyte: 19.1 %
WBC: 10.3 10*3/uL (ref 3.8–10.8)

## 2022-09-02 LAB — COMPLETE METABOLIC PANEL WITH GFR
AG Ratio: 1.4 (calc) (ref 1.0–2.5)
ALT: 21 U/L (ref 9–46)
AST: 14 U/L (ref 10–35)
Albumin: 4.2 g/dL (ref 3.6–5.1)
Alkaline phosphatase (APISO): 89 U/L (ref 35–144)
BUN: 15 mg/dL (ref 7–25)
Chloride: 105 mmol/L (ref 98–110)
Creat: 1.01 mg/dL (ref 0.70–1.30)
Globulin: 2.9 g/dL (calc) (ref 1.9–3.7)
Potassium: 4.5 mmol/L (ref 3.5–5.3)
Sodium: 139 mmol/L (ref 135–146)
Total Bilirubin: 0.5 mg/dL (ref 0.2–1.2)
Total Protein: 7.1 g/dL (ref 6.1–8.1)
eGFR: 89 mL/min/{1.73_m2} (ref >=60)

## 2022-09-02 LAB — PSA: PSA: 0.65 ng/mL (ref <=4.00)

## 2022-09-02 LAB — HEMOGLOBIN A1C
Hgb A1c MFr Bld: 6.1 % of total Hgb — ABNORMAL HIGH (ref <5.7)
Mean Plasma Glucose: 128 mg/dL
eAG (mmol/L): 7.1 mmol/L

## 2022-09-02 LAB — VITAMIN D 25 HYDROXY (VIT D DEFICIENCY, FRACTURES): Vit D, 25-Hydroxy: 33 ng/mL (ref 30–100)

## 2022-09-02 LAB — TESTOSTERONE: Testosterone: 273 ng/dL (ref 250–827)

## 2022-09-02 LAB — IRON, TOTAL/TOTAL IRON BINDING CAP: %SAT: 18 % (calc) — ABNORMAL LOW (ref 20–48)

## 2022-09-02 LAB — TSH: TSH: 2 mIU/L (ref 0.40–4.50)

## 2022-09-02 NOTE — Progress Notes (Signed)
^<^<^<^<^<^<^<^<^<^<^<^<^<^<^<^<^<^<^<^<^<^<^<^<^<^<^<^<^<^<^<^<^<^<^<^<^ ^>^>^>^>^>^>^>^>^>^>^>>^>^>^>^>^>^>^>^>^>^>^>^>^>^>^>^>^>^>^>^>^>^>^>^>^>  -Test results slightly outside the reference range are not unusual. If there is anything important, I will review this with you,  otherwise it is considered normal test values.  If you have further questions,  please do not hesitate to contact me at the office or via My Chart.   ^<^<^<^<^<^<^<^<^<^<^<^<^<^<^<^<^<^<^<^<^<^<^<^<^<^<^<^<^<^<^<^<^<^<^<^<^ ^>^>^>^>^>^>^>^>^>^>^>^>^>^>^>^>^>^>^>^>^>^>^>^>^>^>^>^>^>^>^>^>^>^>^>^>^  -  Iron level is OK ^<^<^<^<^<^<^<^<^<^<^<^<^<^<^<^<^<^<^<^<^<^<^<^<^<^<^<^<^<^<^<^<^<^<^<^<^ ^>^>^>^>^>^>^>^>^>^>^>^>^>^>^>^>^>^>^>^>^>^>^>^>^>^>^>^>^>^>^>^>^>^>^>^>^  -  Vitamin B12 = 319   is     Very, Very Low  (Ideal or Goal Vit B12 is between 450 - 1,100)   Low Vit B12 may be associated with Anemia , Fatigue,   Peripheral Neuropathy, Dementia, "Brain Fog", & Depression  - Recommend take a sub-lingual form of Vitamin B12 tablet   1,000 to 5,000 mcg tab that you dissolve under your tongue /Daily   - Can get Lavonia Dana - best price at [employer] or on [employer]  ^<^<^<^<^<^<^<^<^<^<^<^<^<^<^<^<^<^<^<^<^<^<^<^<^<^<^<^<^<^<^<^<^<^<^<^<^ ^>^>^>^>^>^>^>^>^>^>^>^>^>^>^>^>^>^>^>^>^>^>^>^>^>^>^>^>^>^>^>^>^>^>^>^>^  -  Chol = 167 - Great   ^<^<^<^<^<^<^<^<^<^<^<^<^<^<^<^<^<^<^<^<^<^<^<^<^<^<^<^<^<^<^<^<^<^<^<^<^ ^>^>^>^>^>^>^>^>^>^>^>^>^>^>^>^>^>^>^>^>^>^>^>^>^>^>^>^>^>^>^>^>^>^>^>^>^  -  A1c = 6.1%   &  average glucose 128 mg%                                                 - Keep up the good work - much better than previous ^<^<^<^<^<^<^<^<^<^<^<^<^<^<^<^<^<^<^<^<^<^<^<^<^<^<^<^<^<^<^<^<^<^<^<^<^ ^>^>^>^>^>^>^>^>^>^>^>^>^>^>^>^>^>^>^>^>^>^>^>^>^>^>^>^>^>^>^>^>^>^>^>^>^  -  PSA - very low - No Prostate cancer  - Great   !  ^<^<^<^<^<^<^<^<^<^<^<^<^<^<^<^<^<^<^<^<^<^<^<^<^<^<^<^<^<^<^<^<^<^<^<^<^ ^>^>^>^>^>^>^>^>^>^>^>^>^>^>^>^>^>^>^>^>^>^>^>^>^>^>^>^>^>^>^>^>^>^>^>^>^  -  Testosterone   in low normal   - Recc Exercise & take Zinc 50 mg tab daily to help raise Testosterone naturally   ^<^<^<^<^<^<^<^<^<^<^<^<^<^<^<^<^<^<^<^<^<^<^<^<^<^<^<^<^<^<^<^<^<^<^<^<^ ^>^>^>^>^>^>^>^>^>^>^>^>^>^>^>^>^>^>^>^>^>^>^>^>^>^>^>^>^>^>^>^>^>^>^>^>^  -   Magnesium  = 1.7 is STILL Very,  Very  low  - goal is betw 2.0 - 2.5,   - So..............Marland Kitchen  Recommend that you take                                              Magnesium 500 mg tablet 3 x /day with Meals    - also important to eat lots of  leafy green vegetables   - broccoli - quinoa - squash - almonds   - black, red, white beans  -  peas - green beans  ^<^<^<^<^<^<^<^<^<^<^<^<^<^<^<^<^<^<^<^<^<^<^<^<^<^<^<^<^<^<^<^<^<^<^<^<^ ^>^>^>^>^>^>^>^>^>^>^>^>^>^>^>^>^>^>^>^>^>^>^>^>^>^>^>^>^>^>^>^>^>^>^>^>^  -  Vitamin D= 33 - is Extremely & Dangerously   Low   - Vitamin D goal is between 70-100.   - Please take Vitamin D 5,000 unit caps  x 2 caps = 10,000 units / day   - It is very important as a natural anti-inflammatory and helping the                           immune system protect against viral infections, like the Covid-19    helping hair, skin, and nails, as well as reducing stroke and heart attack risk.   - It helps your bones and helps with mood.  - It also decreases numerous cancer risks so please  take it as directed.   - Low Vit D is associated with a 200-300% higher risk for CANCER   and 200-300% higher risk for HEART   ATTACK  &  STROKE.    - It is also associated with higher death rate at younger ages,   autoimmune diseases like Rheumatoid arthritis, Lupus, Multiple Sclerosis.     - Also many other serious conditions, like depression,  Alzheimer's  Dementia,  muscle aches, fatigue, fibromyalgia   ^<^<^<^<^<^<^<^<^<^<^<^<^<^<^<^<^<^<^<^<^<^<^<^<^<^<^<^<^<^<^<^<^<^<^<^<^ ^>^>^>^>^>^>^>^>^>^>^>^>^>^>^>^>^>^>^>^>^>^>^>^>^>^>^>^>^>^>^>^>^>^>^>^>^  -  All Else - CBC - Kidneys - Electrolytes - Liver - Magnesium & Thyroid    - all  Normal / OK  ^<^<^<^<^<^<^<^<^<^<^<^<^<^<^<^<^<^<^<^<^<^<^<^<^<^<^<^<^<^<^<^<^<^<^<^<^ ^>^>^>^>^>^>^>^>^>^>^>^>^>^>^>^>^>^>^>^>^>^>^>^>^>^>^>^>^>^>^>^>^>^>^>^>^

## 2022-11-01 ENCOUNTER — Other Ambulatory Visit: Payer: Self-pay | Admitting: Internal Medicine

## 2022-11-01 DIAGNOSIS — E782 Mixed hyperlipidemia: Secondary | ICD-10-CM

## 2022-11-07 ENCOUNTER — Ambulatory Visit (INDEPENDENT_AMBULATORY_CARE_PROVIDER_SITE_OTHER): Payer: Managed Care, Other (non HMO) | Admitting: Internal Medicine

## 2022-11-07 ENCOUNTER — Encounter: Payer: Self-pay | Admitting: Internal Medicine

## 2022-11-07 VITALS — BP 150/80 | HR 76 | Temp 97.4°F | Resp 16 | Ht 73.0 in | Wt 260.0 lb

## 2022-11-07 DIAGNOSIS — R451 Restlessness and agitation: Secondary | ICD-10-CM | POA: Diagnosis not present

## 2022-11-07 DIAGNOSIS — F419 Anxiety disorder, unspecified: Secondary | ICD-10-CM

## 2022-11-07 NOTE — Progress Notes (Signed)
     Future Appointments  Date Time Provider Department  11/07/2022  4:00 PM Lucky Cowboy, MD [facility]  12/18/2022  9:30 AM Adela Glimpse, NP [facility]  03/26/2023  9:30 AM Lucky Cowboy, MD [facility]  06/25/2023  9:30 AM Adela Glimpse, NP [facility]  09/24/2023 10:00 AM Lucky Cowboy, MD [facility]    History of Present Illness:       This very nice 52 y.o. DWM with  HTN, HLD, T2_NIDDM  and Vitamin D Deficiency presents with c/o of  increased anxiety, irritability & moodiness. He relate that several friends and close relatives have advised him to seek counseling. He has no recognized triggers for his moodiness. He denies any SI/HI.  Denies use of any illicit drugs or alcohol. He is receptive to taking meds to help with his sx's.    Current Outpatient Medications on File Prior to Visit  Medication Sig   aspirin 81 MG tablet daily.   bisoprolol-hctz 10-6.25 MG tablet Take 1 tablet daily for blood pressure   Novolin  (70-30) 100 UNIT/ML injec Inject 40 unit-breakfast, 35 units-dinner.   metFORMIN -XR  500 MG  Take  2 tablets 2 x/ day with Meals    rosuvastatin 40 MG tablet Take  1 tablet 5 x /week   Semaglutide, 2 MG/DOSE /  8 MG/3ML  Inject  2 mg into skin  every 7 days for Diabetes    TURMERIC  Take  daily.     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   Observations/Objective:  BP (!) 150/80   Pulse 76   Temp (!) 97.4 F (36.3 C)   Resp 16   Ht 6\' 1"  (1.854 m)   Wt 260 lb (117.9 kg)   SpO2 97%   BMI 34.30 kg/m   HEENT - WNL. Neck - supple.  Chest - Clear equal BS. Cor - Nl HS. RRR w/o sig MGR. PP 1(+). No edema. MS- FROM w/o deformities.  Gait Nl. Neuro -  Nl w/o focal  abnormalities.   Assessment and Plan:   1. Anxiety with agitation  - escitalopram (LEXAPRO) 20 MG tablet;  Take 1 tablet Daily for Mood   Dispense: 90 tablet; Refill: 3  - Given name of Psychologist - Dr Beverly Sessions to reach out to for counseling   Follow Up Instructions:     has 1 month f/u         I discussed the assessment and treatment plan with the patient. The patient was provided an opportunity to ask questions and all were answered. The patient agreed with the plan and demonstrated an understanding of the instructions.       The patient was advised to call back or seek an in-person evaluation if the symptoms worsen or if the condition fails to improve as anticipated.    Charles Maw, MD

## 2022-11-12 ENCOUNTER — Encounter: Payer: Self-pay | Admitting: Internal Medicine

## 2022-11-12 MED ORDER — ESCITALOPRAM OXALATE 20 MG PO TABS
ORAL_TABLET | ORAL | 3 refills | Status: DC
Start: 2022-11-12 — End: 2023-09-03

## 2022-11-12 NOTE — Patient Instructions (Signed)

## 2022-12-18 ENCOUNTER — Ambulatory Visit: Payer: Managed Care, Other (non HMO) | Admitting: Nurse Practitioner

## 2022-12-18 NOTE — Progress Notes (Deleted)
FOLLOW UP  Assessment and Plan:   Hypertension Continue asa, bisoprolol-HCTZ Discontinue Olmesartan  Well controlled with current medications  Monitor blood pressure at home; patient to call if consistently greater than 130/80 Continue DASH diet.   Reminder to go to the ER if any CP, SOB, nausea, dizziness, severe HA, changes vision/speech, left arm numbness and tingling and jaw pain.  Fatty Liver Monitor LFTs Limit Tylenol and Alcohol use  Hyperlipidemia associated with DM2 Continue Rosuvastatin Discussed lifestyle modifications. Recommended diet heavy in fruits and veggies, omega 3's. Decrease consumption of animal meats, cheeses, and dairy products. Remain active and exercise as tolerated. Continue to monitor. Check lipids/TSH  Diabetes with CKD Continue Semaglutide, Metformin Discontinue NPH insulin Education: Reviewed 'ABCs' of diabetes management  Discussed goals to be met and/or maintained include A1C (<7) Blood pressure (<130/80) Cholesterol (LDL <70) Continue Eye Exam yearly  Continue Dental Exam Q6 mo Discussed dietary recommendations Discussed Physical Activity recommendations Foot exam UTD Check A1C  CKD stage 1 w/DM Discussed how what you eat and drink can aide in kidney protection. Stay well hydrated. Avoid high salt foods. Avoid NSAIDS. Keep BP and BG well controlled.   Take medications as prescribed. Remain active and exercise as tolerated daily. Maintain weight.  Continue to monitor. Check CMP/GFR/Microablumin  Vitamin D Deficiency Below goal of 60-100 last check 01/2022 Continue supplement Monitor levels  Obesity with co morbidities Improving Discussed appropriate BMI Diet modification. Physical activity. Encouraged/praised to build confidence.  Medication management All medications discussed and reviewed in full. All questions and concerns regarding medications addressed.    No orders of the defined types were placed in this  encounter.  Notify office for further evaluation and treatment, questions or concerns if any reported s/s fail to improve.   The patient was advised to call back or seek an in-person evaluation if any symptoms worsen or if the condition fails to improve as anticipated.   Further disposition pending results of labs. Discussed med's effects and SE's.    I discussed the assessment and treatment plan with the patient. The patient was provided an opportunity to ask questions and all were answered. The patient agreed with the plan and demonstrated an understanding of the instructions.  Discussed med's effects and SE's. Screening labs and tests as requested with regular follow-up as recommended.  I provided 25 minutes of face-to-face time during this encounter including counseling, chart review, and critical decision making was preformed.   Future Appointments  Date Time Provider Department Center  12/18/2022  9:30 AM Adela Glimpse, NP [facility] None  03/26/2023  9:30 AM Lucky Cowboy, MD [facility] None  06/25/2023  9:30 AM Adela Glimpse, NP [facility] None  09/24/2023 10:00 AM Lucky Cowboy, MD [facility] None    ----------------------------------------------------------------------------------------------------------------------  HPI 51 y.o. male  presents for 3 month follow up on hypertension, cholesterol, diabetes, weight and vitamin D deficiency.   Overall he reports feeling well today.  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    BMI is There is no height or weight on file to calculate BMI., he has been working on diet and exercise. Wt Readings from Last 3 Encounters:  11/07/22 260 lb (117.9 kg)  09/01/22 258 lb (117 kg)  05/03/22 265 lb (120.2 kg)    His blood pressure has been controlled at home, today their BP is   He has not been taking Olmesartan. Has been out of medication for several week.  He has continued  Bisoprolol-HCTZ with asa.   He does not workout. He denies chest pain, shortness of breath, dizziness.   He is on cholesterol medication Rosuvastatin and denies myalgias. His cholesterol is at goal. The cholesterol last visit was:   Lab Results  Component Value Date   CHOL 167 09/01/2022   HDL 48 09/01/2022   LDLCALC 89 09/01/2022   TRIG 208 (H) 09/01/2022   CHOLHDL 3.5 09/01/2022    He has been working on diet and exercise for prediabetes, and denies polydipsia and polyuria. Continues on Ozeempic 1 mg, Metformin, and NPH.  Has been taking NPH insulin on PRN.  Sugars have been averaging <100 without inuslin.  Last A1C in the office was:  Lab Results  Component Value Date   HGBA1C 6.1 (H) 09/01/2022   Patient is on Vitamin D supplement.   Lab Results  Component Value Date   VD25OH 33 09/01/2022     CKD is stable.  Last GFR:   Lab Results  Component Value Date   EGFR 89 09/01/2022   EGFR 104 05/03/2022   EGFR 98 01/18/2022      Current Medications:  Current Outpatient Medications on File Prior to Visit  Medication Sig   Accu-Chek Softclix Lancets lancets Use as instructed   aspirin 81 MG chewable tablet Chew by mouth daily.   bisoprolol-hydrochlorothiazide (ZIAC) 10-6.25 MG tablet Take 1 tablet by mouth once daily for blood pressure   Blood Glucose Monitoring Suppl DEVI Check glucose 4 times a day and PRN.   escitalopram (LEXAPRO) 20 MG tablet Take 1 tablet Daily for Mood   glucose blood test strip Check Blood Sugar 4 x /day  ->  before meals & bedtime   insulin NPH-regular Human (70-30) 100 UNIT/ML injection Inject 40 units with breakfast, 35 units with dinner. (Patient not taking: Reported on 11/07/2022)   metFORMIN (GLUCOPHAGE-XR) 500 MG 24 hr tablet Take  2 tablets 2 x/ day with Meals for Diabetes                                              /                                                    TAKE                                                   BY                                                     MOUTH   rosuvastatin (CRESTOR) 40 MG tablet Take  1 tablet 5 x /week for Cholesterol                                                                             /  TAKE                                         BY                                                 MOUTH   Semaglutide, 2 MG/DOSE, (OZEMPIC, 2 MG/DOSE,) 8 MG/3ML SOPN Inject  2 mg into skin  every 7 days for Diabetes ( e11.29 )   TURMERIC PO Take by mouth daily.   No current facility-administered medications on file prior to visit.     Allergies: No Known Allergies   Medical History:  Past Medical History:  Diagnosis Date   Diabetes mellitus without complication (HCC)    Hyperlipidemia    Hypertension    Morbid obesity (HCC)    Plantar fasciitis    Prediabetes    Vitamin D deficiency    Family history- Reviewed and unchanged Social history- Reviewed and unchanged   Review of Systems:  ROS    Physical Exam: There were no vitals taken for this visit. Wt Readings from Last 3 Encounters:  11/07/22 260 lb (117.9 kg)  09/01/22 258 lb (117 kg)  05/03/22 265 lb (120.2 kg)   General Appearance: Well nourished, in no apparent distress. Eyes: PERRLA, EOMs, conjunctiva no swelling or erythema Sinuses: No Frontal/maxillary tenderness ENT/Mouth: Ext aud canals clear, TMs without erythema, bulging. No erythema, swelling, or exudate on post pharynx.  Tonsils not swollen or erythematous. Hearing normal.  Neck: Supple, thyroid normal.  Respiratory: Respiratory effort normal, BS equal bilaterally without rales, rhonchi, wheezing or stridor.  Cardio: RRR with no MRGs. Brisk peripheral pulses without edema.  Abdomen: Soft, + BS.  Non tender, no guarding, rebound, hernias, masses. Lymphatics: Non tender without lymphadenopathy.  Musculoskeletal: Full ROM, 5/5 strength, Normal gait Skin: Warm, dry without rashes, lesions, ecchymosis.  Neuro:  Cranial nerves intact. No cerebellar symptoms.  Psych: Awake and oriented X 3, normal affect, Insight and Judgment appropriate.    Adela Glimpse, NP 8:59 AM [facility]

## 2022-12-25 ENCOUNTER — Encounter: Payer: Self-pay | Admitting: Nurse Practitioner

## 2022-12-25 ENCOUNTER — Ambulatory Visit (INDEPENDENT_AMBULATORY_CARE_PROVIDER_SITE_OTHER): Payer: Managed Care, Other (non HMO) | Admitting: Nurse Practitioner

## 2022-12-25 VITALS — BP 118/78 | HR 84 | Temp 97.5°F | Ht 73.0 in | Wt 256.6 lb

## 2022-12-25 DIAGNOSIS — E785 Hyperlipidemia, unspecified: Secondary | ICD-10-CM

## 2022-12-25 DIAGNOSIS — E1169 Type 2 diabetes mellitus with other specified complication: Secondary | ICD-10-CM

## 2022-12-25 DIAGNOSIS — I10 Essential (primary) hypertension: Secondary | ICD-10-CM

## 2022-12-25 DIAGNOSIS — Z79899 Other long term (current) drug therapy: Secondary | ICD-10-CM

## 2022-12-25 DIAGNOSIS — E559 Vitamin D deficiency, unspecified: Secondary | ICD-10-CM | POA: Diagnosis not present

## 2022-12-25 DIAGNOSIS — Z23 Encounter for immunization: Secondary | ICD-10-CM | POA: Diagnosis not present

## 2022-12-25 DIAGNOSIS — K760 Fatty (change of) liver, not elsewhere classified: Secondary | ICD-10-CM

## 2022-12-25 DIAGNOSIS — E1122 Type 2 diabetes mellitus with diabetic chronic kidney disease: Secondary | ICD-10-CM

## 2022-12-25 DIAGNOSIS — R451 Restlessness and agitation: Secondary | ICD-10-CM

## 2022-12-25 DIAGNOSIS — N181 Chronic kidney disease, stage 1: Secondary | ICD-10-CM

## 2022-12-25 DIAGNOSIS — F419 Anxiety disorder, unspecified: Secondary | ICD-10-CM

## 2022-12-25 NOTE — Patient Instructions (Signed)

## 2022-12-25 NOTE — Progress Notes (Signed)
FOLLOW UP  Assessment and Plan:   Hypertension Continue asa, bisoprolol-HCTZ Discontinue Olmesartan  Well controlled with current medications  Monitor blood pressure at home; patient to call if consistently greater than 130/80 Continue DASH diet.   Reminder to go to the ER if any CP, SOB, nausea, dizziness, severe HA, changes vision/speech, left arm numbness and tingling and jaw pain.  Fatty Liver Monitor LFTs Limit Tylenol and Alcohol use  Hyperlipidemia associated with DM2 Continue Rosuvastatin Discussed lifestyle modifications. Recommended diet heavy in fruits and veggies, omega 3's. Decrease consumption of animal meats, cheeses, and dairy products. Remain active and exercise as tolerated. Continue to monitor. Check lipids/TSH  Diabetes with CKD Continue Semaglutide, Metformin Discontinue NPH insulin Education: Reviewed 'ABCs' of diabetes management  Discussed goals to be met and/or maintained include A1C (<7) Blood pressure (<130/80) Cholesterol (LDL <70) Continue Eye Exam yearly  Continue Dental Exam Q6 mo Discussed dietary recommendations Discussed Physical Activity recommendations Foot exam UTD Check A1C  CKD stage 1 w/DM Discussed how what you eat and drink can aide in kidney protection. Stay well hydrated. Avoid high salt foods. Avoid NSAIDS. Keep BP and BG well controlled.   Take medications as prescribed. Remain active and exercise as tolerated daily. Maintain weight.  Continue to monitor. Check CMP/GFR/Microablumin  Vitamin D Deficiency Below goal of 60-100 last check 01/2022 Continue supplement Monitor levels  Obesity with co morbidities Improving Discussed appropriate BMI Diet modification. Physical activity. Encouraged/praised to build confidence.  Medication management All medications discussed and reviewed in full. All questions and concerns regarding medications addressed.    Anxiety with agitation Continue Lexapro - cut in 1/2  (10 mg am and 10 mg pm) to aide in reduction of ED SE. Overall effective - continue to monitor.  Flu Vaccine Need Administered - patient tolerated well  Orders Placed This Encounter  Procedures   Fluzone Trivalent Flu Vaccine ([place] dose preparattion)   CBC with Differential/Platelet   COMPLETE METABOLIC PANEL WITH GFR   Lipid panel   Hemoglobin A1c   Notify office for further evaluation and treatment, questions or concerns if any reported s/s fail to improve.   The patient was advised to call back or seek an in-person evaluation if any symptoms worsen or if the condition fails to improve as anticipated.   Further disposition pending results of labs. Discussed med's effects and SE's.    I discussed the assessment and treatment plan with the patient. The patient was provided an opportunity to ask questions and all were answered. The patient agreed with the plan and demonstrated an understanding of the instructions.  Discussed med's effects and SE's. Screening labs and tests as requested with regular follow-up as recommended.  I provided 30 minutes of face-to-face time during this encounter including counseling, chart review, and critical decision making was preformed.   Future Appointments  Date Time Provider Department Center  03/26/2023  9:30 AM Lucky Cowboy, MD [facility] None  06/25/2023  9:30 AM Adela Glimpse, NP [facility] None  09/24/2023 10:00 AM Lucky Cowboy, MD [facility] None    ----------------------------------------------------------------------------------------------------------------------  HPI 52 y.o. male  presents for 3 month follow up on hypertension, cholesterol, diabetes, weight and vitamin D deficiency.   Overall he reports feeling well today.  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    He had noticed increase in anxiety with agitation.  Started Lexapro 20 mg over the last three weeks and has  noticed an  improvement in mood but SE of ED.  He takes the medication at night.    BMI is Body mass index is 33.85 kg/m., he has been working on diet and exercise.  He is continuing to take Ozempic for control of DM which is helping with weight loss. Wt Readings from Last 3 Encounters:  12/25/22 256 lb 9.6 oz (116.4 kg)  11/07/22 260 lb (117.9 kg)  09/01/22 258 lb (117 kg)    His blood pressure has been controlled at home, today their BP is BP: 118/78 He has not been taking Olmesartan. Has been out of medication for several week.  He has continued Bisoprolol-HCTZ with asa.   He does not workout. He denies chest pain, shortness of breath, dizziness.   He is on cholesterol medication Rosuvastatin and denies myalgias. His cholesterol is at goal. The cholesterol last visit was:   Lab Results  Component Value Date   CHOL 167 09/01/2022   HDL 48 09/01/2022   LDLCALC 89 09/01/2022   TRIG 208 (H) 09/01/2022   CHOLHDL 3.5 09/01/2022    He has been working on diet and exercise for prediabetes, and denies polydipsia and polyuria. Continues on Ozeempic 1 mg, Metformin, no longer on insulin NPH.   Sugars have been averaging <100 without inuslin.  Last A1C in the office was:  Lab Results  Component Value Date   HGBA1C 6.1 (H) 09/01/2022   Patient is on Vitamin D supplement.   Lab Results  Component Value Date   VD25OH 33 09/01/2022     CKD is stable.  Last GFR:   Lab Results  Component Value Date   EGFR 89 09/01/2022   EGFR 104 05/03/2022   EGFR 98 01/18/2022      Current Medications:  Current Outpatient Medications on File Prior to Visit  Medication Sig   Accu-Chek Softclix Lancets lancets Use as instructed   aspirin 81 MG chewable tablet Chew by mouth daily.   bisoprolol-hydrochlorothiazide (ZIAC) 10-6.25 MG tablet Take 1 tablet by mouth once daily for blood pressure   Blood Glucose Monitoring Suppl DEVI Check glucose 4 times a day and PRN.   escitalopram (LEXAPRO) 20 MG tablet  Take 1 tablet Daily for Mood   glucose blood test strip Check Blood Sugar 4 x /day  ->  before meals & bedtime   metFORMIN (GLUCOPHAGE-XR) 500 MG 24 hr tablet Take  2 tablets 2 x/ day with Meals for Diabetes                                              /                                                    TAKE                                                   BY  MOUTH   rosuvastatin (CRESTOR) 40 MG tablet Take  1 tablet 5 x /week for Cholesterol                                                                             /                                                                   TAKE                                         BY                                                 MOUTH   Semaglutide, 2 MG/DOSE, (OZEMPIC, 2 MG/DOSE,) 8 MG/3ML SOPN Inject  2 mg into skin  every 7 days for Diabetes ( e11.29 )   TURMERIC PO Take by mouth daily.   insulin NPH-regular Human (70-30) 100 UNIT/ML injection Inject 40 units with breakfast, 35 units with dinner. (Patient not taking: Reported on 11/07/2022)   No current facility-administered medications on file prior to visit.     Allergies: No Known Allergies   Medical History:  Past Medical History:  Diagnosis Date   Diabetes mellitus without complication (HCC)    Hyperlipidemia    Hypertension    Morbid obesity (HCC)    Plantar fasciitis    Prediabetes    Vitamin D deficiency    Family history- Reviewed and unchanged Social history- Reviewed and unchanged   Review of Systems: A complete ROS was performed with pertinent positives/negatives noted in the HPI. The remainder of the ROS are negative.  Physical Exam: BP 118/78   Pulse 84   Temp (!) 97.5 F (36.4 C)   Ht 6\' 1"  (1.854 m)   Wt 256 lb 9.6 oz (116.4 kg)   SpO2 94%   BMI 33.85 kg/m  Wt Readings from Last 3 Encounters:  12/25/22 256 lb 9.6 oz (116.4 kg)  11/07/22 260 lb (117.9 kg)  09/01/22 258 lb (117 kg)   General Appearance:  Well nourished, in no apparent distress. Eyes: PERRLA, EOMs, conjunctiva no swelling or erythema Sinuses: No Frontal/maxillary tenderness ENT/Mouth: Ext aud canals clear, TMs without erythema, bulging. No erythema, swelling, or exudate on post pharynx.  Tonsils not swollen or erythematous. Hearing normal.  Neck: Supple, thyroid normal.  Respiratory: Respiratory effort normal, BS equal bilaterally without rales, rhonchi, wheezing or stridor.  Cardio: RRR with no MRGs. Brisk peripheral pulses without edema.  Abdomen: Soft, + BS.  Non tender, no guarding, rebound, hernias, masses. Lymphatics: Non tender without lymphadenopathy.  Musculoskeletal: Full ROM, 5/5 strength, Normal gait Skin: Warm, dry without rashes, lesions, ecchymosis.  Neuro: Cranial nerves intact. No cerebellar symptoms.  Psych: Awake and oriented X 3, normal affect, Insight and Judgment appropriate.    Adela Glimpse, NP 4:46 PM [facility]

## 2022-12-26 LAB — COMPLETE METABOLIC PANEL WITH GFR
AG Ratio: 1.4 (calc) (ref 1.0–2.5)
ALT: 24 U/L (ref 9–46)
AST: 18 U/L (ref 10–35)
Albumin: 4.1 g/dL (ref 3.6–5.1)
Alkaline phosphatase (APISO): 86 U/L (ref 35–144)
BUN: 24 mg/dL (ref 7–25)
CO2: 26 mmol/L (ref 20–32)
Calcium: 9.8 mg/dL (ref 8.6–10.3)
Chloride: 105 mmol/L (ref 98–110)
Creat: 1.17 mg/dL (ref 0.70–1.30)
Globulin: 2.9 g/dL (ref 1.9–3.7)
Glucose, Bld: 110 mg/dL — ABNORMAL HIGH (ref 65–99)
Potassium: 4.6 mmol/L (ref 3.5–5.3)
Sodium: 141 mmol/L (ref 135–146)
Total Bilirubin: 0.4 mg/dL (ref 0.2–1.2)
Total Protein: 7 g/dL (ref 6.1–8.1)
eGFR: 75 mL/min/{1.73_m2} (ref >=60)

## 2022-12-26 LAB — CBC WITH DIFFERENTIAL/PLATELET
Absolute Monocytes: 798 {cells}/uL (ref 200–950)
Basophils Absolute: 51 {cells}/uL (ref 0–200)
Basophils Relative: 0.5 %
Eosinophils Absolute: 253 {cells}/uL (ref 15–500)
Eosinophils Relative: 2.5 %
HCT: 41.2 % (ref 38.5–50.0)
Hemoglobin: 13.4 g/dL (ref 13.2–17.1)
Lymphs Abs: 2656 {cells}/uL (ref 850–3900)
MCH: 29.1 pg (ref 27.0–33.0)
MCHC: 32.5 g/dL (ref 32.0–36.0)
MCV: 89.4 fL (ref 80.0–100.0)
MPV: 10.8 fL (ref 7.5–12.5)
Monocytes Relative: 7.9 %
Neutro Abs: 6343 {cells}/uL (ref 1500–7800)
Neutrophils Relative %: 62.8 %
Platelets: 280 10*3/uL (ref 140–400)
RBC: 4.61 10*6/uL (ref 4.20–5.80)
RDW: 14.4 % (ref 11.0–15.0)
Total Lymphocyte: 26.3 %
WBC: 10.1 10*3/uL (ref 3.8–10.8)

## 2022-12-26 LAB — LIPID PANEL
Cholesterol: 145 mg/dL (ref <200)
HDL: 48 mg/dL (ref >=40)
LDL Cholesterol (Calc): 69 mg/dL
Non-HDL Cholesterol (Calc): 97 mg/dL (ref <130)
Total CHOL/HDL Ratio: 3 (calc) (ref <5.0)
Triglycerides: 215 mg/dL — ABNORMAL HIGH (ref <150)

## 2022-12-26 LAB — HEMOGLOBIN A1C
Hgb A1c MFr Bld: 6.2 %{Hb} — ABNORMAL HIGH (ref <5.7)
Mean Plasma Glucose: 131 mg/dL
eAG (mmol/L): 7.3 mmol/L

## 2023-01-22 ENCOUNTER — Other Ambulatory Visit: Payer: Self-pay | Admitting: Internal Medicine

## 2023-03-25 ENCOUNTER — Encounter: Payer: Self-pay | Admitting: Internal Medicine

## 2023-03-25 NOTE — Patient Instructions (Signed)

## 2023-03-25 NOTE — Progress Notes (Signed)
 [place]      ADULT   &   ADOLESCENT      INTERNAL MEDICINE  Charles Harrell, M.D.          Lonell Rous, ANP        Bascom Necessary, FNP  [facility] [address]  [place], [place]. [address] Telephone [phone] Telefax [phone]  Future Appointments  Date Time Provider Department  03/26/2023                         6 mo ov 11:45 AM Harrell [name], MD [facility]  06/25/2023                         9 mo ov  9:30 AM Necessary Bascom, NP [facility]  09/24/2023                          cpe 10:00 AM Harrell [name], MD [facility]    History of Present Illness:      This very nice 53 y.o. DWM presents  for 6 month follow up with HTN, HLD, T2_Insulin Requiring DM, Morbid Obesity ( BMI 34.67)  and Vitamin D  Deficiency.         Patient is treated for HTN (1995)  & BP has been controlled at home. Today's BP is at goal -  118/70 .   Patient has had no complaints of any cardiac type chest pain, palpitations, dyspnea chet /PND, dizziness, claudication or dependent edema.        Hyperlipidemia is controlled with diet & meds. Patient denies myalgias or other med SE's. Last Lipids were at goal except  elevated Trig's :  Lab Results  Component Value Date   CHOL 145 12/25/2022   HDL 48 12/25/2022   LDLCALC 69 12/25/2022   TRIG 215 (H) 12/25/2022   CHOLHDL 3.0 12/25/2022     Also, the patient has hx/o  Morbid Obesity (BMI  37.21) and consequent INSULIN  REQUIRING T2_DM (A1c 8.4% /2018) w/CKD2 (GFR 87) ( started on Insulin  July 2021) and has had no symptoms of reactive hypoglycemia, diabetic polys, paresthesias or visual blurring.  Patient does report that he has been doing better since on Ozempic   Jan 2022 ( weight 282 #) with less over-eating.  Weight is down 5 # over the last 6 months.  Last A1c was not at goal :  Lab Results  Component Value Date   HGBA1C 6.2 (H) 12/25/2022   Wt Readings from Last 3 Encounters:  03/26/23 255 lb 9.6 oz (115.9  kg)  12/25/22 256 lb 9.6 oz (116.4 kg)  11/07/22 260 lb (117.9 kg)         In 2018, patient had mild Hypothyroidism with elevated TSH which subsequently recovered  suggesting  transient Thyroiditis.                                                   Further, the patient also has history of Vitamin D  Deficiency (21/2018) and supplements vitamin D  without any suspected side-effects. Last vitamin D  was still not at goal :  Lab Results  Component Value Date   VD25OH 33 09/01/2022       Current Outpatient Medications  Medication Instructions   aspirin 81 MG  tablet Daily   bisoprolol -hctz  10-6.25 MG tablet Take 1 tablet  daily   LEXAPRO  20 MG tablet Take 1 tablet Daily    metFORMIN  -XR 500 MG  Take  2 tablets 2 x/ day with Meals    rosuvastatin40 MG tablet Take  1 tablet 5 x /week   Semaglutide , 2 MG/DOSE 8 MG/3ML  INJECT 2 MG  EVERY 7 DAYS   TURMERIC  Daily     No Known Allergies   PMHx:   Past Medical History:  Diagnosis Date   Diabetes mellitus without complication (HCC)    Hyperlipidemia    Hypertension    Morbid obesity (HCC)    Plantar fasciitis    Prediabetes    Vitamin D  deficiency      Immunization History  Administered Date(s) Administered   Influenza Inj Mdck Quad  02/06/2018   PFIZER SARS-COV-2 Vacc 11/19/2019   PPD Test 02/06/2018, 02/25/2019, 03/24/2020   Pneumococcal -23 01/16/2019   Tdap 03/13/2012     Past Surgical History:  Procedure Laterality Date   ROOT CANAL  2021    FHx:    Reviewed / unchanged  SHx:    Reviewed / unchanged   Systems Review:  Constitutional: Denies fever, chills, wt changes, headaches, insomnia, fatigue, night sweats, change in appetite. Eyes: Denies redness, blurred vision, diplopia, discharge, itchy, watery eyes.  ENT: Denies discharge, congestion, post nasal drip, epistaxis, sore throat, earache, hearing loss, dental pain, tinnitus, vertigo, sinus pain, snoring.  CV: Denies chest pain, palpitations, irregular  heartbeat, syncope, dyspnea, diaphoresis, orthopnea, PND, claudication or edema. Respiratory: denies cough, dyspnea, DOE, pleurisy, hoarseness, laryngitis, wheezing.  Gastrointestinal: Denies dysphagia, odynophagia, heartburn, reflux, water brash, abdominal pain or cramps, nausea, vomiting, bloating, diarrhea, constipation, hematemesis, melena, hematochezia  or hemorrhoids. Genitourinary: Denies dysuria, frequency, urgency, nocturia, hesitancy, discharge, hematuria or flank pain. Musculoskeletal: Denies arthralgias, myalgias, stiffness, jt. swelling, pain, limping or strain/sprain.  Skin: Denies pruritus, rash, hives, warts, acne, eczema or change in skin lesion(s). Neuro: No weakness, tremor, incoordination, spasms, paresthesia or pain. Psychiatric: Denies confusion, memory loss or sensory loss. Endo: Denies change in weight, skin or hair change.  Heme/Lymph: No excessive bleeding, bruising or enlarged lymph nodes.  Physical Exam  BP 118/70   Pulse 93   Temp 97.6 F (36.4 C)   Resp 16   Ht 6' (1.829 m)   Wt 255 lb 9.6 oz (115.9 kg)   SpO2 97%   BMI 34.67 kg/m   Appears over nourished  and in no distress.  Eyes: PERRLA, EOMs, conjunctiva no swelling or erythema. Sinuses: No frontal/maxillary tenderness ENT/Mouth: EAC's clear, TM's nl w/o erythema, bulging. Nares clear w/o erythema, swelling, exudates. Oropharynx clear without erythema or exudates. Oral hygiene is good. Tongue normal, non obstructing. Hearing intact.  Neck: Supple. Thyroid  not palpable. Car 2+/2+ without bruits, nodes or JVD. Chest: Respirations nl with BS clear & equal w/o rales, rhonchi, wheezing or stridor.  Cor: Heart sounds normal w/ regular rate and rhythm without sig. murmurs, gallops, clicks or rubs. Peripheral pulses normal and equal  without edema.  Abdomen: Soft & bowel sounds normal. Non-tender w/o guarding, rebound, hernias, masses or organomegaly.  Lymphatics: Unremarkable.  Musculoskeletal: Full ROM  all peripheral extremities, joint stability, 5/5 strength and normal gait.  Skin: Warm, dry without exposed rashes, lesions or ecchymosis apparent.  Neuro: Cranial nerves intact, reflexes equal bilaterally. Sensory-motor testing grossly intact. Tendon reflexes grossly intact.  Pysch: Alert & oriented x 3.  Insight and judgement nl &  appropriate. No ideations.  Assessment and Plan:   1. Ess  - Continue medication, monitor blood pressure at home.  - Continue DASH diet.  Reminder to go to the ER if any CP,  SOB, nausea, dizziness, severe HA, changes vision/speech.  ential hypertension (Primary)  - CBC with Differential/Platelet - COMPLETE METABOLIC PANEL WITH GFR - Magnesium - TSH   2. Hyperlipidemia associated with type 2 diabetes mellitus (HCC)   - Continue diet/meds, exercise,& lifestyle modifications.  - Continue monitor periodic cholesterol/liver & renal functions    - Lipid panel   3. 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  - Monitor appropriate labs   - Hemoglobin A1c - Insulin , random   4. Vitamin D  deficiency   - Continue supplementation   - VITAMIN D  25 Hydroxy    5. Class 2 severe obesity due to excess calories with serious comorbidity &                                                    body mass index (BMI) of 37.0 to 37.9 in adult (HCC)  - COMPLETE METABOLIC PANEL WITH GFR   6. Medication management  - CBC with Differential/Platelet - COMPLETE METABOLIC PANEL WITH GFR - Magnesium - Lipid panel - TSH - Hemoglobin A1c - Insulin , random - VITAMIN D  25 Hydroxy          Discussed  regular exercise, BP monitoring, weight control to achieve/maintain BMI less than 25 and discussed med and SE's.   I discussed the assessment and treatment plan with the patient. The patient was provided an  opportunity to ask questions and all were answered. The patient agreed with the plan and demonstrated an understanding of the instructions.  I provided over 30 minutes of exam, counseling, chart review and  complex critical decision making.        The patient was advised to call back or seek an in-person evaluation if the symptoms worsen or if the condition fails to improve as anticipated.   Charles JONETTA Richards, MD

## 2023-03-26 ENCOUNTER — Ambulatory Visit (INDEPENDENT_AMBULATORY_CARE_PROVIDER_SITE_OTHER): Payer: Managed Care, Other (non HMO) | Admitting: Internal Medicine

## 2023-03-26 ENCOUNTER — Other Ambulatory Visit: Payer: Self-pay

## 2023-03-26 VITALS — BP 118/70 | HR 93 | Temp 97.6°F | Resp 16 | Ht 72.0 in | Wt 255.6 lb

## 2023-03-26 DIAGNOSIS — Z79899 Other long term (current) drug therapy: Secondary | ICD-10-CM

## 2023-03-26 DIAGNOSIS — E559 Vitamin D deficiency, unspecified: Secondary | ICD-10-CM

## 2023-03-26 DIAGNOSIS — I10 Essential (primary) hypertension: Secondary | ICD-10-CM | POA: Diagnosis not present

## 2023-03-26 DIAGNOSIS — N181 Chronic kidney disease, stage 1: Secondary | ICD-10-CM

## 2023-03-26 DIAGNOSIS — E1122 Type 2 diabetes mellitus with diabetic chronic kidney disease: Secondary | ICD-10-CM

## 2023-03-26 DIAGNOSIS — E66812 Obesity, class 2: Secondary | ICD-10-CM

## 2023-03-26 DIAGNOSIS — E785 Hyperlipidemia, unspecified: Secondary | ICD-10-CM | POA: Diagnosis not present

## 2023-03-26 DIAGNOSIS — Z6837 Body mass index (BMI) 37.0-37.9, adult: Secondary | ICD-10-CM

## 2023-03-26 DIAGNOSIS — E1169 Type 2 diabetes mellitus with other specified complication: Secondary | ICD-10-CM

## 2023-03-26 MED ORDER — DEXAMETHASONE 2 MG PO TABS: ORAL_TABLET | ORAL | 0 refills | Status: DC

## 2023-03-26 MED ORDER — TOPIRAMATE 100 MG PO TABS: ORAL_TABLET | ORAL | 1 refills | Status: DC

## 2023-03-26 MED ORDER — PHENTERMINE HCL 37.5 MG PO TABS: ORAL_TABLET | ORAL | 1 refills | Status: DC

## 2023-03-27 LAB — CBC WITH DIFFERENTIAL/PLATELET
Absolute Lymphocytes: 1767 {cells}/uL (ref 850–3900)
Absolute Monocytes: 1094 {cells}/uL — ABNORMAL HIGH (ref 200–950)
Basophils Absolute: 57 {cells}/uL (ref 0–200)
Basophils Relative: 0.5 %
Eosinophils Absolute: 160 {cells}/uL (ref 15–500)
Eosinophils Relative: 1.4 %
HCT: 43.7 % (ref 38.5–50.0)
Hemoglobin: 14.3 g/dL (ref 13.2–17.1)
MCH: 28.9 pg (ref 27.0–33.0)
MCHC: 32.7 g/dL (ref 32.0–36.0)
MCV: 88.5 fL (ref 80.0–100.0)
MPV: 11.1 fL (ref 7.5–12.5)
Monocytes Relative: 9.6 %
Neutro Abs: 8322 {cells}/uL — ABNORMAL HIGH (ref 1500–7800)
Neutrophils Relative %: 73 %
Platelets: 264 10*3/uL (ref 140–400)
RBC: 4.94 10*6/uL (ref 4.20–5.80)
RDW: 14.2 % (ref 11.0–15.0)
Total Lymphocyte: 15.5 %
WBC: 11.4 10*3/uL — ABNORMAL HIGH (ref 3.8–10.8)

## 2023-03-27 LAB — COMPLETE METABOLIC PANEL WITH GFR
AG Ratio: 1.4 (calc) (ref 1.0–2.5)
ALT: 28 U/L (ref 9–46)
AST: 21 U/L (ref 10–35)
Albumin: 4 g/dL (ref 3.6–5.1)
Alkaline phosphatase (APISO): 79 U/L (ref 35–144)
BUN: 16 mg/dL (ref 7–25)
CO2: 26 mmol/L (ref 20–32)
Calcium: 9.6 mg/dL (ref 8.6–10.3)
Chloride: 103 mmol/L (ref 98–110)
Creat: 0.99 mg/dL (ref 0.70–1.30)
Globulin: 2.9 g/dL (ref 1.9–3.7)
Glucose, Bld: 123 mg/dL — ABNORMAL HIGH (ref 65–99)
Potassium: 4.8 mmol/L (ref 3.5–5.3)
Sodium: 138 mmol/L (ref 135–146)
Total Bilirubin: 0.4 mg/dL (ref 0.2–1.2)
Total Protein: 6.9 g/dL (ref 6.1–8.1)
eGFR: 91 mL/min/{1.73_m2} (ref >=60)

## 2023-03-27 LAB — LIPID PANEL
Cholesterol: 178 mg/dL (ref <200)
HDL: 53 mg/dL (ref >=40)
LDL Cholesterol (Calc): 97 mg/dL
Non-HDL Cholesterol (Calc): 125 mg/dL (ref <130)
Total CHOL/HDL Ratio: 3.4 (calc) (ref <5.0)
Triglycerides: 188 mg/dL — ABNORMAL HIGH (ref <150)

## 2023-03-27 LAB — HEMOGLOBIN A1C
Hgb A1c MFr Bld: 6.3 %{Hb} — ABNORMAL HIGH (ref <5.7)
Mean Plasma Glucose: 134 mg/dL
eAG (mmol/L): 7.4 mmol/L

## 2023-03-27 LAB — INSULIN, RANDOM: Insulin: 89.3 u[IU]/mL — ABNORMAL HIGH

## 2023-03-27 LAB — MAGNESIUM: Magnesium: 1.7 mg/dL (ref 1.5–2.5)

## 2023-03-27 LAB — VITAMIN D 25 HYDROXY (VIT D DEFICIENCY, FRACTURES): Vit D, 25-Hydroxy: 17 ng/mL — ABNORMAL LOW (ref 30–100)

## 2023-03-27 LAB — TSH: TSH: 1.06 m[IU]/L (ref 0.40–4.50)

## 2023-03-27 NOTE — Progress Notes (Signed)
 - Test results slightly outside the reference range are not unusual. If there is anything important, I will review this with you,  otherwise it is considered normal test values.  If you have further questions,  please do not hesitate to contact me at the office or via My Chart.   =========================================================================  -  Glucose = 123 mg% - Great !  - A1c = 6.3% - Also Great  !   - And hopefully adding the Phentermine  & Topamax  will continue                                                                           with further weight loss & cure your Diabetes  !   =========================================================================  -  Insulin  = 89.3  ( Normal is less than 20)   And shows insulin  resistance - a sign of  diabetes and   associated with a 300 % greater risk for   heart attacks, strokes, cancer & Alzheimer type vascular dementia   - All this can be cured  and prevented with losing weight   - get Dr Marty Fuhrman's book 'the End of Diabetes and  the End of Dieting  - and add many years of good health to your life.  =========================================================================  -  Vitamin D  = 17 -  is   Extremely & Dangerously LOW !   - Vitamin D  goal is between 70-100.     - Please take  Vitamin D  10,000 units  / day  as directed.     - It is very important as a natural anti-inflammatory and helping the                          immune system protect against viral infections, like Flu  & the Covid    - Also helps hair, skin, and nails, as well as reducing stroke and heart attack risk.   - It helps your bones  &  and helps with mood.  - It also decreases numerous cancer risks, so please                                                                                           take it as directed.   - Low Vit D is associated with a 200-300% higher risk for CANCER                                                 and 200-300% higher risk for HEART   ATTACK  &  STROKE.    - It is also associated with higher death rate at younger ages,  autoimmune diseases like Rheumatoid arthritis, Lupus, Multiple Sclerosis.     - Also many other serious conditions, like depression, Alzheimer's Dementia                                                                             muscle aches, fatigue, fibromyalgia   =========================================================================  -   Magnesium = 1.7  is very,  very  low - goal is betw 2.0 - 2.5,   - So..............SABRA  Recommend that you take                                                             Magnesium 500 mg tablet 3 x / day with Meals  !   - also important to eat lots of  leafy green vegetables   - spinach - Kale - collards - greens - okra - asparagus - broccoli - quinoa   - squash - almonds - black, red, white beans -  peas - green beans  =========================================================================  -  All Else - CBC - Kidneys - Electrolytes - Liver - Magnesium & Thyroid     - all  Normal / OK  =========================================================================

## 2023-04-10 ENCOUNTER — Other Ambulatory Visit (HOSPITAL_BASED_OUTPATIENT_CLINIC_OR_DEPARTMENT_OTHER): Payer: Self-pay

## 2023-05-07 ENCOUNTER — Other Ambulatory Visit: Payer: Self-pay

## 2023-05-07 DIAGNOSIS — I10 Essential (primary) hypertension: Secondary | ICD-10-CM

## 2023-05-07 MED ORDER — BISOPROLOL-HYDROCHLOROTHIAZIDE 10-6.25 MG PO TABS
ORAL_TABLET | ORAL | 0 refills | Status: DC
Start: 2023-05-07 — End: 2023-09-03

## 2023-05-31 ENCOUNTER — Ambulatory Visit: Payer: Managed Care, Other (non HMO) | Admitting: Internal Medicine

## 2023-05-31 ENCOUNTER — Other Ambulatory Visit (HOSPITAL_BASED_OUTPATIENT_CLINIC_OR_DEPARTMENT_OTHER): Payer: Self-pay

## 2023-05-31 ENCOUNTER — Encounter: Payer: Self-pay | Admitting: Internal Medicine

## 2023-05-31 ENCOUNTER — Other Ambulatory Visit: Payer: Self-pay

## 2023-05-31 VITALS — BP 130/90 | HR 84 | Temp 98.0°F | Ht 72.0 in | Wt 242.8 lb

## 2023-05-31 DIAGNOSIS — E559 Vitamin D deficiency, unspecified: Secondary | ICD-10-CM

## 2023-05-31 DIAGNOSIS — K760 Fatty (change of) liver, not elsewhere classified: Secondary | ICD-10-CM

## 2023-05-31 DIAGNOSIS — N181 Chronic kidney disease, stage 1: Secondary | ICD-10-CM

## 2023-05-31 DIAGNOSIS — I10 Essential (primary) hypertension: Secondary | ICD-10-CM | POA: Diagnosis not present

## 2023-05-31 DIAGNOSIS — E1122 Type 2 diabetes mellitus with diabetic chronic kidney disease: Secondary | ICD-10-CM | POA: Diagnosis not present

## 2023-05-31 DIAGNOSIS — Z1211 Encounter for screening for malignant neoplasm of colon: Secondary | ICD-10-CM

## 2023-05-31 DIAGNOSIS — Z6832 Body mass index (BMI) 32.0-32.9, adult: Secondary | ICD-10-CM

## 2023-05-31 DIAGNOSIS — E611 Iron deficiency: Secondary | ICD-10-CM | POA: Diagnosis not present

## 2023-05-31 MED ORDER — VITAMIN D3 125 MCG (5000 UT) PO CAPS
5000.0000 [IU] | ORAL_CAPSULE | Freq: Every day | ORAL | 3 refills | Status: DC
  Filled 2023-05-31: qty 100, 100d supply, fill #0
  Filled 2023-11-07: qty 100, 100d supply, fill #1

## 2023-05-31 MED ORDER — SEMAGLUTIDE (2 MG/DOSE) 8 MG/3ML ~~LOC~~ SOPN
2.0000 mg | PEN_INJECTOR | SUBCUTANEOUS | 11 refills | Status: AC
  Filled 2023-05-31: qty 3, 28d supply, fill #0
  Filled 2023-07-23: qty 3, 28d supply, fill #1
  Filled 2023-08-24: qty 3, 28d supply, fill #2
  Filled 2023-09-28: qty 3, 28d supply, fill #3
  Filled 2023-11-07 – 2023-12-03 (×2): qty 3, 28d supply, fill #4
  Filled 2023-12-31 – 2024-01-25 (×2): qty 3, 28d supply, fill #5
  Filled 2024-03-09: qty 3, 28d supply, fill #6

## 2023-05-31 MED ORDER — LOSARTAN POTASSIUM 25 MG PO TABS
25.0000 mg | ORAL_TABLET | Freq: Every day | ORAL | 3 refills | Status: DC
  Filled 2023-05-31: qty 90, 90d supply, fill #0
  Filled 2023-10-16: qty 90, 90d supply, fill #1

## 2023-05-31 NOTE — Assessment & Plan Note (Signed)
 Low vitamin D levels likely due to dietary changes with weight loss. Prescribe vitamin D3 5000 IU daily to improve energy levels and overall health.

## 2023-05-31 NOTE — Assessment & Plan Note (Signed)
 Current weight is 242 lbs with a goal of 225 lbs. Significant weight loss since 2022, but further weight loss is desired. Emphasize balanced diet, exercise, and consistent medication use. Avoid sugary beverages and incorporate resistance training. Increase Ozempic to 2 mg weekly. Encourage balanced diet and regular exercise, including cardio and resistance training. Consider dietary guidance such as intermittent fasting. Monitor weight and body composition regularly.

## 2023-05-31 NOTE — Progress Notes (Signed)
 [facility]  Phone: [phone]  - Medical Office Visit -  Visit Date: 05/31/2023 Patient: Charles Harrell   DOB: 02/02/1971   53 y.o. Male  MRN: [id number] Patient Care Team: Lula Olszewski, MD as PCP - General (Internal Medicine) Today's Health Care Provider: Lula Olszewski, MD  ===========================================  Chief Complaint / Reason for Visit: New Patient (Initial Visit) and Weight Loss (Has been taking the Ozempic about twice a month instead of weekly. Has not been seeing the desired result. )   Subjective  53 y.o. 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  History of Present Illness Charles Harrell is a 53 year old male with diabetes who presents for follow-up on weight loss and diabetes management.  He has been managing his weight loss and diabetes with Ozempic, which he has been taking twice a month instead of weekly. His current weight is 240 pounds, down from 300 pounds, with a goal to reach 225 pounds. He feels positive about his progress but acknowledges the need to lose an additional 20 pounds. He has a history of diabetes, previously described as 'hard diabetic', with significant improvement in blood sugar levels since starting Ozempic. His last A1c was 6.3, which he considers satisfactory but aims to lower further. He recalls being 300 pounds at his heaviest and has since improved his cholesterol and kidney function. He mentions past kidney damage due to diabetes, which has not been re-evaluated since 07-02-2020. He is currently on bisoprolol for blood pressure management and has been prescribed losartan to protect his kidneys.  He has made dietary changes, including reducing sugary beverages and  cutting out bread, pasta, potatoes, and rice. He drinks three sugary beverages a week, down from five to six a day, and acknowledges the need to further reduce this intake.  He reports low energy levels, which he attributes to low vitamin D.  He mentions a high white blood cell count, potentially linked to a dental infection, as he needs a root canal. He is vigilant about monitoring his feet due to diabetes and is aware of the risks of dental infections.  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 He is single and has experienced significant life changes, including a divorce, which impacted his financial situation. He has been actively working on improving his health since 2018-07-03, following the death of his father, which motivated him to make lifestyle changes.      Problem list overviews that were updated at today's visit: Problem  Fatty Liver   Per [place] 2008/07/02 Lab Results  Component Value Date/Time   ALT 28 03/26/2023 12:00 PM   ALT 24 12/25/2022 04:50 PM   ALT 21 09/01/2022 09:58 AM   ALT 18 05/03/2022 10:32 AM   ALT 26 01/18/2022 10:42 AM   Lab Results  Component Value Date/Time   AST 21 03/26/2023 12:00 PM   AST 18 12/25/2022 04:50 PM   AST 14 09/01/2022 09:58 AM   AST 17 05/03/2022 10:32 AM   AST 21 01/18/2022 10:42 AM   Lab Results  Component Value Date/Time   ALKPHOS 95 04/06/2016 08:52 AM   No components found for: "BILIT" No results found for: "LABGGT"     Ckd Stage 1 Due  to Type 2 Diabetes Mellitus (Hcc)   Nephrologist: No care team member to display  Medicines:     ARB/ACEI:  not yet discussed    SGLT2 inhibitor:   not yet discussed  Imaging:  Labwork:  Creat (mg/dL)  Date Value  78/29/5621 0.99  12/25/2022 1.17  09/01/2022 1.01  05/03/2022 0.86  01/18/2022 0.94  07/12/2021 0.90  10/11/2020 1.07  06/28/2020 1.00  03/24/2020 0.95  12/18/2019 1.06  08/28/2019 1.06  05/28/2019 0.93  02/25/2019 0.94   11/28/2018 1.03  08/29/2018 0.95  05/24/2018 0.89  02/06/2018 0.89  09/17/2017 0.83  02/23/2017 0.84  11/08/2016 0.97  04/06/2016 0.86   No results found for: "GFR" eGFR (mL/min/1.49m2)  Date Value  03/26/2023 91  12/25/2022 75  09/01/2022 89  05/03/2022 104  01/18/2022 98  07/12/2021 103  10/11/2020 84    Lab Results  Component Value Date   VD25OH 17 (L) 03/26/2023   NA 138 03/26/2023   K 4.8 03/26/2023   CL 103 03/26/2023   CO2 26 03/26/2023   CALCIUM 9.6 03/26/2023   PROT 6.9 03/26/2023   ALBUMIN 3.6 04/06/2016   HGB 14.3 03/26/2023   HCT 43.7 03/26/2023   PSA 0.65 09/01/2022   HGBA1C 6.3 (H) 03/26/2023   Lab Results  Component Value Date   COLORURINE YELLOW 03/24/2020   APPEARANCEUR CLEAR 03/24/2020   LABSPEC 1.028 03/24/2020   PHURINE < OR = 5.0 03/24/2020   GLUCOSEU 3+ (A) 03/24/2020   HGBUR NEGATIVE 03/24/2020   KETONESUR NEGATIVE 03/24/2020   PROTEINUR 2+ (A) 03/24/2020   NITRITE NEGATIVE 03/24/2020   LEUKOCYTESUR NEGATIVE 03/24/2020   MICROALBUR 29.9 03/24/2020    Symptoms to Watch For: changes in urination, swelling, fatigue, chest pain, or shortness of breath  Medicines to Avoid:  diuretics (fluid pills), NSAIDs, proton pump inhibitor (PPI) stomach acid reducer, contrasted imaging studies, aminoglycosides, acyclovir, phosphate-based bowel prep, baclofen  Lifestyle and Nutrition:  Limit sodium, potassium, phosphorus and protein intake, consider nutrition consult, smoking avoidance, exercise regularly, manage weight, and reduce stress, control blood sugar, avoid dehydration (may need early emergency room visits for nausea vomiting diarrhea)  Vaccinations: flu/COVID/pneumonia/hepatitis B vaccinations are more important in kidney disease       Medications reviewed and modified: Current Outpatient Medications on File Prior to Visit  Medication Sig   aspirin 81 MG chewable tablet Chew by mouth daily.   bisoprolol-hydrochlorothiazide (ZIAC)  10-6.25 MG tablet Take 1 tablet by mouth once daily for blood pressure   escitalopram (LEXAPRO) 20 MG tablet Take 1 tablet Daily for Mood   metFORMIN (GLUCOPHAGE-XR) 500 MG 24 hr tablet Take  2 tablets 2 x/ day with Meals for Diabetes                                              /                                                    TAKE  BY                                                    MOUTH   rosuvastatin (CRESTOR) 40 MG tablet Take  1 tablet 5 x /week for Cholesterol                                                                             /                                                                   TAKE                                         BY                                                 MOUTH   Semaglutide, 2 MG/DOSE, (OZEMPIC, 2 MG/DOSE,) 8 MG/3ML SOPN INJECT 2 MG INTO THE SKIN EVERY 7 DAYS FOR DIABETES (Patient taking differently: Taking every 10-12 days (twice a month).)   TURMERIC PO Take by mouth daily.   Accu-Chek Softclix Lancets lancets Use as instructed (Patient not taking: Reported on 05/31/2023)   Blood Glucose Monitoring Suppl DEVI Check glucose 4 times a day and PRN. (Patient not taking: Reported on 05/31/2023)   dexamethasone (DECADRON) 2 MG tablet Take 1 tab 3 x day for 5 days,  then 2 x day for 5 days, then 1 tab daily (Patient not taking: Reported on 05/31/2023)   glucose blood test strip Check Blood Sugar 4 x /day  ->  before meals & bedtime (Patient not taking: Reported on 05/31/2023)   phentermine (ADIPEX-P) 37.5 MG tablet Take 1/2 to 1 tablet every Morning for Dieting & Weight Loss         /       TAKE      BY      MOUTH (Patient not taking: Reported on 05/31/2023)   topiramate (TOPAMAX) 100 MG tablet Take 1/2 to 1 tablet 2 x /day at Suppertime & Bedtime for Dieting & Weight Loss          /       TAKE      BY      MOUTH (Patient not taking: Reported on 05/31/2023)   No current facility-administered medications on file  prior to visit.  There are no discontinued medications.  Problems: has Essential hypertension; Hyperlipidemia associated with type 2 diabetes mellitus (HCC); Type 2 diabetes mellitus (HCC); Vitamin D deficiency; Morbid obesity (HCC); CKD stage 1 due to type 2 diabetes  mellitus (HCC); Fatty liver; and Iron deficiency anemia on their problem list. Current Meds  Medication Sig   aspirin 81 MG chewable tablet Chew by mouth daily.   bisoprolol-hydrochlorothiazide (ZIAC) 10-6.25 MG tablet Take 1 tablet by mouth once daily for blood pressure   Cholecalciferol (VITAMIN D3) 125 MCG (5000 UT) CAPS Take 1 capsule (5,000 Units total) by mouth daily.   escitalopram (LEXAPRO) 20 MG tablet Take 1 tablet Daily for Mood   losartan (COZAAR) 25 MG tablet Take 1 tablet (25 mg total) by mouth daily.   metFORMIN (GLUCOPHAGE-XR) 500 MG 24 hr tablet Take  2 tablets 2 x/ day with Meals for Diabetes                                              /                                                    TAKE                                                   BY                                                    MOUTH   rosuvastatin (CRESTOR) 40 MG tablet Take  1 tablet 5 x /week for Cholesterol                                                                             /                                                                   TAKE                                         BY                                                 MOUTH   Semaglutide, 2 MG/DOSE, (OZEMPIC, 2 MG/DOSE,) 8 MG/3ML SOPN INJECT 2 MG INTO THE SKIN EVERY 7 DAYS FOR DIABETES (Patient taking differently: Taking every 10-12 days (twice a month).)   Semaglutide, 2  MG/DOSE, 8 MG/3ML SOPN Inject 2 mg into the skin once a week.   TURMERIC PO Take by mouth daily.   Allergies:   Allergies  Allergen Reactions   Poison Ivy Extract    Past Medical History:  has a past medical history of Depression, Diabetes mellitus without complication (HCC), Hyperlipidemia,  Hypertension, Morbid obesity (HCC), Plantar fasciitis, and Vitamin D deficiency. Past Surgical History:   has a past surgical history that includes Root canal (2021). Social History:   reports that he quit smoking about 12 years ago. His smoking use included cigarettes. He has never used smokeless tobacco. He reports that he does not currently use drugs after having used the following drugs: Marijuana. He reports that he does not drink alcohol. Family History:  family history includes COPD in his father; Diabetes in his father; Early death in his father; Heart disease in his father; Hyperlipidemia in his father and mother; Hypertension in his father and mother; Lung cancer in his mother. Depression Screen and Health Maintenance:    05/31/2023    8:48 AM 03/25/2023   11:45 PM 11/12/2022   10:32 PM 09/01/2022    6:00 PM  PHQ 2/9 Scores  PHQ - 2 Score 0 0 0 0   Health Maintenance  Topic Date Due   Zoster Vaccines- Shingrix (1 of 2) Never done   OPHTHALMOLOGY EXAM  12/12/2019   Pneumococcal Vaccine 62-70 Years old (2 of 2 - PCV) 01/16/2020   Diabetic kidney evaluation - Urine ACR  03/24/2021   DTaP/Tdap/Td (2 - Td or Tdap) 03/13/2022   Colonoscopy  02/02/2023   Hepatitis C Screening  05/30/2024 (Originally 06/08/1988)   HIV Screening  05/30/2024 (Originally 06/07/1984)   FOOT EXAM  08/31/2023   HEMOGLOBIN A1C  09/23/2023   Diabetic kidney evaluation - eGFR measurement  03/25/2024   INFLUENZA VACCINE  Completed   HPV VACCINES  Aged Out   COVID-19 Vaccine  Discontinued   Immunization History  Administered Date(s) Administered   Fluzone Influenza virus vaccine,trivalent (IIV3), split virus 12/25/2022   Influenza Inj Mdck Quad With Preservative 02/06/2018   Influenza,inj,Quad PF,6+ Mos 01/18/2022   PFIZER(Purple Top)SARS-COV-2 Vaccination 10/22/2019, 11/19/2019   PPD Test 02/06/2018, 02/25/2019, 03/24/2020, 09/01/2022   Pneumococcal Polysaccharide-23 01/16/2019   Tdap 03/13/2012      Objective   Physical ExamBP (!) 130/90   Pulse 84   Temp 98 F (36.7 C) (Temporal)   Ht 6' (1.829 m)   Wt 242 lb 12.8 oz (110.1 kg)   SpO2 96%   BMI 32.93 kg/m  Wt Readings from Last 10 Encounters:  05/31/23 242 lb 12.8 oz (110.1 kg)  03/26/23 255 lb 9.6 oz (115.9 kg)  12/25/22 256 lb 9.6 oz (116.4 kg)  11/07/22 260 lb (117.9 kg)  09/01/22 258 lb (117 kg)  05/03/22 265 lb (120.2 kg)  01/18/22 271 lb (122.9 kg)  10/17/21 283 lb 3.2 oz (128.5 kg)  07/12/21 287 lb 12.8 oz (130.5 kg)  03/24/21 286 lb 6.4 oz (129.9 kg)  Vital signs reviewed.  Nursing notes reviewed. Weight trend reviewed. Abnormalities and problem-specific physical exam findings:  truncal adiposity, muscular. General Appearance:  Well developed, well nourished, well-groomed, healthy-appearing male with Body mass index is 32.93 kg/m. No acute distress appreciable.   Skin: Clear and well-hydrated. Pulmonary:  Normal work of breathing at rest, no respiratory distress apparent. SpO2: 96 %  Musculoskeletal: He demonstrates smooth and coordinated movements throughout all major joints.All extremities are intact.  Neurological:  Awake, alert, oriented,  and engaged.  No obvious focal neurological deficits or cognitive impairments.  Sensorium seems unclouded.  Psychiatric:  Appropriate mood, pleasant and cooperative demeanor, cheerful and engaged during the exam  Reviewed Results & Data Results LABS A1c: 6.3% WBC: elevated    No results found for any visits on 05/31/23.  Office Visit on 03/26/2023  Component Date Value   WBC 03/26/2023 11.4 (H)    RBC 03/26/2023 4.94    Hemoglobin 03/26/2023 14.3    HCT 03/26/2023 43.7    MCV 03/26/2023 88.5    MCH 03/26/2023 28.9    MCHC 03/26/2023 32.7    RDW 03/26/2023 14.2    Platelets 03/26/2023 264    MPV 03/26/2023 11.1    Neutro Abs 03/26/2023 8,322 (H)    Absolute Lymphocytes 03/26/2023 1,767    Absolute Monocytes 03/26/2023 1,094 (H)    Eosinophils Absolute  03/26/2023 160    Basophils Absolute 03/26/2023 57    Neutrophils Relative % 03/26/2023 73    Total Lymphocyte 03/26/2023 15.5    Monocytes Relative 03/26/2023 9.6    Eosinophils Relative 03/26/2023 1.4    Basophils Relative 03/26/2023 0.5    Glucose, Bld 03/26/2023 123 (H)    BUN 03/26/2023 16    Creat 03/26/2023 0.99    eGFR 03/26/2023 91    BUN/Creatinine Ratio 03/26/2023 SEE NOTE:    Sodium 03/26/2023 138    Potassium 03/26/2023 4.8    Chloride 03/26/2023 103    CO2 03/26/2023 26    Calcium 03/26/2023 9.6    Total Protein 03/26/2023 6.9    Albumin 03/26/2023 4.0    Globulin 03/26/2023 2.9    AG Ratio 03/26/2023 1.4    Total Bilirubin 03/26/2023 0.4    Alkaline phosphatase (AP* 03/26/2023 79    AST 03/26/2023 21    ALT 03/26/2023 28    Magnesium 03/26/2023 1.7    Cholesterol 03/26/2023 178    HDL 03/26/2023 53    Triglycerides 03/26/2023 188 (H)    LDL Cholesterol (Calc) 03/26/2023 97    Total CHOL/HDL Ratio 03/26/2023 3.4    Non-HDL Cholesterol (Cal* 03/26/2023 125    TSH 03/26/2023 1.06    Hgb A1c MFr Bld 03/26/2023 6.3 (H)    Mean Plasma Glucose 03/26/2023 134    eAG (mmol/L) 03/26/2023 7.4    Insulin 03/26/2023 89.3 (H)    Vit D, 25-Hydroxy 03/26/2023 17 (L)   Office Visit on 12/25/2022  Component Date Value   WBC 12/25/2022 10.1    RBC 12/25/2022 4.61    Hemoglobin 12/25/2022 13.4    HCT 12/25/2022 41.2    MCV 12/25/2022 89.4    MCH 12/25/2022 29.1    MCHC 12/25/2022 32.5    RDW 12/25/2022 14.4    Platelets 12/25/2022 280    MPV 12/25/2022 10.8    Neutro Abs 12/25/2022 6,343    Lymphs Abs 12/25/2022 2,656    Absolute Monocytes 12/25/2022 798    Eosinophils Absolute 12/25/2022 253    Basophils Absolute 12/25/2022 51    Neutrophils Relative % 12/25/2022 62.8    Total Lymphocyte 12/25/2022 26.3    Monocytes Relative 12/25/2022 7.9    Eosinophils Relative 12/25/2022 2.5    Basophils Relative 12/25/2022 0.5    Glucose, Bld 12/25/2022 110 (H)    BUN  12/25/2022 24    Creat 12/25/2022 1.17    eGFR 12/25/2022 75    BUN/Creatinine Ratio 12/25/2022 SEE NOTE:    Sodium 12/25/2022 141    Potassium 12/25/2022 4.6    Chloride 12/25/2022  105    CO2 12/25/2022 26    Calcium 12/25/2022 9.8    Total Protein 12/25/2022 7.0    Albumin 12/25/2022 4.1    Globulin 12/25/2022 2.9    AG Ratio 12/25/2022 1.4    Total Bilirubin 12/25/2022 0.4    Alkaline phosphatase (AP* 12/25/2022 86    AST 12/25/2022 18    ALT 12/25/2022 24    Cholesterol 12/25/2022 145    HDL 12/25/2022 48    Triglycerides 12/25/2022 215 (H)    LDL Cholesterol (Calc) 12/25/2022 69    Total CHOL/HDL Ratio 12/25/2022 3.0    Non-HDL Cholesterol (Cal* 12/25/2022 97    Hgb A1c MFr Bld 12/25/2022 6.2 (H)    Mean Plasma Glucose 12/25/2022 131    eAG (mmol/L) 12/25/2022 7.3   Office Visit on 09/01/2022  Component Date Value   Iron 09/01/2022 59    TIBC 09/01/2022 330    %SAT 09/01/2022 18 (L)    Vitamin B-12 09/01/2022 319    PSA 09/01/2022 0.65    Testosterone 09/01/2022 273    WBC 09/01/2022 10.3    RBC 09/01/2022 4.64    Hemoglobin 09/01/2022 13.4    HCT 09/01/2022 41.0    MCV 09/01/2022 88.4    MCH 09/01/2022 28.9    MCHC 09/01/2022 32.7    RDW 09/01/2022 14.9    Platelets 09/01/2022 268    MPV 09/01/2022 10.7    Neutro Abs 09/01/2022 7,200    Lymphs Abs 09/01/2022 1,967    Absolute Monocytes 09/01/2022 783    Eosinophils Absolute 09/01/2022 299    Basophils Absolute 09/01/2022 52    Neutrophils Relative % 09/01/2022 69.9    Total Lymphocyte 09/01/2022 19.1    Monocytes Relative 09/01/2022 7.6    Eosinophils Relative 09/01/2022 2.9    Basophils Relative 09/01/2022 0.5    Glucose, Bld 09/01/2022 120 (H)    BUN 09/01/2022 15    Creat 09/01/2022 1.01    eGFR 09/01/2022 89    BUN/Creatinine Ratio 09/01/2022 SEE NOTE:    Sodium 09/01/2022 139    Potassium 09/01/2022 4.5    Chloride 09/01/2022 105    CO2 09/01/2022 26    Calcium 09/01/2022 9.6    Total Protein  09/01/2022 7.1    Albumin 09/01/2022 4.2    Globulin 09/01/2022 2.9    AG Ratio 09/01/2022 1.4    Total Bilirubin 09/01/2022 0.5    Alkaline phosphatase (AP* 09/01/2022 89    AST 09/01/2022 14    ALT 09/01/2022 21    Magnesium 09/01/2022 1.7    Cholesterol 09/01/2022 167    HDL 09/01/2022 48    Triglycerides 09/01/2022 208 (H)    LDL Cholesterol (Calc) 09/01/2022 89    Total CHOL/HDL Ratio 09/01/2022 3.5    Non-HDL Cholesterol (Cal* 09/01/2022 119    TSH 09/01/2022 2.00    Hgb A1c MFr Bld 09/01/2022 6.1 (H)    Mean Plasma Glucose 09/01/2022 128    eAG (mmol/L) 09/01/2022 7.1    Vit D, 25-Hydroxy 09/01/2022 33    No image results found.   No results found.  DG Chest 2 View Result Date: 10/17/2011 *RADIOLOGY REPORT* Clinical Data: Hypertension. CHEST - 2 VIEW Comparison: 03/31/2005 Findings: Mild central peribronchial thickening. Heart and mediastinal contours are within normal limits.  No focal opacities or effusions.  No acute bony abnormality. IMPRESSION: Stable mild chronic bronchitic changes. Original Report Authenticated By: Cyndie Chime, M.D.           Assessment & Plan  Type 2 diabetes mellitus with stage 1 chronic kidney disease, without long-term current use of insulin (HCC) Diabetes previously is poorly controlled but shows significant improvement in blood glucose levels and A1c since 2022, with a current A1c of 6.3. Previous kidney damage likely resolved with better control. Transitioning to weekly Ozempic dosing to avoid weight cycling and improve glucose control. Potential transition to phentermine and topiramate for weight loss and depression management if Ozempic is not tolerated. Continue Ozempic, increasing to 2 mg weekly. Schedule labs for April 14th, including A1c and other diabetes-related tests. Screen for colon cancer Per patient request will order Cologuard, history colonoscopy but he would like to do Vitamin D deficiency Low vitamin D levels likely due to  dietary changes with weight loss. Prescribe vitamin D3 5000 IU daily to improve energy levels and overall health. Iron deficiency Low iron levels possibly related to dietary changes. Order iron studies with next set of labs to monitor levels. Hypertension, unspecified type Blood pressure is elevated at 130/90 mmHg. Currently on bisoprolol. Addition of losartan discussed for better blood pressure control and renal protection due to history of kidney damage. Prescribe losartan and monitor blood pressure at home. 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  Fatty liver Continue(s) with weight loss  Morbid obesity (HCC) Current weight is 242 lbs with a goal of 225 lbs. Significant weight loss since 2022, but further weight loss is desired. Emphasize balanced diet, exercise, and consistent medication use. Avoid sugary beverages and incorporate resistance training. Increase Ozempic to 2 mg weekly. Encourage balanced diet and regular exercise, including cardio and resistance training. Consider dietary guidance such as intermittent fasting. Monitor weight and body composition regularly. Possible dental infection due to need for root canal, potentially contributing to elevated white blood cell count. Advise dental evaluation and treatment for root canal to prevent serious complications.   General Health Maintenance   Emphasize regular health screenings and lifestyle modifications. Encourage increased water intake to maintain hydration and protect kidney health. Schedule Cologuard test if not done within the last three years.  Follow-up   Regular follow-up is important to monitor progress and adjust treatment plans. Labs scheduled for April 14th to assess diabetes control, kidney function, and other health parameters. Schedule follow-up appointment after April 14th labs to review results and adjust treatment plan as necessary.  Consider MyChart follow-up for lab results review.  Diagnoses and all orders for this visit: Type 2 diabetes mellitus with stage 1 chronic kidney disease, without long-term current use of insulin (HCC) -     Microalbumin / creatinine urine ratio; Future -     Cholecalciferol (VITAMIN D3) 125 MCG (5000 UT) CAPS; Take 1 capsule (5,000 Units total) by mouth daily. -     Testosterone; Future -     CBC with Differential/Platelet; Future -     Comprehensive metabolic panel; Future -     Lipid panel; Future -     Hemoglobin A1c; Future -     Microalbumin / creatinine urine ratio; Future -     Lipid panel; Future -     Insulin, random; Future -     Semaglutide, 2 MG/DOSE, 8 MG/3ML SOPN; Inject 2 mg into the skin once a week. Screen for colon cancer -     Cologuard; Future Vitamin D deficiency Iron deficiency -     IBC + Ferritin; Future Hypertension, unspecified type -  losartan (COZAAR) 25 MG tablet; Take 1 tablet (25 mg total) by mouth daily. 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    Additional Info: This encounter employed state-of-the-art, real-time, collaborative documentation. The patient actively reviewed and assisted in updating their electronic medical record on a shared screen, ensuring transparency and facilitating joint problem-solving for the problem list, overview, and plan. This approach  promotes accurate, informed care. The treatment plan was discussed and reviewed in detail, including medication safety, potential side effects, and all patient questions. We confirmed understanding and comfort with the plan. Follow-up instructions were established, including contacting the office for any concerns, returning if symptoms worsen, persist, or new symptoms develop, and precautions for potential emergency department visits.  Initial Appointment Goals:  This initial visit focused on establishing a foundation for the patient's care. We collaboratively reviewed his medical history and medications in detail, updating the chart as shown in the encounter. Given the extensive information, we prioritized addressing his most pressing concerns, which he reported were: New Patient (Initial Visit) and Weight Loss (Has been taking the Ozempic about twice a month instead of weekly. Has not been seeing the desired result. )  While the complexity of the patient's medical picture may necessitate further evaluation in subsequent visits, we were able to develop a preliminary care plan together. To expedite a comprehensive plan at the next visit, we encouraged the patient to gather relevant medical records from previous providers. This collaborative approach will ensure a more complete understanding of the patient's health and inform the development of a personalized care plan. We look forward to continuing the conversation and working together with the patient on achieving his health goals.   Collaborative Documentation:  Today's encounter utilized real-time, dynamic patient engagement.  Patients actively participate by directly reviewing and assisting in updating their medical records through a shared screen. This transparency empowers patients to visually confirm chart updates made by the healthcare provider.  This collaborative approach facilitates problem management as we jointly update the problem list, problem  overview, and assessment/plan. Ultimately, this process enhances chart accuracy and completeness, fostering shared decision-making, patient education, and informed consent for tests and treatments.  Collaborative Treatment Planning:  Treatment plans were discussed and reviewed in detail.  Explained medication safety and potential side effects.  Encouraged participation and answered all patient questions, confirming understanding and comfort with the plan. Encouraged patient to contact our office if they have any questions or concerns. Agreed on patient returning to office if symptoms worsen, persist, or new symptoms develop.  ----------------------------------------------------- Lula Olszewski, MD  05/31/2023 6:05 PM  [facility]:  [phone]

## 2023-05-31 NOTE — Assessment & Plan Note (Signed)
 Diabetes previously is poorly controlled but shows significant improvement in blood glucose levels and A1c since 2022, with a current A1c of 6.3. Previous kidney damage likely resolved with better control. Transitioning to weekly Ozempic dosing to avoid weight cycling and improve glucose control. Potential transition to phentermine and topiramate for weight loss and depression management if Ozempic is not tolerated. Continue Ozempic, increasing to 2 mg weekly. Schedule labs for April 14th, including A1c and other diabetes-related tests.

## 2023-05-31 NOTE — Assessment & Plan Note (Signed)
 No results found for: "GFR" Will order lab testing to guide management.

## 2023-05-31 NOTE — Assessment & Plan Note (Signed)
 Continue(s) with weight loss

## 2023-05-31 NOTE — Patient Instructions (Signed)
 VISIT SUMMARY:  Today, we discussed your progress in managing your diabetes and weight loss. You have made significant strides in lowering your weight and improving your blood sugar levels. We also addressed your blood pressure, vitamin D deficiency, potential dental infection, and general health maintenance. Your commitment to dietary changes and medication adherence is commendable, and we have outlined a plan to continue your progress.  YOUR PLAN:  -TYPE 2 DIABETES MELLITUS: Type 2 diabetes is a condition where your body does not use insulin properly, leading to high blood sugar levels. Your diabetes has shown significant improvement, with your A1c now at 6.3. We will transition you to a weekly dose of Ozempic at 2 mg to help avoid weight cycling and improve glucose control. Labs are scheduled for April 14th to monitor your diabetes-related tests.  -HYPERTENSION: Hypertension, or high blood pressure, can lead to serious health issues if not managed. Your blood pressure is currently 130/90 mmHg. We will add losartan to your current medication regimen to better control your blood pressure and protect your kidneys. Please monitor your blood pressure at home regularly.  -OBESITY: Obesity is a condition where excess body fat negatively affects your health. You have made great progress, losing 60 pounds since 2022. We aim to reach your goal weight of 225 pounds by continuing a balanced diet, regular exercise, and increasing your Ozempic dose to 2 mg weekly. Avoid sugary beverages and consider incorporating resistance training and possibly intermittent fasting.  -VITAMIN D DEFICIENCY: Vitamin D deficiency can lead to low energy levels and other health issues. We will prescribe vitamin D3 at 5000 IU daily to help improve your energy levels and overall health.  -DENTAL INFECTION: A dental infection can cause elevated white blood cell counts and other complications. You need a root canal, so please schedule a  dental evaluation and treatment to prevent serious complications.  -IRON DEFICIENCY: Iron deficiency can cause fatigue and other symptoms. We will order iron studies with your next set of labs to monitor your levels.  -GENERAL HEALTH MAINTENANCE: Regular health screenings and lifestyle modifications are important for overall well-being. Please increase your water intake to stay hydrated and protect your kidneys. If you haven't had a Cologuard test in the last three years, please schedule one.  INSTRUCTIONS:  Please follow up with labs scheduled for April 14th to assess your diabetes control, kidney function, and other health parameters. Schedule a follow-up appointment after the labs to review results and adjust your treatment plan as necessary. Consider using MyChart for lab results review.  Welcome aboard!   Today's visit was a valuable first step in understanding your health and starting your personalized care journey. We discussed your medical history and medications in detail. Given the extensive information, we prioritized addressing your most pressing concerns.  We understood those concerns to be:  New Patient (Initial Visit) and Weight Loss (Has been taking the Ozempic about twice a month instead of weekly. Has not been seeing the desired result. )   Building a Complete Picture  To create the most effective care plan possible, we may need additional information from previous providers. We encouraged you to gather any relevant medical records for your next visit. This will help [place] build a more complete picture and develop a personalized plan together. In the meantime, we'll address your immediate concerns and provide resources to help you manage all of your medical issues.  We encourage you to use MyChart to review these efforts, and to help [place] find and  correct any omissions or errors in your medical chart.  Managing Your Health Over Time  Managing every aspect of your health in a single  visit isn't always feasible, but that's okay.  We addressed your most pressing concerns today and charted a course for future care. Acute conditions or preventive care measures may require further attention.  We encourage you to schedule a follow-up visit at your earliest convenience to discuss any unresolved issues.  We strongly encourage participation in annual preventive care visits to help [place] develop a more thorough understanding of your health and to help you maintain optimal wellness - please inquire about scheduling your next one with [place] at your earliest convenience.  Your Satisfaction Matters  It was a pleasure seeing you today!  Your health and satisfaction will always be my top priorities. If you believe your experience today was worthy of a 5-star rating, I'd be grateful for your feedback!  Lula Olszewski, MD   Next Steps  Schedule Follow-Up:  We recommend a follow-up appointment in No follow-ups on file. If your condition worsens before then, please call us or seek emergency care. Preventive Care:  Don't forget to schedule your annual preventive care visit!  This important checkup is typically covered by insurance and helps identify potential health issues early.  Typically its 100% insurance covered with no co-pay and helps to get surveillance labwork paid for through your insurance provider.  Sometimes it even lowers your insurance premiums to participate. Medical Information Release:  For any relevant medical information we don't have, please sign a release form so we can obtain it for your records. Lab & X-ray Appointments:  Scheduled any incomplete lab tests today or call us to schedule.  X-Rays can be done without an appointment at [facility] at [facility] ([address]), M-F 8:30am-noon or 1pm-5pm.  Just tell them you're there for X-rays ordered by Dr. Jon Billings.  We'll receive the results and contact you by phone or MyChart to discuss next steps.  Bring to Your  Next Appointment  Medications: Please bring all your medication bottles to your next appointment to ensure we have an accurate record of your prescriptions. Health Diaries: If you're monitoring any health conditions at home, keeping a diary of your readings can be very helpful for discussions at your next appointment.  Reviewing Your Records  Please Review this early draft of your clinical notes below and the final encounter summary tomorrow on MyChart after its been completed.   Type 2 diabetes mellitus with stage 1 chronic kidney disease, without long-term current use of insulin (HCC) -     Microalbumin / creatinine urine ratio; Future -     Vitamin D3; Take 1 capsule (5,000 Units total) by mouth daily.  Dispense: 100 capsule; Refill: 3 -     Testosterone; Future -     CBC with Differential/Platelet; Future -     Comprehensive metabolic panel; Future -     Lipid panel; Future -     Hemoglobin A1c; Future -     Microalbumin / creatinine urine ratio; Future -     Lipid panel; Future -     Insulin, random; Future -     Semaglutide (2 MG/DOSE); Inject 2 mg into the skin once a week.  Dispense: 3 mL; Refill: 11  Screen for colon cancer -     Cologuard; Future  Vitamin D deficiency  Iron deficiency -     IBC + Ferritin; Future  Hypertension, unspecified type -     Losartan Potassium; Take 1 tablet (25 mg total) by mouth daily.  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 Complex Concerns: If your concern is more complex, scheduling an appointment might be best. Discuss this with the staff to find the most suitable option. Lab & Imaging Results: We'll contact you directly if results are abnormal or you don't use MyChart. Most normal  results will be on MyChart within 2-3 business days, with a review message from Dr. Jon Billings. Haven't heard back in 2 weeks? Need results sooner? Contact us at [phone]. Referrals: Our referral coordinator will manage specialist referrals. The specialist's office should contact you within 2 weeks to schedule an appointment. Call us if you haven't heard from them after 2 weeks.  Staying Connected  MyChart: Activate your MyChart for the fastest way to access results and message [place]. See the last page of this paperwork for instructions.  Billing  X-ray & Lab Orders: These are billed by separate companies. Contact the invoicing company directly for questions or concerns. Visit Charges: Discuss any billing inquiries with our administrative services team.  Feedback & Satisfaction  Share Your Experience: We strive for your satisfaction! If you have any complaints, please let Dr. Jon Billings know directly or contact our Practice Administrators, Edwena Felty or [employer], by asking at the front desk.  Scheduling Tips  Shorter Wait Times: 8 am and 1 pm appointments often have the quickest wait times. Longer Appointments: If you need more time during your visit, talk to the front desk. Due to insurance regulations, multiple back-to-back appointments might be necessary.

## 2023-06-25 ENCOUNTER — Ambulatory Visit: Payer: Managed Care, Other (non HMO) | Admitting: Nurse Practitioner

## 2023-06-25 ENCOUNTER — Other Ambulatory Visit

## 2023-06-28 ENCOUNTER — Other Ambulatory Visit (INDEPENDENT_AMBULATORY_CARE_PROVIDER_SITE_OTHER)

## 2023-06-28 DIAGNOSIS — E1122 Type 2 diabetes mellitus with diabetic chronic kidney disease: Secondary | ICD-10-CM | POA: Diagnosis not present

## 2023-06-28 DIAGNOSIS — N181 Chronic kidney disease, stage 1: Secondary | ICD-10-CM | POA: Diagnosis not present

## 2023-06-28 DIAGNOSIS — E611 Iron deficiency: Secondary | ICD-10-CM

## 2023-06-28 LAB — CBC WITH DIFFERENTIAL/PLATELET
Basophils Absolute: 0.1 10*3/uL (ref 0.0–0.1)
Basophils Relative: 0.5 % (ref 0.0–3.0)
Eosinophils Absolute: 0.3 10*3/uL (ref 0.0–0.7)
Eosinophils Relative: 3.3 % (ref 0.0–5.0)
HCT: 42.6 % (ref 39.0–52.0)
Hemoglobin: 14.5 g/dL (ref 13.0–17.0)
Lymphocytes Relative: 23.2 % (ref 12.0–46.0)
Lymphs Abs: 2.4 10*3/uL (ref 0.7–4.0)
MCHC: 33.9 g/dL (ref 30.0–36.0)
MCV: 89.8 fl (ref 78.0–100.0)
Monocytes Absolute: 0.8 10*3/uL (ref 0.1–1.0)
Monocytes Relative: 7.2 % (ref 3.0–12.0)
Neutro Abs: 6.9 10*3/uL (ref 1.4–7.7)
Neutrophils Relative %: 65.8 % (ref 43.0–77.0)
Platelets: 286 10*3/uL (ref 150.0–400.0)
RBC: 4.74 Mil/uL (ref 4.22–5.81)
RDW: 14.8 % (ref 11.5–15.5)
WBC: 10.5 10*3/uL (ref 4.0–10.5)

## 2023-06-28 LAB — LIPID PANEL
Cholesterol: 148 mg/dL (ref 0–200)
HDL: 49.5 mg/dL (ref >39.00)
LDL Cholesterol: 72 mg/dL (ref 0–99)
NonHDL: 98.73
Total CHOL/HDL Ratio: 3
Triglycerides: 136 mg/dL (ref 0.0–149.0)
VLDL: 27.2 mg/dL (ref 0.0–40.0)

## 2023-06-28 LAB — IBC + FERRITIN
Ferritin: 62.8 ng/mL (ref 22.0–322.0)
Iron: 59 ug/dL (ref 42–165)
Saturation Ratios: 15.7 % — ABNORMAL LOW (ref 20.0–50.0)
TIBC: 376.6 ug/dL (ref 250.0–450.0)
Transferrin: 269 mg/dL (ref 212.0–360.0)

## 2023-06-28 LAB — COMPREHENSIVE METABOLIC PANEL WITH GFR
ALT: 20 U/L (ref 0–53)
AST: 19 U/L (ref 0–37)
Albumin: 4.5 g/dL (ref 3.5–5.2)
Alkaline Phosphatase: 84 U/L (ref 39–117)
BUN: 11 mg/dL (ref 6–23)
CO2: 30 meq/L (ref 19–32)
Calcium: 10 mg/dL (ref 8.4–10.5)
Chloride: 101 meq/L (ref 96–112)
Creatinine, Ser: 0.98 mg/dL (ref 0.40–1.50)
GFR: 87.7 mL/min (ref >60.00)
Glucose, Bld: 123 mg/dL — ABNORMAL HIGH (ref 70–99)
Potassium: 5.1 meq/L (ref 3.5–5.1)
Sodium: 139 meq/L (ref 135–145)
Total Bilirubin: 0.6 mg/dL (ref 0.2–1.2)
Total Protein: 7.2 g/dL (ref 6.0–8.3)

## 2023-06-28 LAB — TESTOSTERONE: Testosterone: 221.04 ng/dL — ABNORMAL LOW (ref 300.00–890.00)

## 2023-06-28 LAB — HEMOGLOBIN A1C: Hgb A1c MFr Bld: 6 % (ref 4.6–6.5)

## 2023-06-29 ENCOUNTER — Encounter: Payer: Self-pay | Admitting: Internal Medicine

## 2023-06-29 LAB — INSULIN, RANDOM: Insulin: 18.8 u[IU]/mL — ABNORMAL HIGH

## 2023-07-20 ENCOUNTER — Other Ambulatory Visit: Payer: Self-pay | Admitting: Internal Medicine

## 2023-07-20 ENCOUNTER — Other Ambulatory Visit (HOSPITAL_BASED_OUTPATIENT_CLINIC_OR_DEPARTMENT_OTHER): Payer: Self-pay

## 2023-07-20 DIAGNOSIS — E782 Mixed hyperlipidemia: Secondary | ICD-10-CM

## 2023-07-20 MED ORDER — ROSUVASTATIN CALCIUM 40 MG PO TABS
ORAL_TABLET | ORAL | 3 refills | Status: DC
  Filled 2023-07-20: qty 65, 84d supply, fill #0
  Filled 2023-12-03: qty 65, 84d supply, fill #1
  Filled 2024-03-09: qty 65, 84d supply, fill #2

## 2023-07-20 NOTE — Telephone Encounter (Signed)
 Copied from CRM [id number]. Topic: Clinical - Medication Refill >> Jul 20, 2023 11:47 AM [place] La N wrote: Medication: rosuvastatin  (CRESTOR ) 40 MG tablet  Has the patient contacted their pharmacy? No (Agent: If no, request that the patient contact the pharmacy for the refill. If patient does not wish to contact the pharmacy document the reason why and proceed with request.) (Agent: If yes, when and what did the pharmacy advise?)  This is the patient's preferred pharmacy:  [facility] [address] [place] [address] Phone: [phone] Fax: [phone]  Is this the correct pharmacy for this prescription? Yes If no, delete pharmacy and type the correct one.   Has the prescription been filled recently? No  Is the patient out of the medication? Yes  Has the patient been seen for an appointment in the last year OR does the patient have an upcoming appointment? Yes  Can we respond through MyChart? No  Agent: Please be advised that Rx refills may take up to 3 business days. We ask that you follow-up with your pharmacy.

## 2023-07-20 NOTE — Telephone Encounter (Unsigned)
 Copied from CRM [id number]. Topic: Clinical - Medication Refill >> Jul 20, 2023 11:47 AM [place] La N wrote: Medication: rosuvastatin  (CRESTOR ) 40 MG tablet  Has the patient contacted their pharmacy? No (Agent: If no, request that the patient contact the pharmacy for the refill. If patient does not wish to contact the pharmacy document the reason why and proceed with request.) (Agent: If yes, when and what did the pharmacy advise?)  This is the patient's preferred pharmacy:  [facility] [address] [place] [address] Phone: [phone] Fax: [phone]  Is this the correct pharmacy for this prescription? Yes If no, delete pharmacy and type the correct one.   Has the prescription been filled recently? No  Is the patient out of the medication? Yes  Has the patient been seen for an appointment in the last year OR does the patient have an upcoming appointment? Yes  Can we respond through MyChart? No  Agent: Please be advised that Rx refills may take up to 3 business days. We ask that you follow-up with your pharmacy.

## 2023-07-23 ENCOUNTER — Other Ambulatory Visit (HOSPITAL_BASED_OUTPATIENT_CLINIC_OR_DEPARTMENT_OTHER): Payer: Self-pay

## 2023-07-24 ENCOUNTER — Telehealth: Payer: Self-pay

## 2023-07-24 ENCOUNTER — Other Ambulatory Visit (HOSPITAL_BASED_OUTPATIENT_CLINIC_OR_DEPARTMENT_OTHER): Payer: Self-pay

## 2023-07-24 NOTE — Telephone Encounter (Signed)
 Pharmacy Patient Advocate Encounter   Received notification from CoverMyMeds that prior authorization for Ozempic  (2 MG/DOSE) 8MG /3ML pen-injectors is required/requested.   Insurance verification completed.   The patient is insured through [employer] .   Per test claim: PA required; PA submitted to above mentioned insurance via CoverMyMeds Key/confirmation #/EOC [id number] Status is pending

## 2023-07-25 ENCOUNTER — Other Ambulatory Visit: Payer: Self-pay

## 2023-07-28 ENCOUNTER — Other Ambulatory Visit (HOSPITAL_BASED_OUTPATIENT_CLINIC_OR_DEPARTMENT_OTHER): Payer: Self-pay

## 2023-07-30 ENCOUNTER — Other Ambulatory Visit (HOSPITAL_BASED_OUTPATIENT_CLINIC_OR_DEPARTMENT_OTHER): Payer: Self-pay

## 2023-07-31 ENCOUNTER — Other Ambulatory Visit (HOSPITAL_BASED_OUTPATIENT_CLINIC_OR_DEPARTMENT_OTHER): Payer: Self-pay

## 2023-07-31 ENCOUNTER — Other Ambulatory Visit (HOSPITAL_COMMUNITY): Payer: Self-pay

## 2023-07-31 NOTE — Telephone Encounter (Signed)
 Pharmacy Patient Advocate Encounter  Received notification from [employer] that Prior Authorization for Ozempic  (2 MG/DOSE) 8MG /3ML pen-injectors  has been APPROVED from 07/24/2023 to 07/29/2024. Ran test claim, This test claim too soon last fill 07/30/2023 next fill is 08/12/2023.   This test claim was processed through [facility]- copay amounts may vary at other pharmacies due to pharmacy/plan contracts, or as the patient moves through the different stages of their insurance plan.  Message from Plan CaseId:[id number];Status:Approved;Review Type:Prior Auth;Coverage Start Date:07/24/2023;Coverage End Date:07/29/2024;. Authorization Expiration Date: Jul 29, 2024.   PA #/Case ID/Reference #: :[id number]

## 2023-07-31 NOTE — Telephone Encounter (Signed)
 Called pt via  phone left message that ozempic  was approved if any question or concerns to call office back.

## 2023-09-03 ENCOUNTER — Other Ambulatory Visit (HOSPITAL_BASED_OUTPATIENT_CLINIC_OR_DEPARTMENT_OTHER): Payer: Self-pay

## 2023-09-03 ENCOUNTER — Other Ambulatory Visit (HOSPITAL_COMMUNITY): Payer: Self-pay

## 2023-09-03 ENCOUNTER — Telehealth: Payer: Self-pay

## 2023-09-03 ENCOUNTER — Encounter: Payer: Self-pay | Admitting: Internal Medicine

## 2023-09-03 ENCOUNTER — Ambulatory Visit: Admitting: Internal Medicine

## 2023-09-03 VITALS — BP 120/72 | HR 79 | Temp 98.0°F | Ht 72.0 in | Wt 241.8 lb

## 2023-09-03 DIAGNOSIS — R451 Restlessness and agitation: Secondary | ICD-10-CM

## 2023-09-03 DIAGNOSIS — E1169 Type 2 diabetes mellitus with other specified complication: Secondary | ICD-10-CM

## 2023-09-03 DIAGNOSIS — E785 Hyperlipidemia, unspecified: Secondary | ICD-10-CM

## 2023-09-03 DIAGNOSIS — R29818 Other symptoms and signs involving the nervous system: Secondary | ICD-10-CM

## 2023-09-03 DIAGNOSIS — I10 Essential (primary) hypertension: Secondary | ICD-10-CM | POA: Diagnosis not present

## 2023-09-03 DIAGNOSIS — E1122 Type 2 diabetes mellitus with diabetic chronic kidney disease: Secondary | ICD-10-CM | POA: Diagnosis not present

## 2023-09-03 DIAGNOSIS — K760 Fatty (change of) liver, not elsewhere classified: Secondary | ICD-10-CM

## 2023-09-03 DIAGNOSIS — E559 Vitamin D deficiency, unspecified: Secondary | ICD-10-CM

## 2023-09-03 DIAGNOSIS — Z6832 Body mass index (BMI) 32.0-32.9, adult: Secondary | ICD-10-CM | POA: Diagnosis not present

## 2023-09-03 DIAGNOSIS — Z7985 Long-term (current) use of injectable non-insulin antidiabetic drugs: Secondary | ICD-10-CM

## 2023-09-03 DIAGNOSIS — E291 Testicular hypofunction: Secondary | ICD-10-CM

## 2023-09-03 DIAGNOSIS — E611 Iron deficiency: Secondary | ICD-10-CM

## 2023-09-03 DIAGNOSIS — E65 Localized adiposity: Secondary | ICD-10-CM

## 2023-09-03 DIAGNOSIS — F419 Anxiety disorder, unspecified: Secondary | ICD-10-CM

## 2023-09-03 DIAGNOSIS — N181 Chronic kidney disease, stage 1: Secondary | ICD-10-CM

## 2023-09-03 DIAGNOSIS — F32A Depression, unspecified: Secondary | ICD-10-CM

## 2023-09-03 DIAGNOSIS — T887XXA Unspecified adverse effect of drug or medicament, initial encounter: Secondary | ICD-10-CM

## 2023-09-03 DIAGNOSIS — R110 Nausea: Secondary | ICD-10-CM

## 2023-09-03 DIAGNOSIS — R7989 Other specified abnormal findings of blood chemistry: Secondary | ICD-10-CM

## 2023-09-03 LAB — LIPID PANEL
Cholesterol: 199 mg/dL (ref 0–200)
HDL: 48.6 mg/dL (ref >39.00)
LDL Cholesterol: 110 mg/dL — ABNORMAL HIGH (ref 0–99)
NonHDL: 150.82
Total CHOL/HDL Ratio: 4
Triglycerides: 203 mg/dL — ABNORMAL HIGH (ref 0.0–149.0)
VLDL: 40.6 mg/dL — ABNORMAL HIGH (ref 0.0–40.0)

## 2023-09-03 LAB — CBC WITH DIFFERENTIAL/PLATELET
Basophils Absolute: 0.1 10*3/uL (ref 0.0–0.1)
Basophils Relative: 0.7 % (ref 0.0–3.0)
Eosinophils Absolute: 0.2 10*3/uL (ref 0.0–0.7)
Eosinophils Relative: 2.6 % (ref 0.0–5.0)
HCT: 41.1 % (ref 39.0–52.0)
Hemoglobin: 13.9 g/dL (ref 13.0–17.0)
Lymphocytes Relative: 20.7 % (ref 12.0–46.0)
Lymphs Abs: 1.7 10*3/uL (ref 0.7–4.0)
MCHC: 33.8 g/dL (ref 30.0–36.0)
MCV: 88.3 fl (ref 78.0–100.0)
Monocytes Absolute: 0.6 10*3/uL (ref 0.1–1.0)
Monocytes Relative: 7 % (ref 3.0–12.0)
Neutro Abs: 5.7 10*3/uL (ref 1.4–7.7)
Neutrophils Relative %: 69 % (ref 43.0–77.0)
Platelets: 264 10*3/uL (ref 150.0–400.0)
RBC: 4.66 Mil/uL (ref 4.22–5.81)
RDW: 14.7 % (ref 11.5–15.5)
WBC: 8.2 10*3/uL (ref 4.0–10.5)

## 2023-09-03 LAB — COMPREHENSIVE METABOLIC PANEL WITH GFR
ALT: 22 U/L (ref 0–53)
AST: 20 U/L (ref 0–37)
Albumin: 4.2 g/dL (ref 3.5–5.2)
Alkaline Phosphatase: 82 U/L (ref 39–117)
BUN: 21 mg/dL (ref 6–23)
CO2: 26 meq/L (ref 19–32)
Calcium: 9.8 mg/dL (ref 8.4–10.5)
Chloride: 102 meq/L (ref 96–112)
Creatinine, Ser: 1.02 mg/dL (ref 0.40–1.50)
GFR: 83.48 mL/min (ref >60.00)
Glucose, Bld: 115 mg/dL — ABNORMAL HIGH (ref 70–99)
Potassium: 4.8 meq/L (ref 3.5–5.1)
Sodium: 138 meq/L (ref 135–145)
Total Bilirubin: 0.4 mg/dL (ref 0.2–1.2)
Total Protein: 7 g/dL (ref 6.0–8.3)

## 2023-09-03 LAB — VITAMIN D 25 HYDROXY (VIT D DEFICIENCY, FRACTURES): VITD: 41.28 ng/mL (ref 30.00–100.00)

## 2023-09-03 LAB — CORTISOL: Cortisol, Plasma: 13.8 ug/dL

## 2023-09-03 LAB — FERRITIN: Ferritin: 41.4 ng/mL (ref 22.0–322.0)

## 2023-09-03 LAB — TESTOSTERONE: Testosterone: 227.7 ng/dL — ABNORMAL LOW (ref 300.00–890.00)

## 2023-09-03 MED ORDER — ONDANSETRON 4 MG PO TBDP
4.0000 mg | ORAL_TABLET | Freq: Three times a day (TID) | ORAL | 4 refills | Status: DC | PRN
Start: 2023-09-03 — End: 2023-12-10
  Filled 2023-09-03: qty 60, 20d supply, fill #0

## 2023-09-03 MED ORDER — TIRZEPATIDE 2.5 MG/0.5ML ~~LOC~~ SOAJ
2.5000 mg | SUBCUTANEOUS | 11 refills | Status: DC
  Filled 2023-09-03: qty 2, 28d supply, fill #0

## 2023-09-03 MED ORDER — ESCITALOPRAM OXALATE 20 MG PO TABS: ORAL_TABLET | ORAL | 3 refills | Status: DC

## 2023-09-03 NOTE — Assessment & Plan Note (Signed)
 The patient's central adiposity is being addressed through lifestyle modifications and medication adjustments. Encouragement of physical activity and dietary management continues to be a focus to mitigate associated cardiovascular risks.

## 2023-09-03 NOTE — Assessment & Plan Note (Signed)
 Given the patient's borderline elevations in triglycerides (203 mg/dL) and LDL cholesterol (889 mg/dL), continued monitoring and management are essential. The lipid panel will be reassessed during lab work to guide treatment adjustments as necessary.

## 2023-09-03 NOTE — Patient Instructions (Addendum)
 The expert Abby Moeller https://www.linkedin.com/posts/dr-kenny-harless-33b74714_are-you-struggling-with-insulin -and-[email or web address]-activity-7272694439184109568-UxJP   The product [employer] https://shop2.unicity.com/usa /en/product/unimate?sku=35755  VISIT SUMMARY:  During your visit, we discussed your type 2 diabetes, mild kidney disease, and other health concerns. We reviewed your current medications and addressed the side effects you are experiencing. We also talked about your blood pressure, mood, and general health maintenance.  YOUR PLAN:  -TYPE 2 DIABETES MELLITUS WITH STAGE 1 CHRONIC KIDNEY DISEASE: Your blood sugar levels are well-controlled, but you are experiencing significant nausea and occasional vomiting from Ozempic . We will switch you to Mounjaro , which may help with weight loss and have fewer side effects. We will also prescribe Zofran to manage your nausea. Blood work will be done to check your kidney function and cholesterol levels.  -FATTY LIVER: You have fatty liver disease, and we will perform a fib-4 reflex test to assess its severity. Avoiding alcohol is important to protect your liver health.  -HYPERTENSION: Your blood pressure is well-controlled with your current medication. You have stopped taking bisoprolol , and your blood pressure remains stable. Continue with your current medication regimen.  -DEPRESSION: You are taking escitalopram  (Lexapro ) for mood stabilization, and you are currently at the maximum dose. You mentioned that your stress levels are improving. We will prescribe a year's supply of escitalopram .  -VITAMIN D  DEFICIENCY: You are taking vitamin D  supplements regularly. We will check your vitamin D  levels during your upcoming lab work.  -IRON DEFICIENCY: You have a history of iron deficiency, and we will check your iron levels during your upcoming lab work.  -GENERAL HEALTH MAINTENANCE: You are engaging in physical activities like gardening and golf, which  are beneficial for your health. Continue these activities and be cautious to prevent back injury. Avoid alcohol to protect your liver health.  INSTRUCTIONS:  We will follow up on your lab results and adjust your treatment as necessary. Consider using [facility] for medication delivery to reduce costs.

## 2023-09-03 NOTE — Telephone Encounter (Signed)
 Pharmacy Patient Advocate Encounter   Received notification from Onbase that prior authorization for MOUNJARO  2.5ML/0.5ML is required/requested.   Insurance verification completed.   The patient is insured through [employer] .   Per test claim: PA required; PA submitted to above mentioned insurance via CoverMyMeds Key/confirmation #/EOC [id number] Status is pending

## 2023-09-03 NOTE — Assessment & Plan Note (Signed)
 The patient engages in physical activities such as gardening and golf, which are beneficial for health and weight management. He does not consume alcohol, which is positive for liver health. Encouraged to continue physical activity and advised caution to prevent back injury. The transition to Mounjaro  is expected to further assist in weight management, given the recent weight fluctuations and stress-related gains.

## 2023-09-03 NOTE — Assessment & Plan Note (Signed)
 The patient experiences significant nausea and occasional vomiting, likely due to Ozempic . To manage these side effects, Zofran has been prescribed. The switch to Mounjaro  is anticipated to alleviate these symptoms while maintaining glycemic control.

## 2023-09-03 NOTE — Assessment & Plan Note (Signed)
 Blood pressure is well-controlled with the current medication regimen, with readings between 120/72 and 130/90 mmHg. He has discontinued bisoprolol  as per previous advice, and his blood pressure remains stable without it. Continue the current antihypertensive regimen without bisoprolol .

## 2023-09-03 NOTE — Assessment & Plan Note (Signed)
 The patient is on vitamin D  supplementation and adheres to the regimen, with current levels at 41.3 ng/mL. Plans to check vitamin D  levels during the upcoming lab work to ensure adequacy of supplementation.

## 2023-09-03 NOTE — Assessment & Plan Note (Signed)
 The patient's blood glucose levels are well-controlled with Ozempic , evidenced by an HbA1c consistently around 6.0-6.3%. However, he experiences significant nausea and occasional vomiting, likely due to Ozempic , with two episodes of vomiting in the past week. To improve weight loss and glycemic control while reducing side effects, a switch to Mounjaro  is suggested, though insurance coverage may pose a challenge. Despite the gastrointestinal issues, the A1c is not a concern at this time due to good control with current medication. Initiate the process to switch from Ozempic  to Mounjaro  for better tolerance and efficacy. Prescribe Zofran for nausea management and perform blood work to assess kidney function and cholesterol levels.

## 2023-09-03 NOTE — Progress Notes (Signed)
 ==============================  [facility]: [phone]   -- Medical Office Visit --  Patient: Charles Harrell      Age: 53 y.o.       Sex:  male  Date:   09/03/2023 Today's Healthcare Provider: Bernardino KANDICE Cone, MD  ==============================   Chief Complaint: Diabetes (Pt does keep his blood sugars at at 130 and lower. Ozempic  makes stomach issues a lot.)   Discussed the use of AI scribe software for clinical note transcription with the patient, who gave verbal consent to proceed.  History of Present Illness 53 year old male with type 2 diabetes and mild kidney disease who presents for a three-month follow-up.  He is managing type 2 diabetes with Ozempic  and metformin , 1000 mg twice a day. Blood sugar levels have been at 130 mg/dL or lower. However, he experiences significant nausea and occasional vomiting due to Ozempic , with nausea occurring almost daily and two episodes of vomiting in the past week. He manages nausea by drinking something quickly to settle his stomach. He recently refilled his Ozempic  prescription.  He is also taking escitalopram  for mood, which he is running low on. Stress levels have been high due to personal issues, but he feels it is affecting him less now. He is taking a new blood pressure medication and feels it works a little better.  He has experienced weight fluctuations, having lost weight but gained some back during a stressful month. He is currently at 240 pounds, having previously reached 230 pounds. He attributes some weight gain to stress and personal issues. He does not consume alcohol and engages in physical activities like gardening and golf.  He reports a history of sleep issues but states he is sleeping well now. He has not had a sleep study. He mentions a past vitamin D  deficiency and iron deficiency, for which he has been taking supplements. He also has a history of fatty liver disease.  He works as a  Insurance account manager deliveries at night. He mentions that he has been flossing regularly and has no dental pain, although he suspects he might have a cavity.  Lab Results  Component Value Date   HGBA1C 6.0 06/28/2023   HGBA1C 6.3 (H) 03/26/2023   HGBA1C 6.2 (H) 12/25/2022    Wt Readings from Last 10 Encounters:  09/03/23 241 lb 12.8 oz (109.7 kg)  05/31/23 242 lb 12.8 oz (110.1 kg)  03/26/23 255 lb 9.6 oz (115.9 kg)  12/25/22 256 lb 9.6 oz (116.4 kg)  11/07/22 260 lb (117.9 kg)  09/01/22 258 lb (117 kg)  05/03/22 265 lb (120.2 kg)  01/18/22 271 lb (122.9 kg)  10/17/21 283 lb 3.2 oz (128.5 kg)  07/12/21 287 lb 12.8 oz (130.5 kg)     BP Readings from Last 30 Encounters:  09/03/23 120/72  05/31/23 (!) 130/90  03/26/23 118/70  12/25/22 118/78  11/07/22 (!) 150/80  09/01/22 118/78  05/03/22 122/88  01/18/22 126/78  10/17/21 130/88  07/12/21 122/80  03/24/21 124/80  10/11/20 126/74  06/28/20 118/74  03/24/20 126/90  03/17/20 (!) 140/92  02/02/20 136/89  12/18/19 120/82  09/30/19 126/82  08/28/19 (!) 142/98  05/28/19 120/86  02/25/19 131/89  12/17/18 110/70  12/03/18 116/70  11/26/18 (!) 142/90  08/20/18 129/88  05/24/18 (!) 150/96  03/19/18 125/70  02/06/18 (!) 162/94  09/17/17 (!) 152/96  02/23/17 (!) 174/110    Background Reviewed: Problem List: has Essential hypertension; Hyperlipidemia associated with type 2 diabetes mellitus (  HCC); Type 2 diabetes mellitus (HCC); Vitamin D  deficiency; Morbid obesity (HCC); 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 Past Medical History:  has a past medical history of Depression, Diabetes mellitus without complication (HCC), Hyperlipidemia, Hypertension, Morbid obesity (HCC), Plantar fasciitis, and Vitamin D  deficiency. Past Surgical  History:   has a past surgical history that includes Root canal (2021). Social History:   reports that he quit smoking about 12 years ago. His smoking use included cigarettes. He has never used smokeless tobacco. He reports that he does not currently use drugs after having used the following drugs: Marijuana. He reports that he does not drink alcohol. Family History:  family history includes COPD in his father; Diabetes in his father; Early death in his father; Heart disease in his father; Hyperlipidemia in his father and mother; Hypertension in his father and mother; Lung cancer in his mother. Allergies:  is allergic to poison ivy extract.   Medication Reconciliation: Current Outpatient Medications on File Prior to Visit  Medication Sig   aspirin 81 MG chewable tablet Chew by mouth daily.   losartan  (COZAAR ) 25 MG tablet Take 1 tablet (25 mg total) by mouth daily.   metFORMIN  (GLUCOPHAGE -XR) 500 MG 24 hr tablet Take  2 tablets 2 x/ day with Meals for Diabetes                                              /                                                    TAKE                                                   BY                                                    MOUTH   rosuvastatin  (CRESTOR ) 40 MG tablet Take 1 tablet by mouth 5 times a  week for Cholesterol   TURMERIC PO Take by mouth daily.   Cholecalciferol  (VITAMIN D3) 125 MCG (5000 UT) CAPS Take 1 capsule (5,000 Units total) by mouth daily.   Semaglutide , 2 MG/DOSE, 8 MG/3ML SOPN Inject 2 mg into the skin once a week.   No current facility-administered medications on file prior to visit.   Medications Discontinued During This Encounter  Medication Reason   phentermine  (ADIPEX-P ) 37.5 MG tablet Completed Course   escitalopram  (LEXAPRO ) 20 MG tablet Reorder   Accu-Chek Softclix Lancets lancets Completed Course   Blood Glucose Monitoring Suppl DEVI Completed Course   glucose blood test strip Completed Course   Accu-Chek Softclix Lancets  lancets Completed Course   Semaglutide , 2 MG/DOSE, (OZEMPIC , 2 MG/DOSE,) 8 MG/3ML SOPN Completed Course   phentermine  (  ADIPEX-P ) 37.5 MG tablet Completed Course   topiramate  (TOPAMAX ) 100 MG tablet Completed Course   dexamethasone  (DECADRON ) 2 MG tablet Completed Course   bisoprolol -hydrochlorothiazide  (ZIAC ) 10-6.25 MG tablet Completed Course     Physical Exam:    09/03/2023    8:38 AM 05/31/2023    8:44 AM 05/31/2023    8:14 AM  Vitals with BMI  Height 6' 0  6' 0  Weight 241 lbs 13 oz  242 lbs 13 oz  BMI 32.79  32.92  Systolic 120 130 871  Diastolic 72 90 98  Pulse 79  84  Vital signs reviewed.  Nursing notes reviewed. Weight trend reviewed. Physical Exam General Appearance:  No acute distress appreciable.   Well-groomed, healthy-appearing male.  Well proportioned with no abnormal fat distribution.  Good muscle tone. Pulmonary:  Normal work of breathing at rest, no respiratory distress apparent. SpO2: 98 %  Musculoskeletal: All extremities are intact.  Neurological:  Awake, alert, oriented, and engaged.  No obvious focal neurological deficits or cognitive impairments.  Sensorium seems unclouded.   Speech is clear and coherent with logical content. Psychiatric:  Appropriate mood, pleasant and cooperative demeanor, thoughtful and engaged during the exam Physical Exam    Results:    09/03/2023    8:51 AM 05/31/2023    8:48 AM 03/25/2023   11:45 PM 11/12/2022   10:32 PM  PHQ 2/9 Scores  PHQ - 2 Score 2 0 0 0  PHQ- 9 Score 8      Results LABS HbA1c: unspecified (06/2023) Platelets: decreased  DIAGNOSTIC Eye exam: improved compared to previous exam (2025)    Results for orders placed or performed in visit on 09/03/23  CBC with Differential/Platelet  Result Value Ref Range   WBC 8.2 4.0 - 10.5 K/uL   RBC 4.66 4.22 - 5.81 Mil/uL   Hemoglobin 13.9 13.0 - 17.0 g/dL   HCT 58.8 60.9 - 47.9 %   MCV 88.3 78.0 - 100.0 fl   MCHC 33.8 30.0 - 36.0 g/dL   RDW 85.2 88.4 - 84.4 %    Platelets 264.0 150.0 - 400.0 K/uL   Neutrophils Relative % 69.0 43.0 - 77.0 %   Lymphocytes Relative 20.7 12.0 - 46.0 %   Monocytes Relative 7.0 3.0 - 12.0 %   Eosinophils Relative 2.6 0.0 - 5.0 %   Basophils Relative 0.7 0.0 - 3.0 %   Neutro Abs 5.7 1.4 - 7.7 K/uL   Lymphs Abs 1.7 0.7 - 4.0 K/uL   Monocytes Absolute 0.6 0.1 - 1.0 K/uL   Eosinophils Absolute 0.2 0.0 - 0.7 K/uL   Basophils Absolute 0.1 0.0 - 0.1 K/uL  Comprehensive metabolic panel with GFR  Result Value Ref Range   Sodium 138 135 - 145 mEq/L   Potassium 4.8 3.5 - 5.1 mEq/L   Chloride 102 96 - 112 mEq/L   CO2 26 19 - 32 mEq/L   Glucose, Bld 115 (H) 70 - 99 mg/dL   BUN 21 6 - 23 mg/dL   Creatinine, Ser 8.97 0.40 - 1.50 mg/dL   Total Bilirubin 0.4 0.2 - 1.2 mg/dL   Alkaline Phosphatase 82 39 - 117 U/L   AST 20 0 - 37 U/L   ALT 22 0 - 53 U/L   Total Protein 7.0 6.0 - 8.3 g/dL   Albumin 4.2 3.5 - 5.2 g/dL   GFR 16.51 >39.99 mL/min   Calcium  9.8 8.4 - 10.5 mg/dL  Lipid panel  Result Value Ref Range   Cholesterol  199 0 - 200 mg/dL   Triglycerides 796.9 (H) 0.0 - 149.0 mg/dL   HDL 51.39 >60.99 mg/dL   VLDL 59.3 (H) 0.0 - 59.9 mg/dL   LDL Cholesterol 889 (H) 0 - 99 mg/dL   Total CHOL/HDL Ratio 4    NonHDL 150.82   Cortisol  Result Value Ref Range   Cortisol, Plasma 13.8 ug/dL  Testosterone   Result Value Ref Range   Testosterone  227.70 (L) 300.00 - 890.00 ng/dL  Vitamin D  (25 hydroxy)  Result Value Ref Range   VITD 41.28 30.00 - 100.00 ng/mL  Ferritin  Result Value Ref Range   Ferritin 41.4 22.0 - 322.0 ng/mL   Office Visit on 09/03/2023  Component Date Value Ref Range Status   WBC 09/03/2023 8.2  4.0 - 10.5 K/uL Final   RBC 09/03/2023 4.66  4.22 - 5.81 Mil/uL Final   Hemoglobin 09/03/2023 13.9  13.0 - 17.0 g/dL Final   HCT 93/76/7974 41.1  39.0 - 52.0 % Final   MCV 09/03/2023 88.3  78.0 - 100.0 fl Final   MCHC 09/03/2023 33.8  30.0 - 36.0 g/dL Final   RDW 93/76/7974 14.7  11.5 - 15.5 % Final    Platelets 09/03/2023 264.0  150.0 - 400.0 K/uL Final   Neutrophils Relative % 09/03/2023 69.0  43.0 - 77.0 % Final   Lymphocytes Relative 09/03/2023 20.7  12.0 - 46.0 % Final   Monocytes Relative 09/03/2023 7.0  3.0 - 12.0 % Final   Eosinophils Relative 09/03/2023 2.6  0.0 - 5.0 % Final   Basophils Relative 09/03/2023 0.7  0.0 - 3.0 % Final   Neutro Abs 09/03/2023 5.7  1.4 - 7.7 K/uL Final   Lymphs Abs 09/03/2023 1.7  0.7 - 4.0 K/uL Final   Monocytes Absolute 09/03/2023 0.6  0.1 - 1.0 K/uL Final   Eosinophils Absolute 09/03/2023 0.2  0.0 - 0.7 K/uL Final   Basophils Absolute 09/03/2023 0.1  0.0 - 0.1 K/uL Final   Sodium 09/03/2023 138  135 - 145 mEq/L Final   Potassium 09/03/2023 4.8  3.5 - 5.1 mEq/L Final   Chloride 09/03/2023 102  96 - 112 mEq/L Final   CO2 09/03/2023 26  19 - 32 mEq/L Final   Glucose, Bld 09/03/2023 115 (H)  70 - 99 mg/dL Final   BUN 93/76/7974 21  6 - 23 mg/dL Final   Creatinine, Ser 09/03/2023 1.02  0.40 - 1.50 mg/dL Final   Total Bilirubin 09/03/2023 0.4  0.2 - 1.2 mg/dL Final   Alkaline Phosphatase 09/03/2023 82  39 - 117 U/L Final   AST 09/03/2023 20  0 - 37 U/L Final   ALT 09/03/2023 22  0 - 53 U/L Final   Total Protein 09/03/2023 7.0  6.0 - 8.3 g/dL Final   Albumin 93/76/7974 4.2  3.5 - 5.2 g/dL Final   GFR 93/76/7974 83.48  >60.00 mL/min Final   Calcium  09/03/2023 9.8  8.4 - 10.5 mg/dL Final   Cholesterol 93/76/7974 199  0 - 200 mg/dL Final   Triglycerides 93/76/7974 203.0 (H)  0.0 - 149.0 mg/dL Final   HDL 93/76/7974 48.60  >39.00 mg/dL Final   VLDL 93/76/7974 40.6 (H)  0.0 - 40.0 mg/dL Final   LDL Cholesterol 09/03/2023 110 (H)  0 - 99 mg/dL Final   Total CHOL/HDL Ratio 09/03/2023 4   Final   NonHDL 09/03/2023 150.82   Final   Cortisol, Plasma 09/03/2023 13.8  ug/dL Final   Testosterone  09/03/2023 227.70 (L)  300.00 - 890.00 ng/dL  Final   VITD 09/03/2023 41.28  30.00 - 100.00 ng/mL Final   Ferritin 09/03/2023 41.4  22.0 - 322.0 ng/mL Final  Lab on  06/28/2023  Component Date Value Ref Range Status   Insulin  06/28/2023 18.8 (H)  uIU/mL Final   Cholesterol 06/28/2023 148  0 - 200 mg/dL Final   Triglycerides 95/82/7974 136.0  0.0 - 149.0 mg/dL Final   HDL 95/82/7974 49.50  >39.00 mg/dL Final   VLDL 95/82/7974 27.2  0.0 - 40.0 mg/dL Final   LDL Cholesterol 06/28/2023 72  0 - 99 mg/dL Final   Total CHOL/HDL Ratio 06/28/2023 3   Final   NonHDL 06/28/2023 98.73   Final   Iron 06/28/2023 59  42 - 165 ug/dL Final   Transferrin 95/82/7974 269.0  212.0 - 360.0 mg/dL Final   Saturation Ratios 06/28/2023 15.7 (L)  20.0 - 50.0 % Final   Ferritin 06/28/2023 62.8  22.0 - 322.0 ng/mL Final   TIBC 06/28/2023 376.6  250.0 - 450.0 mcg/dL Final   Hgb J8r MFr Bld 06/28/2023 6.0  4.6 - 6.5 % Final   Sodium 06/28/2023 139  135 - 145 mEq/L Final   Potassium 06/28/2023 5.1  3.5 - 5.1 mEq/L Final   Chloride 06/28/2023 101  96 - 112 mEq/L Final   CO2 06/28/2023 30  19 - 32 mEq/L Final   Glucose, Bld 06/28/2023 123 (H)  70 - 99 mg/dL Final   BUN 95/82/7974 11  6 - 23 mg/dL Final   Creatinine, Ser 06/28/2023 0.98  0.40 - 1.50 mg/dL Final   Total Bilirubin 06/28/2023 0.6  0.2 - 1.2 mg/dL Final   Alkaline Phosphatase 06/28/2023 84  39 - 117 U/L Final   AST 06/28/2023 19  0 - 37 U/L Final   ALT 06/28/2023 20  0 - 53 U/L Final   Total Protein 06/28/2023 7.2  6.0 - 8.3 g/dL Final   Albumin 95/82/7974 4.5  3.5 - 5.2 g/dL Final   GFR 95/82/7974 87.70  >60.00 mL/min Final   Calcium  06/28/2023 10.0  8.4 - 10.5 mg/dL Final   WBC 95/82/7974 10.5  4.0 - 10.5 K/uL Final   RBC 06/28/2023 4.74  4.22 - 5.81 Mil/uL Final   Hemoglobin 06/28/2023 14.5  13.0 - 17.0 g/dL Final   HCT 95/82/7974 42.6  39.0 - 52.0 % Final   MCV 06/28/2023 89.8  78.0 - 100.0 fl Final   MCHC 06/28/2023 33.9  30.0 - 36.0 g/dL Final   RDW 95/82/7974 14.8  11.5 - 15.5 % Final   Platelets 06/28/2023 286.0  150.0 - 400.0 K/uL Final   Neutrophils Relative % 06/28/2023 65.8  43.0 - 77.0 % Final    Lymphocytes Relative 06/28/2023 23.2  12.0 - 46.0 % Final   Monocytes Relative 06/28/2023 7.2  3.0 - 12.0 % Final   Eosinophils Relative 06/28/2023 3.3  0.0 - 5.0 % Final   Basophils Relative 06/28/2023 0.5  0.0 - 3.0 % Final   Neutro Abs 06/28/2023 6.9  1.4 - 7.7 K/uL Final   Lymphs Abs 06/28/2023 2.4  0.7 - 4.0 K/uL Final   Monocytes Absolute 06/28/2023 0.8  0.1 - 1.0 K/uL Final   Eosinophils Absolute 06/28/2023 0.3  0.0 - 0.7 K/uL Final   Basophils Absolute 06/28/2023 0.1  0.0 - 0.1 K/uL Final   Testosterone  06/28/2023 221.04 (L)  300.00 - 890.00 ng/dL Final  Office Visit on 03/26/2023  Component Date Value Ref Range Status   WBC 03/26/2023 11.4 (H)  3.8 - 10.8  Thousand/uL Final   RBC 03/26/2023 4.94  4.20 - 5.80 Million/uL Final   Hemoglobin 03/26/2023 14.3  13.2 - 17.1 g/dL Final   HCT 98/86/7974 43.7  38.5 - 50.0 % Final   MCV 03/26/2023 88.5  80.0 - 100.0 fL Final   MCH 03/26/2023 28.9  27.0 - 33.0 pg Final   MCHC 03/26/2023 32.7  32.0 - 36.0 g/dL Final   RDW 98/86/7974 14.2  11.0 - 15.0 % Final   Platelets 03/26/2023 264  140 - 400 Thousand/uL Final   MPV 03/26/2023 11.1  7.5 - 12.5 fL Final   Neutro Abs 03/26/2023 8,322 (H)  1,500 - 7,800 cells/uL Final   Absolute Lymphocytes 03/26/2023 1,767  850 - 3,900 cells/uL Final   Absolute Monocytes 03/26/2023 1,094 (H)  200 - 950 cells/uL Final   Eosinophils Absolute 03/26/2023 160  15 - 500 cells/uL Final   Basophils Absolute 03/26/2023 57  0 - 200 cells/uL Final   Neutrophils Relative % 03/26/2023 73  % Final   Total Lymphocyte 03/26/2023 15.5  % Final   Monocytes Relative 03/26/2023 9.6  % Final   Eosinophils Relative 03/26/2023 1.4  % Final   Basophils Relative 03/26/2023 0.5  % Final   Glucose, Bld 03/26/2023 123 (H)  65 - 99 mg/dL Final   BUN 98/86/7974 16  7 - 25 mg/dL Final   Creat 98/86/7974 0.99  0.70 - 1.30 mg/dL Final   eGFR 98/86/7974 91  > OR = 60 mL/min/1.108m2 Final   BUN/Creatinine Ratio 03/26/2023 SEE NOTE:  6 -  22 (calc) Final   Sodium 03/26/2023 138  135 - 146 mmol/L Final   Potassium 03/26/2023 4.8  3.5 - 5.3 mmol/L Final   Chloride 03/26/2023 103  98 - 110 mmol/L Final   CO2 03/26/2023 26  20 - 32 mmol/L Final   Calcium  03/26/2023 9.6  8.6 - 10.3 mg/dL Final   Total Protein 98/86/7974 6.9  6.1 - 8.1 g/dL Final   Albumin 98/86/7974 4.0  3.6 - 5.1 g/dL Final   Globulin 98/86/7974 2.9  1.9 - 3.7 g/dL (calc) Final   AG Ratio 03/26/2023 1.4  1.0 - 2.5 (calc) Final   Total Bilirubin 03/26/2023 0.4  0.2 - 1.2 mg/dL Final   Alkaline phosphatase (APISO) 03/26/2023 79  35 - 144 U/L Final   AST 03/26/2023 21  10 - 35 U/L Final   ALT 03/26/2023 28  9 - 46 U/L Final   Magnesium 03/26/2023 1.7  1.5 - 2.5 mg/dL Final   Cholesterol 98/86/7974 178  <200 mg/dL Final   HDL 98/86/7974 53  > OR = 40 mg/dL Final   Triglycerides 98/86/7974 188 (H)  <150 mg/dL Final   LDL Cholesterol (Calc) 03/26/2023 97  mg/dL (calc) Final   Total CHOL/HDL Ratio 03/26/2023 3.4  <4.9 (calc) Final   Non-HDL Cholesterol (Calc) 03/26/2023 125  <130 mg/dL (calc) Final   TSH 98/86/7974 1.06  0.40 - 4.50 mIU/L Final   Hgb A1c MFr Bld 03/26/2023 6.3 (H)  <5.7 % of total Hgb Final   Mean Plasma Glucose 03/26/2023 134  mg/dL Final   eAG (mmol/L) 98/86/7974 7.4  mmol/L Final   Insulin  03/26/2023 89.3 (H)  uIU/mL Final   Vit D, 25-Hydroxy 03/26/2023 17 (L)  30 - 100 ng/mL Final  Office Visit on 12/25/2022  Component Date Value Ref Range Status   WBC 12/25/2022 10.1  3.8 - 10.8 Thousand/uL Final   RBC 12/25/2022 4.61  4.20 - 5.80 Million/uL Final  Hemoglobin 12/25/2022 13.4  13.2 - 17.1 g/dL Final   HCT 89/85/7975 41.2  38.5 - 50.0 % Final   MCV 12/25/2022 89.4  80.0 - 100.0 fL Final   MCH 12/25/2022 29.1  27.0 - 33.0 pg Final   MCHC 12/25/2022 32.5  32.0 - 36.0 g/dL Final   RDW 89/85/7975 14.4  11.0 - 15.0 % Final   Platelets 12/25/2022 280  140 - 400 Thousand/uL Final   MPV 12/25/2022 10.8  7.5 - 12.5 fL Final   Neutro Abs  12/25/2022 6,343  1,500 - 7,800 cells/uL Final   Lymphs Abs 12/25/2022 2,656  850 - 3,900 cells/uL Final   Absolute Monocytes 12/25/2022 798  200 - 950 cells/uL Final   Eosinophils Absolute 12/25/2022 253  15 - 500 cells/uL Final   Basophils Absolute 12/25/2022 51  0 - 200 cells/uL Final   Neutrophils Relative % 12/25/2022 62.8  % Final   Total Lymphocyte 12/25/2022 26.3  % Final   Monocytes Relative 12/25/2022 7.9  % Final   Eosinophils Relative 12/25/2022 2.5  % Final   Basophils Relative 12/25/2022 0.5  % Final   Glucose, Bld 12/25/2022 110 (H)  65 - 99 mg/dL Final   BUN 89/85/7975 24  7 - 25 mg/dL Final   Creat 89/85/7975 1.17  0.70 - 1.30 mg/dL Final   eGFR 89/85/7975 75  > OR = 60 mL/min/1.58m2 Final   BUN/Creatinine Ratio 12/25/2022 SEE NOTE:  6 - 22 (calc) Final   Sodium 12/25/2022 141  135 - 146 mmol/L Final   Potassium 12/25/2022 4.6  3.5 - 5.3 mmol/L Final   Chloride 12/25/2022 105  98 - 110 mmol/L Final   CO2 12/25/2022 26  20 - 32 mmol/L Final   Calcium  12/25/2022 9.8  8.6 - 10.3 mg/dL Final   Total Protein 89/85/7975 7.0  6.1 - 8.1 g/dL Final   Albumin 89/85/7975 4.1  3.6 - 5.1 g/dL Final   Globulin 89/85/7975 2.9  1.9 - 3.7 g/dL (calc) Final   AG Ratio 12/25/2022 1.4  1.0 - 2.5 (calc) Final   Total Bilirubin 12/25/2022 0.4  0.2 - 1.2 mg/dL Final   Alkaline phosphatase (APISO) 12/25/2022 86  35 - 144 U/L Final   AST 12/25/2022 18  10 - 35 U/L Final   ALT 12/25/2022 24  9 - 46 U/L Final   Cholesterol 12/25/2022 145  <200 mg/dL Final   HDL 89/85/7975 48  > OR = 40 mg/dL Final   Triglycerides 89/85/7975 215 (H)  <150 mg/dL Final   LDL Cholesterol (Calc) 12/25/2022 69  mg/dL (calc) Final   Total CHOL/HDL Ratio 12/25/2022 3.0  <4.9 (calc) Final   Non-HDL Cholesterol (Calc) 12/25/2022 97  <130 mg/dL (calc) Final   Hgb J8r MFr Bld 12/25/2022 6.2 (H)  <5.7 % of total Hgb Final   Mean Plasma Glucose 12/25/2022 131  mg/dL Final   eAG (mmol/L) 89/85/7975 7.3  mmol/L Final   Office Visit on 09/01/2022  Component Date Value Ref Range Status   Iron 09/01/2022 59  50 - 180 mcg/dL Final   TIBC 93/78/7975 330  250 - 425 mcg/dL (calc) Final   %SAT 93/78/7975 18 (L)  20 - 48 % (calc) Final   Vitamin B-12 09/01/2022 319  200 - 1,100 pg/mL Final   PSA 09/01/2022 0.65  < OR = 4.00 ng/mL Final   Testosterone  09/01/2022 273  250 - 827 ng/dL Final   WBC 93/78/7975 10.3  3.8 - 10.8 Thousand/uL Final   RBC  09/01/2022 4.64  4.20 - 5.80 Million/uL Final   Hemoglobin 09/01/2022 13.4  13.2 - 17.1 g/dL Final   HCT 93/78/7975 41.0  38.5 - 50.0 % Final   MCV 09/01/2022 88.4  80.0 - 100.0 fL Final   MCH 09/01/2022 28.9  27.0 - 33.0 pg Final   MCHC 09/01/2022 32.7  32.0 - 36.0 g/dL Final   RDW 93/78/7975 14.9  11.0 - 15.0 % Final   Platelets 09/01/2022 268  140 - 400 Thousand/uL Final   MPV 09/01/2022 10.7  7.5 - 12.5 fL Final   Neutro Abs 09/01/2022 7,200  1,500 - 7,800 cells/uL Final   Lymphs Abs 09/01/2022 1,967  850 - 3,900 cells/uL Final   Absolute Monocytes 09/01/2022 783  200 - 950 cells/uL Final   Eosinophils Absolute 09/01/2022 299  15 - 500 cells/uL Final   Basophils Absolute 09/01/2022 52  0 - 200 cells/uL Final   Neutrophils Relative % 09/01/2022 69.9  % Final   Total Lymphocyte 09/01/2022 19.1  % Final   Monocytes Relative 09/01/2022 7.6  % Final   Eosinophils Relative 09/01/2022 2.9  % Final   Basophils Relative 09/01/2022 0.5  % Final   Glucose, Bld 09/01/2022 120 (H)  65 - 99 mg/dL Final   BUN 93/78/7975 15  7 - 25 mg/dL Final   Creat 93/78/7975 1.01  0.70 - 1.30 mg/dL Final   eGFR 93/78/7975 89  > OR = 60 mL/min/1.65m2 Final   BUN/Creatinine Ratio 09/01/2022 SEE NOTE:  6 - 22 (calc) Final   Sodium 09/01/2022 139  135 - 146 mmol/L Final   Potassium 09/01/2022 4.5  3.5 - 5.3 mmol/L Final   Chloride 09/01/2022 105  98 - 110 mmol/L Final   CO2 09/01/2022 26  20 - 32 mmol/L Final   Calcium  09/01/2022 9.6  8.6 - 10.3 mg/dL Final   Total Protein 93/78/7975 7.1   6.1 - 8.1 g/dL Final   Albumin 93/78/7975 4.2  3.6 - 5.1 g/dL Final   Globulin 93/78/7975 2.9  1.9 - 3.7 g/dL (calc) Final   AG Ratio 09/01/2022 1.4  1.0 - 2.5 (calc) Final   Total Bilirubin 09/01/2022 0.5  0.2 - 1.2 mg/dL Final   Alkaline phosphatase (APISO) 09/01/2022 89  35 - 144 U/L Final   AST 09/01/2022 14  10 - 35 U/L Final   ALT 09/01/2022 21  9 - 46 U/L Final   Magnesium 09/01/2022 1.7  1.5 - 2.5 mg/dL Final   Cholesterol 93/78/7975 167  <200 mg/dL Final   HDL 93/78/7975 48  > OR = 40 mg/dL Final   Triglycerides 93/78/7975 208 (H)  <150 mg/dL Final   LDL Cholesterol (Calc) 09/01/2022 89  mg/dL (calc) Final   Total CHOL/HDL Ratio 09/01/2022 3.5  <4.9 (calc) Final   Non-HDL Cholesterol (Calc) 09/01/2022 119  <130 mg/dL (calc) Final   TSH 93/78/7975 2.00  0.40 - 4.50 mIU/L Final   Hgb A1c MFr Bld 09/01/2022 6.1 (H)  <5.7 % of total Hgb Final   Mean Plasma Glucose 09/01/2022 128  mg/dL Final   eAG (mmol/L) 93/78/7975 7.1  mmol/L Final   Vit D, 25-Hydroxy 09/01/2022 33  30 - 100 ng/mL Final  Office Visit on 05/03/2022  Component Date Value Ref Range Status   WBC 05/03/2022 8.6  3.8 - 10.8 Thousand/uL Final   RBC 05/03/2022 4.86  4.20 - 5.80 Million/uL Final   Hemoglobin 05/03/2022 13.9  13.2 - 17.1 g/dL Final   HCT 97/78/7975 40.8  38.5 -  50.0 % Final   MCV 05/03/2022 84.0  80.0 - 100.0 fL Final   MCH 05/03/2022 28.6  27.0 - 33.0 pg Final   MCHC 05/03/2022 34.1  32.0 - 36.0 g/dL Final   RDW 97/78/7975 14.9  11.0 - 15.0 % Final   Platelets 05/03/2022 257  140 - 400 Thousand/uL Final   MPV 05/03/2022 10.5  7.5 - 12.5 fL Final   Neutro Abs 05/03/2022 5,822  1,500 - 7,800 cells/uL Final   Lymphs Abs 05/03/2022 1,849  850 - 3,900 cells/uL Final   Absolute Monocytes 05/03/2022 654  200 - 950 cells/uL Final   Eosinophils Absolute 05/03/2022 232  15 - 500 cells/uL Final   Basophils Absolute 05/03/2022 43  0 - 200 cells/uL Final   Neutrophils Relative % 05/03/2022 67.7  % Final    Total Lymphocyte 05/03/2022 21.5  % Final   Monocytes Relative 05/03/2022 7.6  % Final   Eosinophils Relative 05/03/2022 2.7  % Final   Basophils Relative 05/03/2022 0.5  % Final   Glucose, Bld 05/03/2022 89  65 - 99 mg/dL Final   BUN 97/78/7975 14  7 - 25 mg/dL Final   Creat 97/78/7975 0.86  0.70 - 1.30 mg/dL Final   eGFR 97/78/7975 104  > OR = 60 mL/min/1.62m2 Final   BUN/Creatinine Ratio 05/03/2022 SEE NOTE:  6 - 22 (calc) Final   Sodium 05/03/2022 141  135 - 146 mmol/L Final   Potassium 05/03/2022 4.6  3.5 - 5.3 mmol/L Final   Chloride 05/03/2022 106  98 - 110 mmol/L Final   CO2 05/03/2022 28  20 - 32 mmol/L Final   Calcium  05/03/2022 9.5  8.6 - 10.3 mg/dL Final   Total Protein 97/78/7975 7.1  6.1 - 8.1 g/dL Final   Albumin 97/78/7975 4.1  3.6 - 5.1 g/dL Final   Globulin 97/78/7975 3.0  1.9 - 3.7 g/dL (calc) Final   AG Ratio 05/03/2022 1.4  1.0 - 2.5 (calc) Final   Total Bilirubin 05/03/2022 0.4  0.2 - 1.2 mg/dL Final   Alkaline phosphatase (APISO) 05/03/2022 84  35 - 144 U/L Final   AST 05/03/2022 17  10 - 35 U/L Final   ALT 05/03/2022 18  9 - 46 U/L Final   Cholesterol 05/03/2022 150  <200 mg/dL Final   HDL 97/78/7975 42  > OR = 40 mg/dL Final   Triglycerides 97/78/7975 200 (H)  <150 mg/dL Final   LDL Cholesterol (Calc) 05/03/2022 78  mg/dL (calc) Final   Total CHOL/HDL Ratio 05/03/2022 3.6  <4.9 (calc) Final   Non-HDL Cholesterol (Calc) 05/03/2022 108  <130 mg/dL (calc) Final   Hgb J8r MFr Bld 05/03/2022 6.0 (H)  <5.7 % of total Hgb Final   Mean Plasma Glucose 05/03/2022 126  mg/dL Final   eAG (mmol/L) 97/78/7975 7.0  mmol/L Final   Vit D, 25-Hydroxy 05/03/2022 31  30 - 100 ng/mL Final  Office Visit on 01/18/2022  Component Date Value Ref Range Status   WBC 01/18/2022 8.9  3.8 - 10.8 Thousand/uL Final   RBC 01/18/2022 4.48  4.20 - 5.80 Million/uL Final   Hemoglobin 01/18/2022 13.3  13.2 - 17.1 g/dL Final   HCT 88/91/7976 39.5  38.5 - 50.0 % Final   MCV 01/18/2022 88.2   80.0 - 100.0 fL Final   MCH 01/18/2022 29.7  27.0 - 33.0 pg Final   MCHC 01/18/2022 33.7  32.0 - 36.0 g/dL Final   RDW 88/91/7976 14.4  11.0 - 15.0 % Final  Platelets 01/18/2022 281  140 - 400 Thousand/uL Final   MPV 01/18/2022 10.4  7.5 - 12.5 fL Final   Neutro Abs 01/18/2022 6,088  1,500 - 7,800 cells/uL Final   Lymphs Abs 01/18/2022 1,931  850 - 3,900 cells/uL Final   Absolute Monocytes 01/18/2022 676  200 - 950 cells/uL Final   Eosinophils Absolute 01/18/2022 187  15 - 500 cells/uL Final   Basophils Absolute 01/18/2022 18  0 - 200 cells/uL Final   Neutrophils Relative % 01/18/2022 68.4  % Final   Total Lymphocyte 01/18/2022 21.7  % Final   Monocytes Relative 01/18/2022 7.6  % Final   Eosinophils Relative 01/18/2022 2.1  % Final   Basophils Relative 01/18/2022 0.2  % Final   Glucose, Bld 01/18/2022 117 (H)  65 - 99 mg/dL Final   BUN 88/91/7976 20  7 - 25 mg/dL Final   Creat 88/91/7976 0.94  0.70 - 1.30 mg/dL Final   eGFR 88/91/7976 98  > OR = 60 mL/min/1.53m2 Final   BUN/Creatinine Ratio 01/18/2022 SEE NOTE:  6 - 22 (calc) Final   Sodium 01/18/2022 138  135 - 146 mmol/L Final   Potassium 01/18/2022 4.5  3.5 - 5.3 mmol/L Final   Chloride 01/18/2022 103  98 - 110 mmol/L Final   CO2 01/18/2022 26  20 - 32 mmol/L Final   Calcium  01/18/2022 9.9  8.6 - 10.3 mg/dL Final   Total Protein 88/91/7976 7.4  6.1 - 8.1 g/dL Final   Albumin 88/91/7976 4.3  3.6 - 5.1 g/dL Final   Globulin 88/91/7976 3.1  1.9 - 3.7 g/dL (calc) Final   AG Ratio 01/18/2022 1.4  1.0 - 2.5 (calc) Final   Total Bilirubin 01/18/2022 0.4  0.2 - 1.2 mg/dL Final   Alkaline phosphatase (APISO) 01/18/2022 87  35 - 144 U/L Final   AST 01/18/2022 21  10 - 35 U/L Final   ALT 01/18/2022 26  9 - 46 U/L Final   Magnesium 01/18/2022 1.8  1.5 - 2.5 mg/dL Final   Cholesterol 88/91/7976 158  <200 mg/dL Final   HDL 88/91/7976 41  > OR = 40 mg/dL Final   Triglycerides 88/91/7976 276 (H)  <150 mg/dL Final   LDL Cholesterol (Calc)  01/18/2022 81  mg/dL (calc) Final   Total CHOL/HDL Ratio 01/18/2022 3.9  <4.9 (calc) Final   Non-HDL Cholesterol (Calc) 01/18/2022 117  <130 mg/dL (calc) Final   TSH 88/91/7976 1.39  0.40 - 4.50 mIU/L Final   Hgb A1c MFr Bld 01/18/2022 6.0 (H)  <5.7 % of total Hgb Final   Mean Plasma Glucose 01/18/2022 126  mg/dL Final   eAG (mmol/L) 88/91/7976 7.0  mmol/L Final   Insulin  01/18/2022 60.8 (H)  uIU/mL Final   Vit D, 25-Hydroxy 01/18/2022 27 (L)  30 - 100 ng/mL Final  No image results found. No results found.       Assessment & Plan Type 2 diabetes mellitus with stage 1 chronic kidney disease, without long-term current use of insulin  (HCC) The patient's blood glucose levels are well-controlled with Ozempic , evidenced by an HbA1c consistently around 6.0-6.3%. However, he experiences significant nausea and occasional vomiting, likely due to Ozempic , with two episodes of vomiting in the past week. To improve weight loss and glycemic control while reducing side effects, a switch to Mounjaro  is suggested, though insurance coverage may pose a challenge. Despite the gastrointestinal issues, the A1c is not a concern at this time due to good control with current medication. Initiate the process to  switch from Ozempic  to Mounjaro  for better tolerance and efficacy. Prescribe Zofran for nausea management and perform blood work to assess kidney function and cholesterol levels. Morbid obesity (HCC) The patient engages in physical activities such as gardening and golf, which are beneficial for health and weight management. He does not consume alcohol, which is positive for liver health. Encouraged to continue physical activity and advised caution to prevent back injury. The transition to Mounjaro  is expected to further assist in weight management, given the recent weight fluctuations and stress-related gains. Essential hypertension Blood pressure is well-controlled with the current medication regimen, with  readings between 120/72 and 130/90 mmHg. He has discontinued bisoprolol  as per previous advice, and his blood pressure remains stable without it. Continue the current antihypertensive regimen without bisoprolol . Hyperlipidemia associated with type 2 diabetes mellitus (HCC) Given the patient's borderline elevations in triglycerides (203 mg/dL) and LDL cholesterol (889 mg/dL), continued monitoring and management are essential. The lipid panel will be reassessed during lab work to guide treatment adjustments as necessary. Central adiposity The patient's central adiposity is being addressed through lifestyle modifications and medication adjustments. Encouragement of physical activity and dietary management continues to be a focus to mitigate associated cardiovascular risks. Nausea The patient experiences significant nausea and occasional vomiting, likely due to Ozempic . To manage these side effects, Zofran has been prescribed. The switch to Mounjaro  is anticipated to alleviate these symptoms while maintaining glycemic control. Side effect of medication The patient experiences significant nausea and occasional vomiting, likely due to Ozempic . To manage these side effects, Zofran has been prescribed. The switch to Mounjaro  is anticipated to alleviate these symptoms while maintaining glycemic control. Vitamin D  deficiency The patient is on vitamin D  supplementation and adheres to the regimen, with current levels at 41.3 ng/mL. Plans to check vitamin D  levels during the upcoming lab work to ensure adequacy of supplementation. Iron deficiency Iron deficiency is present, with ferritin levels at 41.4 ng/mL. Current levels will be assessed during lab work to determine if further intervention is needed. Low testosterone  The patient has a documented low testosterone  level of 227.7 ng/dL, which will be followed up with further evaluation in the context of his overall metabolic profile. Fatty liver The presence of  fatty liver is noted, and a FIB-4 reflex test will be performed to assess severity and support the switch to Mounjaro  for potential weight loss benefits. Alcohol consumption is advised against to protect liver health. Suspected sleep apnea Although the patient reports improved sleep quality, a history of sleep issues and suspected sleep apnea is noted. Further sleep studies are deferred unless symptoms recur. Anxiety with agitation The patient is on escitalopram  (Lexapro ) for mood stabilization, currently at the maximum dose. He reports stress related to personal issues but feels it is improving. No change in medication is advised due to potential side effects of alternatives. Prescribe a year's supply of escitalopram  (Lexapro ). Depression, unspecified depression type He is on escitalopram  (Lexapro ) for mood stabilization, currently at the maximum dose. Reports stress related to personal issues but feels it is improving. No change in medication is advised due to potential side effects of alternatives. Prescribe a year's supply of escitalopram  (Lexapro ).  Plans to follow up on lab results and adjust treatment as necessary. The patient is advised to consider using [facility] for medication delivery to reduce costs. Follow up on lab results and adjust treatment as necessary. Consider using [facility] for medication delivery.        Orders Placed in Encounter:  Lab Orders         CBC with Differential/Platelet         Comprehensive metabolic panel with GFR         Lipid panel         Microalbumin / creatinine urine ratio         Cortisol         Testosterone          Vitamin D  (25 hydroxy)         Ferritin         FIB-4 W/REFLEX TO ELF     Imaging Orders  No imaging studies ordered today   Referral Orders  No referral(s) requested today   Meds ordered this encounter  Medications   tirzepatide  (MOUNJARO ) 2.5 MG/0.5ML Pen    Sig: Inject 2.5 mg into the skin once a week.     Dispense:  2 mL    Refill:  11    Struggling to tolerate Ozempic  due to gastrointestinal side effect(s)   ondansetron (ZOFRAN-ODT) 4 MG disintegrating tablet    Sig: Take 1 tablet (4 mg total) by mouth every 8 (eight) hours as needed for nausea or vomiting.    Dispense:  60 tablet    Refill:  4   escitalopram  (LEXAPRO ) 20 MG tablet    Sig: Take 1 tablet Daily for Mood    Dispense:  90 tablet    Refill:  3        This document was synthesized by artificial intelligence ([employer]) using HIPAA-compliant recording of the clinical interaction;   We discussed the use of AI scribe software for clinical note transcription with the patient, who gave verbal consent to proceed. additional Info: This encounter employed state-of-the-art, real-time, collaborative documentation. The patient actively reviewed and assisted in updating their electronic medical record on a shared screen, ensuring transparency and facilitating joint problem-solving for the problem list, overview, and plan. This approach promotes accurate, informed care. The treatment plan was discussed and reviewed in detail, including medication safety, potential side effects, and all patient questions. We confirmed understanding and comfort with the plan. Follow-up instructions were established, including contacting the office for any concerns, returning if symptoms worsen, persist, or new symptoms develop, and precautions for potential emergency department visits.

## 2023-09-03 NOTE — Assessment & Plan Note (Signed)
 The presence of fatty liver is noted, and a FIB-4 reflex test will be performed to assess severity and support the switch to Mounjaro  for potential weight loss benefits. Alcohol consumption is advised against to protect liver health.

## 2023-09-04 ENCOUNTER — Ambulatory Visit: Payer: Self-pay | Admitting: Internal Medicine

## 2023-09-05 LAB — FIB-4 W/REFLEX TO ELF
ALT: 22 IU/L (ref 0–44)
AST: 22 IU/L (ref 0–40)

## 2023-09-06 ENCOUNTER — Other Ambulatory Visit (HOSPITAL_BASED_OUTPATIENT_CLINIC_OR_DEPARTMENT_OTHER): Payer: Self-pay

## 2023-09-06 NOTE — Progress Notes (Signed)
 Please see interpretation written in the Patient Results Comments. Bernardino KANDICE Cone, MD 09/06/2023 6:10 PM

## 2023-09-07 ENCOUNTER — Other Ambulatory Visit (HOSPITAL_COMMUNITY): Payer: Self-pay

## 2023-09-07 NOTE — Telephone Encounter (Signed)
 Pharmacy Patient Advocate Encounter  Received notification from [employer] that Prior Authorization for {Mounjaro  2.5MG /0.5ML auto-injectors has been APPROVED from 09/03/23 to 09/06/24. Ran test claim, Copay is $25.00. This test claim was processed through [facility]- copay amounts may vary at other pharmacies due to pharmacy/plan contracts, or as the patient moves through the different stages of their insurance plan.   PA #/Case ID/Reference #: [id number]

## 2023-09-10 ENCOUNTER — Other Ambulatory Visit (HOSPITAL_BASED_OUTPATIENT_CLINIC_OR_DEPARTMENT_OTHER): Payer: Self-pay

## 2023-09-10 ENCOUNTER — Encounter: Payer: Managed Care, Other (non HMO) | Admitting: Internal Medicine

## 2023-09-13 ENCOUNTER — Other Ambulatory Visit (HOSPITAL_BASED_OUTPATIENT_CLINIC_OR_DEPARTMENT_OTHER): Payer: Self-pay

## 2023-09-19 ENCOUNTER — Other Ambulatory Visit (HOSPITAL_BASED_OUTPATIENT_CLINIC_OR_DEPARTMENT_OTHER): Payer: Self-pay

## 2023-09-20 ENCOUNTER — Other Ambulatory Visit (HOSPITAL_BASED_OUTPATIENT_CLINIC_OR_DEPARTMENT_OTHER): Payer: Self-pay

## 2023-09-24 ENCOUNTER — Encounter: Payer: Managed Care, Other (non HMO) | Admitting: Internal Medicine

## 2023-10-22 ENCOUNTER — Other Ambulatory Visit (HOSPITAL_BASED_OUTPATIENT_CLINIC_OR_DEPARTMENT_OTHER): Payer: Self-pay

## 2023-11-07 ENCOUNTER — Other Ambulatory Visit: Payer: Self-pay

## 2023-11-07 ENCOUNTER — Other Ambulatory Visit (HOSPITAL_BASED_OUTPATIENT_CLINIC_OR_DEPARTMENT_OTHER): Payer: Self-pay

## 2023-11-17 ENCOUNTER — Other Ambulatory Visit (HOSPITAL_BASED_OUTPATIENT_CLINIC_OR_DEPARTMENT_OTHER): Payer: Self-pay

## 2023-12-03 ENCOUNTER — Other Ambulatory Visit (HOSPITAL_BASED_OUTPATIENT_CLINIC_OR_DEPARTMENT_OTHER): Payer: Self-pay

## 2023-12-10 ENCOUNTER — Encounter: Payer: Self-pay | Admitting: Internal Medicine

## 2023-12-10 ENCOUNTER — Ambulatory Visit: Admitting: Internal Medicine

## 2023-12-10 ENCOUNTER — Other Ambulatory Visit (HOSPITAL_BASED_OUTPATIENT_CLINIC_OR_DEPARTMENT_OTHER): Payer: Self-pay

## 2023-12-10 VITALS — BP 140/80 | HR 73 | Temp 98.0°F | Ht 72.0 in | Wt 249.0 lb

## 2023-12-10 DIAGNOSIS — N181 Chronic kidney disease, stage 1: Secondary | ICD-10-CM

## 2023-12-10 DIAGNOSIS — E785 Hyperlipidemia, unspecified: Secondary | ICD-10-CM

## 2023-12-10 DIAGNOSIS — R29818 Other symptoms and signs involving the nervous system: Secondary | ICD-10-CM

## 2023-12-10 DIAGNOSIS — R110 Nausea: Secondary | ICD-10-CM

## 2023-12-10 DIAGNOSIS — I10 Essential (primary) hypertension: Secondary | ICD-10-CM

## 2023-12-10 DIAGNOSIS — R7989 Other specified abnormal findings of blood chemistry: Secondary | ICD-10-CM

## 2023-12-10 DIAGNOSIS — E559 Vitamin D deficiency, unspecified: Secondary | ICD-10-CM

## 2023-12-10 DIAGNOSIS — K760 Fatty (change of) liver, not elsewhere classified: Secondary | ICD-10-CM

## 2023-12-10 DIAGNOSIS — E291 Testicular hypofunction: Secondary | ICD-10-CM

## 2023-12-10 DIAGNOSIS — E1122 Type 2 diabetes mellitus with diabetic chronic kidney disease: Secondary | ICD-10-CM | POA: Diagnosis not present

## 2023-12-10 DIAGNOSIS — Z7985 Long-term (current) use of injectable non-insulin antidiabetic drugs: Secondary | ICD-10-CM

## 2023-12-10 DIAGNOSIS — D500 Iron deficiency anemia secondary to blood loss (chronic): Secondary | ICD-10-CM

## 2023-12-10 DIAGNOSIS — E1169 Type 2 diabetes mellitus with other specified complication: Secondary | ICD-10-CM | POA: Diagnosis not present

## 2023-12-10 LAB — CBC WITH DIFFERENTIAL/PLATELET
Basophils Absolute: 0.1 K/uL (ref 0.0–0.1)
Basophils Relative: 0.6 % (ref 0.0–3.0)
Eosinophils Absolute: 0.2 K/uL (ref 0.0–0.7)
Eosinophils Relative: 2.4 % (ref 0.0–5.0)
HCT: 42.2 % (ref 39.0–52.0)
Hemoglobin: 14.2 g/dL (ref 13.0–17.0)
Lymphocytes Relative: 17.9 % (ref 12.0–46.0)
Lymphs Abs: 1.6 K/uL (ref 0.7–4.0)
MCHC: 33.7 g/dL (ref 30.0–36.0)
MCV: 88.8 fl (ref 78.0–100.0)
Monocytes Absolute: 0.7 K/uL (ref 0.1–1.0)
Monocytes Relative: 7.8 % (ref 3.0–12.0)
Neutro Abs: 6.5 K/uL (ref 1.4–7.7)
Neutrophils Relative %: 71.3 % (ref 43.0–77.0)
Platelets: 264 K/uL (ref 150.0–400.0)
RBC: 4.75 Mil/uL (ref 4.22–5.81)
RDW: 14.8 % (ref 11.5–15.5)
WBC: 9.1 K/uL (ref 4.0–10.5)

## 2023-12-10 LAB — COMPREHENSIVE METABOLIC PANEL WITH GFR
ALT: 20 U/L (ref 0–53)
AST: 17 U/L (ref 0–37)
Albumin: 4.3 g/dL (ref 3.5–5.2)
Alkaline Phosphatase: 93 U/L (ref 39–117)
BUN: 18 mg/dL (ref 6–23)
CO2: 31 meq/L (ref 19–32)
Calcium: 10.2 mg/dL (ref 8.4–10.5)
Chloride: 102 meq/L (ref 96–112)
Creatinine, Ser: 1.07 mg/dL (ref 0.40–1.50)
GFR: 78.67 mL/min (ref >60.00)
Glucose, Bld: 114 mg/dL — ABNORMAL HIGH (ref 70–99)
Potassium: 4.9 meq/L (ref 3.5–5.1)
Sodium: 140 meq/L (ref 135–145)
Total Bilirubin: 0.4 mg/dL (ref 0.2–1.2)
Total Protein: 7.2 g/dL (ref 6.0–8.3)

## 2023-12-10 LAB — LIPID PANEL
Cholesterol: 174 mg/dL (ref 0–200)
HDL: 49.3 mg/dL (ref >39.00)
LDL Cholesterol: 83 mg/dL (ref 0–99)
NonHDL: 124.52
Total CHOL/HDL Ratio: 4
Triglycerides: 207 mg/dL — ABNORMAL HIGH (ref 0.0–149.0)
VLDL: 41.4 mg/dL — ABNORMAL HIGH (ref 0.0–40.0)

## 2023-12-10 LAB — MICROALBUMIN / CREATININE URINE RATIO
Creatinine,U: 135 mg/dL
Microalb Creat Ratio: 1275.8 mg/g — ABNORMAL HIGH (ref 0.0–30.0)
Microalb, Ur: 172.3 mg/dL — ABNORMAL HIGH (ref 0.0–1.9)

## 2023-12-10 LAB — VITAMIN D 25 HYDROXY (VIT D DEFICIENCY, FRACTURES): VITD: 30.57 ng/mL (ref 30.00–100.00)

## 2023-12-10 LAB — TESTOSTERONE: Testosterone: 168.04 ng/dL — ABNORMAL LOW (ref 300.00–890.00)

## 2023-12-10 LAB — HEMOGLOBIN A1C: Hgb A1c MFr Bld: 6.4 % (ref 4.6–6.5)

## 2023-12-10 MED ORDER — ONDANSETRON 4 MG PO TBDP
4.0000 mg | ORAL_TABLET | Freq: Three times a day (TID) | ORAL | 4 refills | Status: DC | PRN
  Filled 2023-12-10: qty 60, 20d supply, fill #0

## 2023-12-10 MED ORDER — LOSARTAN POTASSIUM 50 MG PO TABS
50.0000 mg | ORAL_TABLET | Freq: Every day | ORAL | 3 refills | Status: DC
  Filled 2023-12-10: qty 90, 90d supply, fill #0

## 2023-12-10 MED ORDER — TIRZEPATIDE 2.5 MG/0.5ML ~~LOC~~ SOAJ
2.5000 mg | SUBCUTANEOUS | 11 refills | Status: DC
  Filled 2023-12-10: qty 2, 28d supply, fill #0

## 2023-12-10 NOTE — Progress Notes (Unsigned)
 ==============================  [facility]: [phone]   -- Medical Office Visit --  Patient: Charles Harrell      Age: 53 y.o.       Sex:  male  Date:   12/10/2023 Today's Healthcare Provider: Bernardino KANDICE Cone, MD  ==============================   Chief Complaint: Diabetes (Pt has not been taking his diabetes medication would like to discuss today with pcp) Recovering emotionally due to breakup. Blood pressure high Financial hardship just now resolved.  Discussed the use of AI scribe software for clinical note transcription with the patient, who gave verbal consent to proceed.  History of Present Illness Charles Harrell is a 53 year old male with diabetes and hypertension who presents for management of his chronic conditions and recent weight gain.  He has recently resumed his diabetes medication after a period of financial hardship that prevented him from affording it. He is currently taking Ozempic , which he restarted this past week, and experiences nausea with it, a concern he had previously. He is interested in switching to Mounjaro .  His blood pressure has been high recently. He is currently taking losartan , which he has been consistent with, and bisoprolol . His blood pressure was well-controlled previously but has increased.  He mentions a weight gain of eight pounds over the last three months, with his current weight at 249 pounds, up from 241 pounds in June. He attributes this to coming off his medications and notes that his appetite decreased significantly when he restarted Ozempic . He follows a diet of one meal a day and has cut out sugary drinks, consuming them only once or twice a week.  He has a history of fatty liver, high cholesterol, and iron deficiency. He also suspects he has sleep apnea, as he never has a good night's sleep.  Socially, he has recently resolved financial hardships and has started a new job. He enjoys  gardening, which he finds beneficial for his mental health, and has improved his golf game since losing weight. He is recovering from a recent relationship breakup, which he describes as a mental and emotional challenge but does not feel the need for medication or counseling.  Wt Readings from Last 50 Encounters:  12/10/23 249 lb (112.9 kg)  09/03/23 241 lb 12.8 oz (109.7 kg)  05/31/23 242 lb 12.8 oz (110.1 kg)  03/26/23 255 lb 9.6 oz (115.9 kg)  12/25/22 256 lb 9.6 oz (116.4 kg)  11/07/22 260 lb (117.9 kg)  09/01/22 258 lb (117 kg)  05/03/22 265 lb (120.2 kg)  01/18/22 271 lb (122.9 kg)  10/17/21 283 lb 3.2 oz (128.5 kg)  07/12/21 287 lb 12.8 oz (130.5 kg)  03/24/21 286 lb 6.4 oz (129.9 kg)  10/11/20 282 lb (127.9 kg)  06/28/20 290 lb (131.5 kg)  03/24/20 279 lb 9.6 oz (126.8 kg)  03/17/20 279 lb (126.6 kg)  02/02/20 286 lb (129.7 kg)  01/19/20 286 lb (129.7 kg)  12/18/19 278 lb (126.1 kg)  09/30/19 (!) 302 lb 12.8 oz (137.3 kg)  08/28/19 299 lb 6.4 oz (135.8 kg)  05/28/19 (!) 306 lb 6.4 oz (139 kg)  02/25/19 296 lb 12.8 oz (134.6 kg)  12/17/18 292 lb 9.6 oz (132.7 kg)  12/03/18 286 lb (129.7 kg)  11/26/18 277 lb 9.6 oz (125.9 kg)  08/20/18 291 lb (132 kg)  05/24/18 299 lb (135.6 kg)  03/19/18 298 lb (135.2 kg)  02/06/18 (!) 303 lb 12.8 oz (137.8 kg)  09/17/17 (!) 302 lb (137 kg)  02/23/17 (!) 305 lb (138.3 kg)  11/08/16 (!) 303 lb 9.6 oz (137.7 kg)  05/12/16 292 lb (132.5 kg)  04/05/16 289 lb 3.2 oz (131.2 kg)   BMI Readings from Last 50 Encounters:  12/10/23 33.77 kg/m  09/03/23 32.79 kg/m  05/31/23 32.93 kg/m  03/26/23 34.67 kg/m  12/25/22 33.85 kg/m  11/07/22 34.30 kg/m  09/01/22 34.04 kg/m  05/03/22 34.96 kg/m  01/18/22 35.75 kg/m  10/17/21 37.36 kg/m  07/12/21 37.97 kg/m  03/24/21 37.79 kg/m  10/11/20 37.21 kg/m  06/28/20 38.26 kg/m  03/24/20 36.89 kg/m  03/17/20 37.84 kg/m  02/02/20 38.79 kg/m  01/19/20 38.26 kg/m  12/18/19 37.19  kg/m  09/30/19 40.50 kg/m  08/28/19 40.05 kg/m  05/28/19 40.42 kg/m  02/25/19 39.16 kg/m  12/30/2018 39.14 kg/m  12/03/18 38.26 kg/m  11/26/18 37.13 kg/m  08/20/18 38.92 kg/m  05/24/18 39.99 kg/m  03/19/18 39.86 kg/m  02/06/18 40.64 kg/m  09/17/17 40.12 kg/m  02/23/17 40.52 kg/m  11/08/16 40.33 kg/m  05/12/16 38.79 kg/m  04/05/16 38.42 kg/m    {NEXT: Evaluate low testosterone  + iron deficiency; review medication adherence (esp. Ozempic  frequency); assess weight loss progress toward 225 lb goal  MONITOR: -Labs: A1c stable at 6.0% despite elevated glucose (123) + insulin  (18.8); iron saturation remains low (15.7%); testosterone  low (221.04 ng/dL) -Vitals: BP improved from 128/98 to 130/90 - target <130/80; weight down to 242 lbs from 255 lbs (goal: 225 lbs) -Gaps: Diabetic eye exam (due since 2019-12-30); urine ACR (due since 03/2021); Tdap (due since 03/2022)  PLAN: -F/U: Schedule in 2 months to assess testosterone  level response; coordinate dental work completion (root canal needed) -Refs: Consider endocrinology if testosterone  remains low; ophthalmology for overdue diabetic eye exam -Rx: Verify weekly (not biweekly) Ozempic  administration; consider iron supplementation; assess testosterone  replacement options  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 Self-modified Ozempic  dosing schedule (taking every 10-12 days vs prescribed weekly) may affect glycemic and weight management consistency. Reports fatigue likely multifactorial (low vitamin D  + iron deficiency + low testosterone ). Newly identified low testosterone  (221.04 ng/dL) warrants evaluation for potential replacement therapy. Iron deficiency anemia persists despite prior identification. Dental infection requiring root canal may impact diabetes management. Patient highly motivated by father's early death 2018-12-30) to improve  health.  HEALTH MAINTENANCE PRIORITIES: 1. Diabetic eye exam (overdue since December 30, 2019) 2. Urine microalbumin testing (overdue since 03/2021) 3. Tdap vaccination (due since 03/2022) 4. Colonoscopy (due 01/2023) 5. Shingrix vaccination (never received)  GUIDELINE ADHERENCE: 1. ADA 2025: Consider starting ARB (initiated losartan  25mg ) for kidney protection with diabetes 2. ADA 2025: Emphasize weekly GLP-1 RA administration for optimal glycemic control 3. ADA 2025: Annual eye exam for established diabetes patients 4. Low Testosterone : Consider replacement if symptomatic and levels <300 ng/dL with confirmed morning repeat testing 5. Iron Deficiency: Consider oral supplementation; evaluate causes if persistent  TEST INTERPRETATIONS: - A1c controlled (6.0%) but random glucose elevated (123); suggests possible glucose variability - Insulin  level high normal (18.8), indicative of insulin  resistance despite weight loss - Lipid panel well-controlled (LDL 72) on rosuvastatin  40mg  - Iron studies show persistent deficiency pattern (saturation 15.7% with normal ferritin) - Testosterone  significantly below reference range (221.04 vs >300 ng/dL) - Kidney function stable (GFR 87.7) with no recent microalbumin testing  MEDICATION REVIEW: - Verify rosuvastatin  40mg  5x/week adherence (lipids well-controlled) - Assess metformin  1000mg  BID tolerance and adherence - Confirm semaglutide  2mg  weekly dosing (patient taking biweekly) - Review losartan   25mg  daily for kidney protection (newly initiated) - Bisoprolol -HCTZ adequately controlling BP - Consider oral iron supplementation - Consider testosterone  replacement options if morning level confirms deficiency  PERSONAL REMINDERS: - Emphasize weekly Ozempic  administration - explain potential negative effects of inconsistent dosing - Check dental infection status - untreated infection can worsen glucose control - Review home glucose monitoring frequency and  results - Assess energy levels in relation to vitamin D  supplementation - Discuss strategies to eliminate (not just reduce) sugary beverages - Acknowledge excellent weight loss progress and reinforce realistic timeline to goal - Probe occupational challenges - chef occupation presents dietary temptation challenges - Verify [employer] side job not interfering with health priorities:1 Problem List as of 12/10/2023 Reviewed: 12/10/2023  8:58 AM by Thedora Maire GRADE, CMA    Central adiposity   Last Assessment & Plan 09/03/2023 Office Visit Written 09/03/2023  7:30 PM by Jesus Bernardino MATSU, MD  The patient's central adiposity is being addressed through lifestyle modifications and medication adjustments. Encouragement of physical activity and dietary management continues to be a focus to mitigate associated cardiovascular risks.      CKD stage 1 due to type 2 diabetes mellitus [facility])   Last Assessment & Plan 05/31/2023 Office Visit Written 05/31/2023  6:05 PM by Jesus Bernardino MATSU, MD  No results found for: GFR Will order lab testing to guide management.       Essential hypertension   Last Assessment & Plan 09/03/2023 Office Visit Written 09/03/2023  7:30 PM by Jesus Bernardino MATSU, MD  Blood pressure is well-controlled with the current medication regimen, with readings between 120/72 and 130/90 mmHg. He has discontinued bisoprolol  as per previous advice, and his blood pressure remains stable without it. Continue the current antihypertensive regimen without bisoprolol .      Fatty liver   Last Assessment & Plan 09/03/2023 Office Visit Written 09/03/2023  7:30 PM by Jesus Bernardino MATSU, MD  The presence of fatty liver is noted, and a FIB-4 reflex test will be performed to assess severity and support the switch to Mounjaro  for potential weight loss benefits. Alcohol consumption is advised against to protect liver health.      Hyperlipidemia associated with type 2 diabetes mellitus [facility])   Last Assessment & Plan  09/03/2023 Office Visit Written 09/03/2023  7:30 PM by Jesus Bernardino MATSU, MD  Given the patient's borderline elevations in triglycerides (203 mg/dL) and LDL cholesterol (889 mg/dL), continued monitoring and management are essential. The lipid panel will be reassessed during lab work to guide treatment adjustments as necessary.      Iron deficiency anemia   Long-term current use of injectable noninsulin antidiabetic medication   Morbid obesity [facility])   Last Assessment & Plan 09/03/2023 Office Visit Written 09/03/2023  7:30 PM by Jesus Bernardino MATSU, MD  The patient engages in physical activities such as gardening and golf, which are beneficial for health and weight management. He does not consume alcohol, which is positive for liver health. Encouraged to continue physical activity and advised caution to prevent back injury. The transition to Mounjaro  is expected to further assist in weight management, given the recent weight fluctuations and stress-related gains.      Side effect of medication   Last Assessment & Plan 09/03/2023 Office Visit Written 09/03/2023  7:30 PM by Jesus Bernardino MATSU, MD  The patient experiences significant nausea and occasional vomiting, likely due to Ozempic . To manage these side effects, Zofran  has been prescribed. The switch to Mounjaro  is anticipated to alleviate these symptoms  while maintaining glycemic control.      Type 2 diabetes mellitus [facility])   Last Assessment & Plan 09/03/2023 Office Visit Written 09/03/2023  7:30 PM by Jesus Bernardino MATSU, MD  The patient's blood glucose levels are well-controlled with Ozempic , evidenced by an HbA1c consistently around 6.0-6.3%. However, he experiences significant nausea and occasional vomiting, likely due to Ozempic , with two episodes of vomiting in the past week. To improve weight loss and glycemic control while reducing side effects, a switch to Mounjaro  is suggested, though insurance coverage may pose a challenge. Despite the gastrointestinal  issues, the A1c is not a concern at this time due to good control with current medication. Initiate the process to switch from Ozempic  to Mounjaro  for better tolerance and efficacy. Prescribe Zofran  for nausea management and perform blood work to assess kidney function and cholesterol levels.      Vitamin D  deficiency   Last Assessment & Plan 09/03/2023 Office Visit Written 09/03/2023  7:30 PM by Jesus Bernardino MATSU, MD  The patient is on vitamin D  supplementation and adheres to the regimen, with current levels at 41.3 ng/mL. Plans to check vitamin D  levels during the upcoming lab work to ensure adequacy of supplementation.     :1} {   Updated Problem List Entries: No problems updated.   (optional):1 } Background Reviewed: Problem List: has Essential hypertension; Hyperlipidemia associated with type 2 diabetes mellitus (HCC); Type 2 diabetes mellitus (HCC); Vitamin D  deficiency; Morbid obesity (HCC); 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 Past Medical History:  has a past medical history of Depression, Diabetes mellitus without complication (HCC), Hyperlipidemia, Hypertension, Morbid obesity (HCC), Plantar fasciitis, and Vitamin D  deficiency. Past Surgical History:   has a past surgical history that includes Root canal (2021). Social History:   reports that he quit smoking about 12 years ago. His smoking use included cigarettes. He has never used smokeless tobacco. He reports that he does not currently use drugs after having used the following drugs: Marijuana. He reports that he does not drink alcohol. Family History:  family history includes COPD in his father; Diabetes in his father; Early death in his father; Heart disease in his father; Hyperlipidemia in his father and mother; Hypertension in his father and  mother; Lung cancer in his mother. Allergies:  is allergic to poison ivy extract.   Medication Reconciliation: Current Outpatient Medications on File Prior to Visit  Medication Sig   aspirin 81 MG chewable tablet Chew by mouth daily.   Cholecalciferol  (VITAMIN D3) 125 MCG (5000 UT) CAPS Take 1 capsule (5,000 Units total) by mouth daily.   escitalopram  (LEXAPRO ) 20 MG tablet Take 1 tablet Daily for Mood   losartan  (COZAAR ) 25 MG tablet Take 1 tablet (25 mg total) by mouth daily.   metFORMIN  (GLUCOPHAGE -XR) 500 MG 24 hr tablet Take  2 tablets 2 x/ day with Meals for Diabetes                                              /  TAKE                                                   BY                                                    MOUTH (Patient taking differently: Take  2 tablets 2 x/ day with Meals for Diabetes                                              /                                                    TAKE                                                   BY                                                    MOUTH  Only taking 2  a day not 4 a day)   ondansetron  (ZOFRAN -ODT) 4 MG disintegrating tablet Take 1 tablet (4 mg total) by mouth every 8 (eight) hours as needed for nausea or vomiting.   rosuvastatin  (CRESTOR ) 40 MG tablet Take 1 tablet by mouth 5 times a  week for Cholesterol   TURMERIC PO Take by mouth daily.   Semaglutide , 2 MG/DOSE, 8 MG/3ML SOPN Inject 2 mg into the skin once a week.   tirzepatide  (MOUNJARO ) 2.5 MG/0.5ML Pen Inject 2.5 mg into the skin once a week.   No current facility-administered medications on file prior to visit.  There are no discontinued medications.   Physical Exam:    12/10/2023    9:13 AM 12/10/2023    8:57 AM 09/03/2023    8:38 AM  Vitals with BMI  Height  6' 0 6' 0  Weight  249 lbs 241 lbs 13 oz  BMI  33.76 32.79  Systolic 140 146 879  Diastolic 80 94 72  Pulse  73 79  Vital signs reviewed.   Nursing notes reviewed. Weight trend reviewed. Physical Activity: Not on file   General Appearance:  No acute distress appreciable.   Well-groomed, healthy-appearing male.  Well proportioned with no abnormal fat distribution.  Good muscle tone. Pulmonary:  Normal work of breathing at rest, no respiratory distress apparent. SpO2: 98 %  Musculoskeletal: All extremities are intact.  Neurological:  Awake, alert, oriented, and engaged.  No obvious focal neurological deficits or cognitive impairments.  Sensorium seems unclouded.   Speech is clear and coherent with logical content. Psychiatric:  Appropriate mood, pleasant and cooperative  demeanor, thoughtful and engaged during the exam   Verbalized to patient: Physical Exam MEASUREMENTS: Weight- 249.   Results:   Verbalized to patient: Results      12/10/2023    9:03 AM 09/03/2023    8:51 AM 05/31/2023    8:48 AM 03/25/2023   11:45 PM  PHQ 2/9 Scores  PHQ - 2 Score 0 2 0 0  PHQ- 9 Score 0 8      { {Insert previous labs (optional):23779} {See past labs  Heme  Chem  Endocrine  Serology  Results Review (optional):1} No results found for any visits on 12/10/23.} Office Visit on 09/03/2023  Component Date Value Ref Range Status   WBC 09/03/2023 8.2  4.0 - 10.5 K/uL Final   RBC 09/03/2023 4.66  4.22 - 5.81 Mil/uL Final   Hemoglobin 09/03/2023 13.9  13.0 - 17.0 g/dL Final   HCT 93/76/7974 41.1  39.0 - 52.0 % Final   MCV 09/03/2023 88.3  78.0 - 100.0 fl Final   MCHC 09/03/2023 33.8  30.0 - 36.0 g/dL Final   RDW 93/76/7974 14.7  11.5 - 15.5 % Final   Platelets 09/03/2023 264.0  150.0 - 400.0 K/uL Final   Neutrophils Relative % 09/03/2023 69.0  43.0 - 77.0 % Final   Lymphocytes Relative 09/03/2023 20.7  12.0 - 46.0 % Final   Monocytes Relative 09/03/2023 7.0  3.0 - 12.0 % Final   Eosinophils Relative 09/03/2023 2.6  0.0 - 5.0 % Final   Basophils Relative 09/03/2023 0.7  0.0 - 3.0 % Final   Neutro Abs 09/03/2023 5.7  1.4 - 7.7  K/uL Final   Lymphs Abs 09/03/2023 1.7  0.7 - 4.0 K/uL Final   Monocytes Absolute 09/03/2023 0.6  0.1 - 1.0 K/uL Final   Eosinophils Absolute 09/03/2023 0.2  0.0 - 0.7 K/uL Final   Basophils Absolute 09/03/2023 0.1  0.0 - 0.1 K/uL Final   Sodium 09/03/2023 138  135 - 145 mEq/L Final   Potassium 09/03/2023 4.8  3.5 - 5.1 mEq/L Final   Chloride 09/03/2023 102  96 - 112 mEq/L Final   CO2 09/03/2023 26  19 - 32 mEq/L Final   Glucose, Bld 09/03/2023 115 (H)  70 - 99 mg/dL Final   BUN 93/76/7974 21  6 - 23 mg/dL Final   Creatinine, Ser 09/03/2023 1.02  0.40 - 1.50 mg/dL Final   Total Bilirubin 09/03/2023 0.4  0.2 - 1.2 mg/dL Final   Alkaline Phosphatase 09/03/2023 82  39 - 117 U/L Final   AST 09/03/2023 20  0 - 37 U/L Final   ALT 09/03/2023 22  0 - 53 U/L Final   Total Protein 09/03/2023 7.0  6.0 - 8.3 g/dL Final   Albumin 93/76/7974 4.2  3.5 - 5.2 g/dL Final   GFR 93/76/7974 83.48  >60.00 mL/min Final   Calcium  09/03/2023 9.8  8.4 - 10.5 mg/dL Final   Cholesterol 93/76/7974 199  0 - 200 mg/dL Final   Triglycerides 93/76/7974 203.0 (H)  0.0 - 149.0 mg/dL Final   HDL 93/76/7974 48.60  >39.00 mg/dL Final   VLDL 93/76/7974 40.6 (H)  0.0 - 40.0 mg/dL Final   LDL Cholesterol 09/03/2023 110 (H)  0 - 99 mg/dL Final   Total CHOL/HDL Ratio 09/03/2023 4   Final   NonHDL 09/03/2023 150.82   Final   Cortisol, Plasma 09/03/2023 13.8  ug/dL Final   Testosterone  09/03/2023 227.70 (L)  300.00 - 890.00 ng/dL Final   VITD 93/76/7974 41.28  30.00 -  100.00 ng/mL Final   Ferritin 09/03/2023 41.4  22.0 - 322.0 ng/mL Final   AST 09/03/2023 22  0 - 40 IU/L Final   ALT 09/03/2023 22  0 - 44 IU/L Final   Platelets 09/03/2023 CANCELED  x10E3/uL Final-Edited   FIB-4 Index 09/03/2023 CANCELED   Final-Edited  Lab on 06/28/2023  Component Date Value Ref Range Status   Insulin  06/28/2023 18.8 (H)  uIU/mL Final   Cholesterol 06/28/2023 148  0 - 200 mg/dL Final   Triglycerides 95/82/7974 136.0  0.0 - 149.0 mg/dL  Final   HDL 95/82/7974 49.50  >39.00 mg/dL Final   VLDL 95/82/7974 27.2  0.0 - 40.0 mg/dL Final   LDL Cholesterol 06/28/2023 72  0 - 99 mg/dL Final   Total CHOL/HDL Ratio 06/28/2023 3   Final   NonHDL 06/28/2023 98.73   Final   Iron 06/28/2023 59  42 - 165 ug/dL Final   Transferrin 95/82/7974 269.0  212.0 - 360.0 mg/dL Final   Saturation Ratios 06/28/2023 15.7 (L)  20.0 - 50.0 % Final   Ferritin 06/28/2023 62.8  22.0 - 322.0 ng/mL Final   TIBC 06/28/2023 376.6  250.0 - 450.0 mcg/dL Final   Hgb J8r MFr Bld 06/28/2023 6.0  4.6 - 6.5 % Final   Sodium 06/28/2023 139  135 - 145 mEq/L Final   Potassium 06/28/2023 5.1  3.5 - 5.1 mEq/L Final   Chloride 06/28/2023 101  96 - 112 mEq/L Final   CO2 06/28/2023 30  19 - 32 mEq/L Final   Glucose, Bld 06/28/2023 123 (H)  70 - 99 mg/dL Final   BUN 95/82/7974 11  6 - 23 mg/dL Final   Creatinine, Ser 06/28/2023 0.98  0.40 - 1.50 mg/dL Final   Total Bilirubin 06/28/2023 0.6  0.2 - 1.2 mg/dL Final   Alkaline Phosphatase 06/28/2023 84  39 - 117 U/L Final   AST 06/28/2023 19  0 - 37 U/L Final   ALT 06/28/2023 20  0 - 53 U/L Final   Total Protein 06/28/2023 7.2  6.0 - 8.3 g/dL Final   Albumin 95/82/7974 4.5  3.5 - 5.2 g/dL Final   GFR 95/82/7974 87.70  >60.00 mL/min Final   Calcium  06/28/2023 10.0  8.4 - 10.5 mg/dL Final   WBC 95/82/7974 10.5  4.0 - 10.5 K/uL Final   RBC 06/28/2023 4.74  4.22 - 5.81 Mil/uL Final   Hemoglobin 06/28/2023 14.5  13.0 - 17.0 g/dL Final   HCT 95/82/7974 42.6  39.0 - 52.0 % Final   MCV 06/28/2023 89.8  78.0 - 100.0 fl Final   MCHC 06/28/2023 33.9  30.0 - 36.0 g/dL Final   RDW 95/82/7974 14.8  11.5 - 15.5 % Final   Platelets 06/28/2023 286.0  150.0 - 400.0 K/uL Final   Neutrophils Relative % 06/28/2023 65.8  43.0 - 77.0 % Final   Lymphocytes Relative 06/28/2023 23.2  12.0 - 46.0 % Final   Monocytes Relative 06/28/2023 7.2  3.0 - 12.0 % Final   Eosinophils Relative 06/28/2023 3.3  0.0 - 5.0 % Final   Basophils Relative  06/28/2023 0.5  0.0 - 3.0 % Final   Neutro Abs 06/28/2023 6.9  1.4 - 7.7 K/uL Final   Lymphs Abs 06/28/2023 2.4  0.7 - 4.0 K/uL Final   Monocytes Absolute 06/28/2023 0.8  0.1 - 1.0 K/uL Final   Eosinophils Absolute 06/28/2023 0.3  0.0 - 0.7 K/uL Final   Basophils Absolute 06/28/2023 0.1  0.0 - 0.1 K/uL Final   Testosterone  06/28/2023  221.04 (L)  300.00 - 890.00 ng/dL Final  Office Visit on 03/26/2023  Component Date Value Ref Range Status   WBC 03/26/2023 11.4 (H)  3.8 - 10.8 Thousand/uL Final   RBC 03/26/2023 4.94  4.20 - 5.80 Million/uL Final   Hemoglobin 03/26/2023 14.3  13.2 - 17.1 g/dL Final   HCT 98/86/7974 43.7  38.5 - 50.0 % Final   MCV 03/26/2023 88.5  80.0 - 100.0 fL Final   MCH 03/26/2023 28.9  27.0 - 33.0 pg Final   MCHC 03/26/2023 32.7  32.0 - 36.0 g/dL Final   RDW 98/86/7974 14.2  11.0 - 15.0 % Final   Platelets 03/26/2023 264  140 - 400 Thousand/uL Final   MPV 03/26/2023 11.1  7.5 - 12.5 fL Final   Neutro Abs 03/26/2023 8,322 (H)  1,500 - 7,800 cells/uL Final   Absolute Lymphocytes 03/26/2023 1,767  850 - 3,900 cells/uL Final   Absolute Monocytes 03/26/2023 1,094 (H)  200 - 950 cells/uL Final   Eosinophils Absolute 03/26/2023 160  15 - 500 cells/uL Final   Basophils Absolute 03/26/2023 57  0 - 200 cells/uL Final   Neutrophils Relative % 03/26/2023 73  % Final   Total Lymphocyte 03/26/2023 15.5  % Final   Monocytes Relative 03/26/2023 9.6  % Final   Eosinophils Relative 03/26/2023 1.4  % Final   Basophils Relative 03/26/2023 0.5  % Final   Glucose, Bld 03/26/2023 123 (H)  65 - 99 mg/dL Final   BUN 98/86/7974 16  7 - 25 mg/dL Final   Creat 98/86/7974 0.99  0.70 - 1.30 mg/dL Final   eGFR 98/86/7974 91  > OR = 60 mL/min/1.58m2 Final   BUN/Creatinine Ratio 03/26/2023 SEE NOTE:  6 - 22 (calc) Final   Sodium 03/26/2023 138  135 - 146 mmol/L Final   Potassium 03/26/2023 4.8  3.5 - 5.3 mmol/L Final   Chloride 03/26/2023 103  98 - 110 mmol/L Final   CO2 03/26/2023 26  20 -  32 mmol/L Final   Calcium  03/26/2023 9.6  8.6 - 10.3 mg/dL Final   Total Protein 98/86/7974 6.9  6.1 - 8.1 g/dL Final   Albumin 98/86/7974 4.0  3.6 - 5.1 g/dL Final   Globulin 98/86/7974 2.9  1.9 - 3.7 g/dL (calc) Final   AG Ratio 03/26/2023 1.4  1.0 - 2.5 (calc) Final   Total Bilirubin 03/26/2023 0.4  0.2 - 1.2 mg/dL Final   Alkaline phosphatase (APISO) 03/26/2023 79  35 - 144 U/L Final   AST 03/26/2023 21  10 - 35 U/L Final   ALT 03/26/2023 28  9 - 46 U/L Final   Magnesium 03/26/2023 1.7  1.5 - 2.5 mg/dL Final   Cholesterol 98/86/7974 178  <200 mg/dL Final   HDL 98/86/7974 53  > OR = 40 mg/dL Final   Triglycerides 98/86/7974 188 (H)  <150 mg/dL Final   LDL Cholesterol (Calc) 03/26/2023 97  mg/dL (calc) Final   Total CHOL/HDL Ratio 03/26/2023 3.4  <4.9 (calc) Final   Non-HDL Cholesterol (Calc) 03/26/2023 125  <130 mg/dL (calc) Final   TSH 98/86/7974 1.06  0.40 - 4.50 mIU/L Final   Hgb A1c MFr Bld 03/26/2023 6.3 (H)  <5.7 % of total Hgb Final   Mean Plasma Glucose 03/26/2023 134  mg/dL Final   eAG (mmol/L) 98/86/7974 7.4  mmol/L Final   Insulin  03/26/2023 89.3 (H)  uIU/mL Final   Vit D, 25-Hydroxy 03/26/2023 17 (L)  30 - 100 ng/mL Final  Office Visit on 12/25/2022  Component Date Value Ref Range Status   WBC 12/25/2022 10.1  3.8 - 10.8 Thousand/uL Final   RBC 12/25/2022 4.61  4.20 - 5.80 Million/uL Final   Hemoglobin 12/25/2022 13.4  13.2 - 17.1 g/dL Final   HCT 89/85/7975 41.2  38.5 - 50.0 % Final   MCV 12/25/2022 89.4  80.0 - 100.0 fL Final   MCH 12/25/2022 29.1  27.0 - 33.0 pg Final   MCHC 12/25/2022 32.5  32.0 - 36.0 g/dL Final   RDW 89/85/7975 14.4  11.0 - 15.0 % Final   Platelets 12/25/2022 280  140 - 400 Thousand/uL Final   MPV 12/25/2022 10.8  7.5 - 12.5 fL Final   Neutro Abs 12/25/2022 6,343  1,500 - 7,800 cells/uL Final   Lymphs Abs 12/25/2022 2,656  850 - 3,900 cells/uL Final   Absolute Monocytes 12/25/2022 798  200 - 950 cells/uL Final   Eosinophils Absolute  12/25/2022 253  15 - 500 cells/uL Final   Basophils Absolute 12/25/2022 51  0 - 200 cells/uL Final   Neutrophils Relative % 12/25/2022 62.8  % Final   Total Lymphocyte 12/25/2022 26.3  % Final   Monocytes Relative 12/25/2022 7.9  % Final   Eosinophils Relative 12/25/2022 2.5  % Final   Basophils Relative 12/25/2022 0.5  % Final   Glucose, Bld 12/25/2022 110 (H)  65 - 99 mg/dL Final   BUN 89/85/7975 24  7 - 25 mg/dL Final   Creat 89/85/7975 1.17  0.70 - 1.30 mg/dL Final   eGFR 89/85/7975 75  > OR = 60 mL/min/1.62m2 Final   BUN/Creatinine Ratio 12/25/2022 SEE NOTE:  6 - 22 (calc) Final   Sodium 12/25/2022 141  135 - 146 mmol/L Final   Potassium 12/25/2022 4.6  3.5 - 5.3 mmol/L Final   Chloride 12/25/2022 105  98 - 110 mmol/L Final   CO2 12/25/2022 26  20 - 32 mmol/L Final   Calcium  12/25/2022 9.8  8.6 - 10.3 mg/dL Final   Total Protein 89/85/7975 7.0  6.1 - 8.1 g/dL Final   Albumin 89/85/7975 4.1  3.6 - 5.1 g/dL Final   Globulin 89/85/7975 2.9  1.9 - 3.7 g/dL (calc) Final   AG Ratio 12/25/2022 1.4  1.0 - 2.5 (calc) Final   Total Bilirubin 12/25/2022 0.4  0.2 - 1.2 mg/dL Final   Alkaline phosphatase (APISO) 12/25/2022 86  35 - 144 U/L Final   AST 12/25/2022 18  10 - 35 U/L Final   ALT 12/25/2022 24  9 - 46 U/L Final   Cholesterol 12/25/2022 145  <200 mg/dL Final   HDL 89/85/7975 48  > OR = 40 mg/dL Final   Triglycerides 89/85/7975 215 (H)  <150 mg/dL Final   LDL Cholesterol (Calc) 12/25/2022 69  mg/dL (calc) Final   Total CHOL/HDL Ratio 12/25/2022 3.0  <4.9 (calc) Final   Non-HDL Cholesterol (Calc) 12/25/2022 97  <130 mg/dL (calc) Final   Hgb J8r MFr Bld 12/25/2022 6.2 (H)  <5.7 % of total Hgb Final   Mean Plasma Glucose 12/25/2022 131  mg/dL Final   eAG (mmol/L) 89/85/7975 7.3  mmol/L Final  Office Visit on 09/01/2022  Component Date Value Ref Range Status   Iron 09/01/2022 59  50 - 180 mcg/dL Final   TIBC 93/78/7975 330  250 - 425 mcg/dL (calc) Final   %SAT 93/78/7975 18 (L)  20  - 48 % (calc) Final   Vitamin B-12 09/01/2022 319  200 - 1,100 pg/mL Final   PSA 09/01/2022 0.65  <  OR = 4.00 ng/mL Final   Testosterone  09/01/2022 273  250 - 827 ng/dL Final   WBC 93/78/7975 10.3  3.8 - 10.8 Thousand/uL Final   RBC 09/01/2022 4.64  4.20 - 5.80 Million/uL Final   Hemoglobin 09/01/2022 13.4  13.2 - 17.1 g/dL Final   HCT 93/78/7975 41.0  38.5 - 50.0 % Final   MCV 09/01/2022 88.4  80.0 - 100.0 fL Final   MCH 09/01/2022 28.9  27.0 - 33.0 pg Final   MCHC 09/01/2022 32.7  32.0 - 36.0 g/dL Final   RDW 93/78/7975 14.9  11.0 - 15.0 % Final   Platelets 09/01/2022 268  140 - 400 Thousand/uL Final   MPV 09/01/2022 10.7  7.5 - 12.5 fL Final   Neutro Abs 09/01/2022 7,200  1,500 - 7,800 cells/uL Final   Lymphs Abs 09/01/2022 1,967  850 - 3,900 cells/uL Final   Absolute Monocytes 09/01/2022 783  200 - 950 cells/uL Final   Eosinophils Absolute 09/01/2022 299  15 - 500 cells/uL Final   Basophils Absolute 09/01/2022 52  0 - 200 cells/uL Final   Neutrophils Relative % 09/01/2022 69.9  % Final   Total Lymphocyte 09/01/2022 19.1  % Final   Monocytes Relative 09/01/2022 7.6  % Final   Eosinophils Relative 09/01/2022 2.9  % Final   Basophils Relative 09/01/2022 0.5  % Final   Glucose, Bld 09/01/2022 120 (H)  65 - 99 mg/dL Final   BUN 93/78/7975 15  7 - 25 mg/dL Final   Creat 93/78/7975 1.01  0.70 - 1.30 mg/dL Final   eGFR 93/78/7975 89  > OR = 60 mL/min/1.70m2 Final   BUN/Creatinine Ratio 09/01/2022 SEE NOTE:  6 - 22 (calc) Final   Sodium 09/01/2022 139  135 - 146 mmol/L Final   Potassium 09/01/2022 4.5  3.5 - 5.3 mmol/L Final   Chloride 09/01/2022 105  98 - 110 mmol/L Final   CO2 09/01/2022 26  20 - 32 mmol/L Final   Calcium  09/01/2022 9.6  8.6 - 10.3 mg/dL Final   Total Protein 93/78/7975 7.1  6.1 - 8.1 g/dL Final   Albumin 93/78/7975 4.2  3.6 - 5.1 g/dL Final   Globulin 93/78/7975 2.9  1.9 - 3.7 g/dL (calc) Final   AG Ratio 09/01/2022 1.4  1.0 - 2.5 (calc) Final   Total Bilirubin  09/01/2022 0.5  0.2 - 1.2 mg/dL Final   Alkaline phosphatase (APISO) 09/01/2022 89  35 - 144 U/L Final   AST 09/01/2022 14  10 - 35 U/L Final   ALT 09/01/2022 21  9 - 46 U/L Final   Magnesium 09/01/2022 1.7  1.5 - 2.5 mg/dL Final   Cholesterol 93/78/7975 167  <200 mg/dL Final   HDL 93/78/7975 48  > OR = 40 mg/dL Final   Triglycerides 93/78/7975 208 (H)  <150 mg/dL Final   LDL Cholesterol (Calc) 09/01/2022 89  mg/dL (calc) Final   Total CHOL/HDL Ratio 09/01/2022 3.5  <4.9 (calc) Final   Non-HDL Cholesterol (Calc) 09/01/2022 119  <130 mg/dL (calc) Final   TSH 93/78/7975 2.00  0.40 - 4.50 mIU/L Final   Hgb A1c MFr Bld 09/01/2022 6.1 (H)  <5.7 % of total Hgb Final   Mean Plasma Glucose 09/01/2022 128  mg/dL Final   eAG (mmol/L) 93/78/7975 7.1  mmol/L Final   Vit D, 25-Hydroxy 09/01/2022 33  30 - 100 ng/mL Final  Office Visit on 05/03/2022  Component Date Value Ref Range Status   WBC 05/03/2022 8.6  3.8 - 10.8 Thousand/uL Final  RBC 05/03/2022 4.86  4.20 - 5.80 Million/uL Final   Hemoglobin 05/03/2022 13.9  13.2 - 17.1 g/dL Final   HCT 97/78/7975 40.8  38.5 - 50.0 % Final   MCV 05/03/2022 84.0  80.0 - 100.0 fL Final   MCH 05/03/2022 28.6  27.0 - 33.0 pg Final   MCHC 05/03/2022 34.1  32.0 - 36.0 g/dL Final   RDW 97/78/7975 14.9  11.0 - 15.0 % Final   Platelets 05/03/2022 257  140 - 400 Thousand/uL Final   MPV 05/03/2022 10.5  7.5 - 12.5 fL Final   Neutro Abs 05/03/2022 5,822  1,500 - 7,800 cells/uL Final   Lymphs Abs 05/03/2022 1,849  850 - 3,900 cells/uL Final   Absolute Monocytes 05/03/2022 654  200 - 950 cells/uL Final   Eosinophils Absolute 05/03/2022 232  15 - 500 cells/uL Final   Basophils Absolute 05/03/2022 43  0 - 200 cells/uL Final   Neutrophils Relative % 05/03/2022 67.7  % Final   Total Lymphocyte 05/03/2022 21.5  % Final   Monocytes Relative 05/03/2022 7.6  % Final   Eosinophils Relative 05/03/2022 2.7  % Final   Basophils Relative 05/03/2022 0.5  % Final   Glucose, Bld  05/03/2022 89  65 - 99 mg/dL Final   BUN 97/78/7975 14  7 - 25 mg/dL Final   Creat 97/78/7975 0.86  0.70 - 1.30 mg/dL Final   eGFR 97/78/7975 104  > OR = 60 mL/min/1.66m2 Final   BUN/Creatinine Ratio 05/03/2022 SEE NOTE:  6 - 22 (calc) Final   Sodium 05/03/2022 141  135 - 146 mmol/L Final   Potassium 05/03/2022 4.6  3.5 - 5.3 mmol/L Final   Chloride 05/03/2022 106  98 - 110 mmol/L Final   CO2 05/03/2022 28  20 - 32 mmol/L Final   Calcium  05/03/2022 9.5  8.6 - 10.3 mg/dL Final   Total Protein 97/78/7975 7.1  6.1 - 8.1 g/dL Final   Albumin 97/78/7975 4.1  3.6 - 5.1 g/dL Final   Globulin 97/78/7975 3.0  1.9 - 3.7 g/dL (calc) Final   AG Ratio 05/03/2022 1.4  1.0 - 2.5 (calc) Final   Total Bilirubin 05/03/2022 0.4  0.2 - 1.2 mg/dL Final   Alkaline phosphatase (APISO) 05/03/2022 84  35 - 144 U/L Final   AST 05/03/2022 17  10 - 35 U/L Final   ALT 05/03/2022 18  9 - 46 U/L Final   Cholesterol 05/03/2022 150  <200 mg/dL Final   HDL 97/78/7975 42  > OR = 40 mg/dL Final   Triglycerides 97/78/7975 200 (H)  <150 mg/dL Final   LDL Cholesterol (Calc) 05/03/2022 78  mg/dL (calc) Final   Total CHOL/HDL Ratio 05/03/2022 3.6  <4.9 (calc) Final   Non-HDL Cholesterol (Calc) 05/03/2022 108  <130 mg/dL (calc) Final   Hgb J8r MFr Bld 05/03/2022 6.0 (H)  <5.7 % of total Hgb Final   Mean Plasma Glucose 05/03/2022 126  mg/dL Final   eAG (mmol/L) 97/78/7975 7.0  mmol/L Final   Vit D, 25-Hydroxy 05/03/2022 31  30 - 100 ng/mL Final  Office Visit on 01/18/2022  Component Date Value Ref Range Status   WBC 01/18/2022 8.9  3.8 - 10.8 Thousand/uL Final   RBC 01/18/2022 4.48  4.20 - 5.80 Million/uL Final   Hemoglobin 01/18/2022 13.3  13.2 - 17.1 g/dL Final   HCT 88/91/7976 39.5  38.5 - 50.0 % Final   MCV 01/18/2022 88.2  80.0 - 100.0 fL Final   MCH 01/18/2022 29.7  27.0 -  33.0 pg Final   MCHC 01/18/2022 33.7  32.0 - 36.0 g/dL Final   RDW 88/91/7976 14.4  11.0 - 15.0 % Final   Platelets 01/18/2022 281  140 - 400  Thousand/uL Final   MPV 01/18/2022 10.4  7.5 - 12.5 fL Final   Neutro Abs 01/18/2022 6,088  1,500 - 7,800 cells/uL Final   Lymphs Abs 01/18/2022 1,931  850 - 3,900 cells/uL Final   Absolute Monocytes 01/18/2022 676  200 - 950 cells/uL Final   Eosinophils Absolute 01/18/2022 187  15 - 500 cells/uL Final   Basophils Absolute 01/18/2022 18  0 - 200 cells/uL Final   Neutrophils Relative % 01/18/2022 68.4  % Final   Total Lymphocyte 01/18/2022 21.7  % Final   Monocytes Relative 01/18/2022 7.6  % Final   Eosinophils Relative 01/18/2022 2.1  % Final   Basophils Relative 01/18/2022 0.2  % Final   Glucose, Bld 01/18/2022 117 (H)  65 - 99 mg/dL Final   BUN 88/91/7976 20  7 - 25 mg/dL Final   Creat 88/91/7976 0.94  0.70 - 1.30 mg/dL Final   eGFR 88/91/7976 98  > OR = 60 mL/min/1.47m2 Final   BUN/Creatinine Ratio 01/18/2022 SEE NOTE:  6 - 22 (calc) Final   Sodium 01/18/2022 138  135 - 146 mmol/L Final   Potassium 01/18/2022 4.5  3.5 - 5.3 mmol/L Final   Chloride 01/18/2022 103  98 - 110 mmol/L Final   CO2 01/18/2022 26  20 - 32 mmol/L Final   Calcium  01/18/2022 9.9  8.6 - 10.3 mg/dL Final   Total Protein 88/91/7976 7.4  6.1 - 8.1 g/dL Final   Albumin 88/91/7976 4.3  3.6 - 5.1 g/dL Final   Globulin 88/91/7976 3.1  1.9 - 3.7 g/dL (calc) Final   AG Ratio 01/18/2022 1.4  1.0 - 2.5 (calc) Final   Total Bilirubin 01/18/2022 0.4  0.2 - 1.2 mg/dL Final   Alkaline phosphatase (APISO) 01/18/2022 87  35 - 144 U/L Final   AST 01/18/2022 21  10 - 35 U/L Final   ALT 01/18/2022 26  9 - 46 U/L Final   Magnesium 01/18/2022 1.8  1.5 - 2.5 mg/dL Final   Cholesterol 88/91/7976 158  <200 mg/dL Final   HDL 88/91/7976 41  > OR = 40 mg/dL Final   Triglycerides 88/91/7976 276 (H)  <150 mg/dL Final   LDL Cholesterol (Calc) 01/18/2022 81  mg/dL (calc) Final   Total CHOL/HDL Ratio 01/18/2022 3.9  <4.9 (calc) Final   Non-HDL Cholesterol (Calc) 01/18/2022 117  <130 mg/dL (calc) Final   TSH 88/91/7976 1.39  0.40 - 4.50  mIU/L Final   Hgb A1c MFr Bld 01/18/2022 6.0 (H)  <5.7 % of total Hgb Final   Mean Plasma Glucose 01/18/2022 126  mg/dL Final   eAG (mmol/L) 88/91/7976 7.0  mmol/L Final   Insulin  01/18/2022 60.8 (H)  uIU/mL Final   Vit D, 25-Hydroxy 01/18/2022 27 (L)  30 - 100 ng/mL Final  No image results found. No results found.       ASSESSMENT & PLAN   Assessment & Plan Nausea  Hyperlipidemia associated with type 2 diabetes mellitus (HCC)  Type 2 diabetes mellitus with stage 1 chronic kidney disease, without long-term current use of insulin  (HCC)  Hypertension, unspecified type  Low testosterone   Vitamin D  deficiency  Hypogonadism in male   {Assessment and Plan Assessment & Plan Type 2 diabetes mellitus   Diabetes management continues with Ozempic , but he experiences nausea and weight gain.  Mounjaro  is a preferred option due to its benefits for diabetes and fatty liver, offering better weight loss, less nausea, and improved glycemic control. Insurance coverage is uncertain. Submit prior authorization and appeal for Mounjaro . If not approved, continue Ozempic  and consider increasing the dose. Prescribe Zofran  for nausea if continuing Ozempic .  Obesity   Weight management is challenging with recent weight gain to 249 lbs from 241 lbs in June after stopping medications. Mounjaro  is preferred for better weight management. Emphasize resistance training and dietary changes to reduce sugary and starchy foods. Submit prior authorization and appeal for Mounjaro . If not approved, continue Ozempic  and consider increasing the dose. Encourage resistance training and reduction of sugary and starchy foods.  Fatty liver disease   Fatty liver disease is present. Mounjaro  is beneficial for this condition, but insurance coverage is uncertain. Submit prior authorization and appeal for Mounjaro . Encourage lifestyle modifications including weight loss and dietary changes.  Hypertension   Blood pressure is  elevated, likely due to recent weight gain and medication discontinuation. Currently on losartan  25 mg. Increase losartan  to 50 mg. Monitor blood pressure regularly and report if it remains above 130/80 mmHg. Adjust medication as needed based on monthly updates.  Nausea due to medication   Nausea from Ozempic  affects quality of life. Prescribe Zofran  if continuing Ozempic .  Iron deficiency anemia   Iron deficiency anemia is noted.  Hyperlipidemia   Hyperlipidemia is present.  Suspected sleep apnea   Suspected sleep apnea affects sleep quality.  Chronic kidney disease, unspecified   Chronic kidney disease is present.  Recording duration: 24 minutes   }  ORDER ASSOCIATIONS  #   DIAGNOSIS / CONDITION ICD-10 ENCOUNTER ORDER  No diagnosis found.       Orders Placed in Encounter:   Lab Orders  No laboratory test(s) ordered today   Imaging Orders  No imaging studies ordered today   Referral Orders  No referral(s) requested today   No orders of the defined types were placed in this encounter.   No orders of the defined types were placed in this encounter. ED Discharge Orders     None         This document was synthesized by artificial intelligence ([employer]bridge) using HIPAA-compliant recording of the clinical interaction;   We discussed the use of AI scribe software for clinical note transcription with the patient, who gave verbal consent to proceed. additional Info: This encounter employed state-of-the-art, real-time, collaborative documentation. The patient actively reviewed and assisted in updating their electronic medical record on a shared screen, ensuring transparency and facilitating joint problem-solving for the problem list, overview, and plan. This approach promotes accurate, informed care. The treatment plan was discussed and reviewed in detail, including medication safety, potential side effects, and all patient questions. We confirmed understanding and comfort with  the plan. Follow-up instructions were established, including contacting the office for any concerns, returning if symptoms worsen, persist, or new symptoms develop, and precautions for potential emergency department visits.

## 2023-12-10 NOTE — Patient Instructions (Signed)
We had a conversation today about healthy eating habits, exercise, calorie and carb goals for sustainable and successful weight loss. I gave him caloric and protein daily intake values and encouraged increasing fiber and water intake. I discussed weight loss medications that could be used with consideration of his specific past medical history and financial limitations.  I explained to him  the 3 things that almost all people who lose weight and keep it off do: 1.  Cut out all sugary beverages and all calorie-containing beverages. 2.  Resistance training (cardiac exercise not as important for maintaining muscle mass) 3.  Actually counting calories and carbs and deciding against foods that have high amounts - I.e. Check nutrition labels.   

## 2023-12-11 NOTE — Assessment & Plan Note (Signed)
 Shared decision-making done; patient understood rationale and agreed to [facility]

## 2023-12-11 NOTE — Assessment & Plan Note (Signed)
 Diabetes management continues with Ozempic , but he experiences nausea and weight gain. Mounjaro  is a preferred option due to its benefits for diabetes and fatty liver, offering better weight loss, less nausea, and improved glycemic control. Insurance coverage is uncertain. Submit prior authorization and appeal for Mounjaro . If not approved, continue Ozempic  and consider increasing the dose. Prescribe Zofran  for nausea if continuing Ozempic .

## 2023-12-11 NOTE — Assessment & Plan Note (Signed)
 Fatty liver disease   Fatty liver disease is present. Mounjaro  is beneficial for this condition, but insurance coverage is uncertain. Submit prior authorization and appeal for Mounjaro . Encourage lifestyle modifications including weight loss and dietary changes.

## 2023-12-11 NOTE — Assessment & Plan Note (Signed)
 Hyperlipidemia is present.

## 2023-12-12 ENCOUNTER — Ambulatory Visit: Payer: Self-pay | Admitting: Internal Medicine

## 2023-12-12 NOTE — Telephone Encounter (Signed)
 Tried to call pt not able to leave message and pt did not answer will send mychart message as well.

## 2023-12-20 ENCOUNTER — Other Ambulatory Visit (HOSPITAL_BASED_OUTPATIENT_CLINIC_OR_DEPARTMENT_OTHER): Payer: Self-pay

## 2024-01-10 ENCOUNTER — Other Ambulatory Visit (HOSPITAL_BASED_OUTPATIENT_CLINIC_OR_DEPARTMENT_OTHER): Payer: Self-pay

## 2024-03-10 ENCOUNTER — Encounter: Admitting: Internal Medicine

## 2024-04-04 ENCOUNTER — Other Ambulatory Visit: Payer: Self-pay

## 2024-04-04 ENCOUNTER — Other Ambulatory Visit (HOSPITAL_BASED_OUTPATIENT_CLINIC_OR_DEPARTMENT_OTHER): Payer: Self-pay

## 2024-04-04 DIAGNOSIS — I10 Essential (primary) hypertension: Secondary | ICD-10-CM

## 2024-04-04 MED ORDER — LOSARTAN POTASSIUM 50 MG PO TABS
50.0000 mg | ORAL_TABLET | Freq: Every day | ORAL | 3 refills | Status: DC
  Filled 2024-04-04: qty 90, 90d supply, fill #0

## 2024-04-04 NOTE — Telephone Encounter (Signed)
 Copied from CRM #[id number]. Topic: Clinical - Medication Refill >> Apr 03, 2024  4:25 PM Alexandria E wrote: Medication: losartan  (COZAAR ) 50 MG tablet  Has the patient contacted their pharmacy? No (Agent: If no, request that the patient contact the pharmacy for the refill. If patient does not wish to contact the pharmacy document the reason why and proceed with request.) (Agent: If yes, when and what did the pharmacy advise?)  This is the patient's preferred pharmacy:  [facility] [address] [place] [address] Phone: [phone] Fax: [phone]  Is this the correct pharmacy for this prescription? Yes If no, delete pharmacy and type the correct one.   Has the prescription been filled recently? No  Is the patient out of the medication? Yes  Has the patient been seen for an appointment in the last year OR does the patient have an upcoming appointment? Yes  Can we respond through MyChart? Yes  Agent: Please be advised that Rx refills may take up to 3 business days. We ask that you follow-up with your pharmacy.

## 2024-04-09 ENCOUNTER — Other Ambulatory Visit (HOSPITAL_BASED_OUTPATIENT_CLINIC_OR_DEPARTMENT_OTHER): Payer: Self-pay

## 2024-04-09 ENCOUNTER — Other Ambulatory Visit: Payer: Self-pay

## 2024-04-09 ENCOUNTER — Ambulatory Visit: Admitting: Internal Medicine

## 2024-04-09 ENCOUNTER — Encounter: Payer: Self-pay | Admitting: Internal Medicine

## 2024-04-09 VITALS — BP 120/80 | HR 102 | Temp 98.0°F | Ht 72.0 in | Wt 262.2 lb

## 2024-04-09 DIAGNOSIS — E119 Type 2 diabetes mellitus without complications: Secondary | ICD-10-CM

## 2024-04-09 DIAGNOSIS — Z23 Encounter for immunization: Secondary | ICD-10-CM

## 2024-04-09 DIAGNOSIS — E349 Endocrine disorder, unspecified: Secondary | ICD-10-CM

## 2024-04-09 DIAGNOSIS — E1122 Type 2 diabetes mellitus with diabetic chronic kidney disease: Secondary | ICD-10-CM | POA: Diagnosis not present

## 2024-04-09 DIAGNOSIS — R5383 Other fatigue: Secondary | ICD-10-CM

## 2024-04-09 DIAGNOSIS — N181 Chronic kidney disease, stage 1: Secondary | ICD-10-CM

## 2024-04-09 DIAGNOSIS — B351 Tinea unguium: Secondary | ICD-10-CM | POA: Diagnosis not present

## 2024-04-09 DIAGNOSIS — Z6835 Body mass index (BMI) 35.0-35.9, adult: Secondary | ICD-10-CM

## 2024-04-09 DIAGNOSIS — E782 Mixed hyperlipidemia: Secondary | ICD-10-CM | POA: Diagnosis not present

## 2024-04-09 DIAGNOSIS — E611 Iron deficiency: Secondary | ICD-10-CM | POA: Diagnosis not present

## 2024-04-09 DIAGNOSIS — I10 Essential (primary) hypertension: Secondary | ICD-10-CM | POA: Diagnosis not present

## 2024-04-09 DIAGNOSIS — Z7985 Long-term (current) use of injectable non-insulin antidiabetic drugs: Secondary | ICD-10-CM

## 2024-04-09 LAB — COMPREHENSIVE METABOLIC PANEL WITH GFR
ALT: 18 U/L (ref 3–53)
AST: 17 U/L (ref 5–37)
Albumin: 4.1 g/dL (ref 3.5–5.2)
Alkaline Phosphatase: 84 U/L (ref 39–117)
BUN: 18 mg/dL (ref 6–23)
CO2: 30 meq/L (ref 19–32)
Calcium: 9.6 mg/dL (ref 8.4–10.5)
Chloride: 104 meq/L (ref 96–112)
Creatinine, Ser: 1.1 mg/dL (ref 0.40–1.50)
GFR: 75.93 mL/min
Glucose, Bld: 110 mg/dL — ABNORMAL HIGH (ref 70–99)
Potassium: 4.9 meq/L (ref 3.5–5.1)
Sodium: 138 meq/L (ref 135–145)
Total Bilirubin: 0.4 mg/dL (ref 0.2–1.2)
Total Protein: 7.6 g/dL (ref 6.0–8.3)

## 2024-04-09 LAB — MICROALBUMIN / CREATININE URINE RATIO
Creatinine,U: 178.6 mg/dL
Microalb Creat Ratio: 1601.2 mg/g — ABNORMAL HIGH (ref 0.0–30.0)
Microalb, Ur: 286 mg/dL — ABNORMAL HIGH (ref 0.7–1.9)

## 2024-04-09 LAB — CBC WITH DIFFERENTIAL/PLATELET
Basophils Absolute: 0.1 10*3/uL (ref 0.0–0.1)
Basophils Relative: 0.5 % (ref 0.0–3.0)
Eosinophils Absolute: 0.3 10*3/uL (ref 0.0–0.7)
Eosinophils Relative: 3.2 % (ref 0.0–5.0)
HCT: 41.7 % (ref 39.0–52.0)
Hemoglobin: 14.2 g/dL (ref 13.0–17.0)
Lymphocytes Relative: 17.5 % (ref 12.0–46.0)
Lymphs Abs: 1.7 10*3/uL (ref 0.7–4.0)
MCHC: 33.9 g/dL (ref 30.0–36.0)
MCV: 86.6 fl (ref 78.0–100.0)
Monocytes Absolute: 0.7 10*3/uL (ref 0.1–1.0)
Monocytes Relative: 7.5 % (ref 3.0–12.0)
Neutro Abs: 6.9 10*3/uL (ref 1.4–7.7)
Neutrophils Relative %: 71.3 % (ref 43.0–77.0)
Platelets: 256 10*3/uL (ref 150.0–400.0)
RBC: 4.82 Mil/uL (ref 4.22–5.81)
RDW: 14.7 % (ref 11.5–15.5)
WBC: 9.7 10*3/uL (ref 4.0–10.5)

## 2024-04-09 LAB — HEMOGLOBIN A1C: Hgb A1c MFr Bld: 6.3 % (ref 4.6–6.5)

## 2024-04-09 LAB — LIPID PANEL
Cholesterol: 181 mg/dL (ref 28–200)
HDL: 45.2 mg/dL
LDL Cholesterol: 90 mg/dL (ref 10–99)
NonHDL: 135.44
Total CHOL/HDL Ratio: 4
Triglycerides: 226 mg/dL — ABNORMAL HIGH (ref 10.0–149.0)
VLDL: 45.2 mg/dL — ABNORMAL HIGH (ref 0.0–40.0)

## 2024-04-09 MED ORDER — LOSARTAN POTASSIUM 50 MG PO TABS: 50.0000 mg | ORAL_TABLET | Freq: Every day | ORAL | 3 refills | Status: AC

## 2024-04-09 MED ORDER — CICLOPIROX 8 % EX SOLN
Freq: Every day | CUTANEOUS | 0 refills | Status: AC
  Filled 2024-04-09: qty 6.6, 30d supply, fill #0

## 2024-04-09 MED ORDER — TIRZEPATIDE 2.5 MG/0.5ML ~~LOC~~ SOAJ
2.5000 mg | SUBCUTANEOUS | 11 refills | Status: AC
  Filled 2024-04-09: qty 2, 28d supply, fill #0

## 2024-04-09 MED ORDER — ROSUVASTATIN CALCIUM 40 MG PO TABS
ORAL_TABLET | ORAL | 3 refills | Status: AC
  Filled 2024-04-09: qty 65, fill #0

## 2024-04-09 MED ORDER — METFORMIN HCL ER 500 MG PO TB24
1000.0000 mg | ORAL_TABLET | Freq: Two times a day (BID) | ORAL | 3 refills | Status: AC
  Filled 2024-04-09: qty 360, 90d supply, fill #0

## 2024-04-09 NOTE — Patient Instructions (Addendum)
 It was a pleasure seeing you today! Your health and satisfaction are our top priorities.  Bernardino Cone, MD Try a multivitamin  [email or web address] [email or web address] VISIT SUMMARY: During your visit, we discussed your concerns about weight management and the effectiveness of your diabetes medication. We reviewed your recent weight changes, blood sugar levels, and overall health. We also addressed your hypertension, cholesterol, and other health issues.  YOUR PLAN: -TYPE 2 DIABETES MELLITUS: Type 2 diabetes is a condition where your body does not use insulin  properly, leading to high blood sugar levels. We discussed switching your medication from Ozempic  to Mounjaro  or Trulicity  due to nausea and weight gain. We also restarted Metformin  and referred you to a diabetic nutritionist. Blood work was ordered to assess your current diabetes control, and we discussed the potential use of Unicity supplements.  -OBESITY AND METABOLIC SYNDROME: Obesity and metabolic syndrome involve excess body weight and related health issues. We discussed lifestyle changes and dietary management to help with weight loss. You were referred to a diabetic nutritionist, and we discussed the potential use of Unicity supplements for weight management.  -HYPERTENSION: Hypertension is high blood pressure. Your blood pressure is well-controlled with your current medication, and you will continue this regimen.  -MIXED HYPERLIPIDEMIA: Mixed hyperlipidemia is a condition with high levels of cholesterol and triglycerides in the blood. You will continue taking Rosuvastatin , and we ordered a CT scan to assess for coronary artery disease.  -ONYCHOMYCOSIS: Onychomycosis is a fungal infection of the toenails. We prescribed Penlac  (antifungal toenail lacquer) to treat this condition.  -TESTOSTERONE  DEFICIENCY: Testosterone  deficiency can affect energy and motivation. We ordered a testosterone  level test to reassess your current  status.  -IRON DEFICIENCY: Iron deficiency can lead to anemia and fatigue. We ordered an iron level test to reassess your current status.  -FATTY LIVER: Fatty liver is a condition where fat builds up in the liver. We noted this condition and will continue to monitor it.  -GENERAL HEALTH MAINTENANCE: We discussed the importance of regular screenings and vaccinations. You received a pneumonia vaccination, and we encouraged regular eye exams.  INSTRUCTIONS: We will follow up with you after receiving the results of your blood work, CT scan, testosterone  level test, and iron level test. Please continue your current medications and follow the dietary and lifestyle recommendations discussed. Schedule an appointment with the diabetic nutritionist and ensure you have regular eye exams.  Your Providers PCP: Cone Bernardino MATSU, MD,  [phone]) Referring Provider: Cone Bernardino MATSU, MD,  [phone])  NEXT STEPS: [x]  Early Intervention: Schedule sooner appointment, call our on-call services, or go to emergency room if there is any significant Increase in pain or discomfort New or worsening symptoms Sudden or severe changes in your health [x]  Flexible Follow-Up: We recommend a Return in about 3 months (around 07/08/2024). for optimal routine care. This allows for progress monitoring and treatment adjustments. [x]  Preventive Care: Schedule your annual preventive care visit! It's typically covered by insurance and helps identify potential health issues early. [x]  Lab & X-ray Appointments: Incomplete tests scheduled today, or call to schedule. X-rays: [facility] (M-F, 8:30am-noon or 1pm-5pm). [x]  Medical Information Release: Sign a release form at front desk to obtain relevant medical information we don't have.  MAKING THE MOST OF OUR FOCUSED 20 MINUTE APPOINTMENTS: [x]   Clearly state your top concerns at the beginning of the visit to focus our discussion [x]   If you anticipate you  will need more time, please inform the front desk during scheduling -  we can book multiple appointments in the same week. [x]   If you have transportation problems- use our convenient video appointments or ask about transportation support. [x]   We can get down to business faster if you use MyChart to update information before the visit and submit non-urgent questions before your visit. Thank you for taking the time to provide details through MyChart.  Let our nurse know and she can import this information into your encounter documents.  Arrival and Wait Times: [x]   Arriving on time ensures that everyone receives prompt attention. [x]   Early morning (8a) and afternoon (1p) appointments tend to have shortest wait times. [x]   Unfortunately, we cannot delay appointments for late arrivals or hold slots during phone calls.  Getting Answers and Following Up [x]   Simple Questions & Concerns: For quick questions or basic follow-up after your visit, reach us  at [phone] or MyChart messaging. [x]   Complex Concerns: If your concern is more complex, scheduling an appointment might be best. Discuss this with the staff to find the most suitable option. [x]   Lab & Imaging Results: We'll contact you directly if results are abnormal or you don't use MyChart. Most normal results will be on MyChart within 2-3 business days, with a review message from Dr. Jesus. Haven't heard back in 2 weeks? Need results sooner? Contact us  at [phone]. [x]   Referrals: Our referral coordinator will manage specialist referrals. The specialist's office should contact you within 2 weeks to schedule an appointment. Call us  if you haven't heard from them after 2 weeks.  Staying Connected [x]   MyChart: Activate your MyChart for the fastest way to access results and message us . See the last page of this paperwork for instructions on how to activate.  Bring to Your Next Appointment [x]   Medications: Please bring all your  medication bottles to your next appointment to ensure we have an accurate record of your prescriptions. [x]   Health Diaries: If you're monitoring any health conditions at home, keeping a diary of your readings can be very helpful for discussions at your next appointment.  Billing [x]   X-ray & Lab Orders: These are billed by separate companies. Contact the invoicing company directly for questions or concerns. [x]   Visit Charges: Discuss any billing inquiries with our administrative services team.  Your Satisfaction Matters [x]   Share Your Experience: We strive for your satisfaction! If you have any complaints, or preferably compliments, please let Dr. Jesus know directly or contact our Practice Administrators, Manuelita Rubin or [employer], by asking at the front desk.   Reviewing Your Records [x]   Review this early draft of your clinical encounter notes below and the final encounter summary tomorrow on MyChart after its been completed.  All orders placed so far are visible here: Other fatigue  Type 2 diabetes mellitus without complication, without long-term current use of insulin  (HCC) -     metFORMIN  HCl ER; Take 2 tablets (1,000 mg total) by mouth 2 (two) times daily with a meal.  Dispense: 360 tablet; Refill: 3 -     Tirzepatide ; Inject 2.5 mg into the skin once a week. Replaces Wegovy  which causing vomiting and failing to control weight and diabetes mellitus.  Dispense: 2 mL; Refill: 11 -     CBC with Differential/Platelet -     Comprehensive metabolic panel with GFR -     Lipid panel -     Hemoglobin A1c -     Microalbumin / creatinine urine ratio -  CT CARDIAC SCORING (SELF PAY ONLY); Future -     Amb ref to Medical Nutrition Therapy-MNT  Type 2 diabetes mellitus with stage 1 chronic kidney disease, without long-term current use of insulin  (HCC) -     metFORMIN  HCl ER; Take 2 tablets (1,000 mg total) by mouth 2 (two) times daily with a meal.  Dispense: 360 tablet; Refill: 3 -      Tirzepatide ; Inject 2.5 mg into the skin once a week. Replaces Wegovy  which causing vomiting and failing to control weight and diabetes mellitus.  Dispense: 2 mL; Refill: 11 -     CBC with Differential/Platelet -     Comprehensive metabolic panel with GFR -     Lipid panel -     Hemoglobin A1c -     Microalbumin / creatinine urine ratio -     CT CARDIAC SCORING (SELF PAY ONLY); Future -     Amb ref to Medical Nutrition Therapy-MNT  Hypertension, unspecified type -     Losartan  Potassium; Take 1 tablet (50 mg total) by mouth daily.  Dispense: 90 tablet; Refill: 3  Hyperlipidemia, mixed -     Rosuvastatin  Calcium ; Take 1 tablet by mouth 5 times a  week for Cholesterol  Dispense: 65 tablet; Refill: 3  Onychomycosis -     Ciclopirox ; Apply topically at bedtime. Apply over nail and surrounding skin. Apply daily over previous coat. After seven (7) days, may remove with alcohol and continue cycle.  Dispense: 6.6 mL; Refill: 0  Iron deficiency -     Iron, TIBC and Ferritin Panel  Testosterone  deficiency -     Testosterone   Immunization due -     Pneumococcal conjugate vaccine 20-valent  Morbid obesity [facility])          [email or web address] [facility] [address], [place], [place] [address] Phone:[phone]      Aspect Pros Cons References  Safety & Tolerability Well-tolerated with low incidence of side effects; no clinically relevant adverse events in hematology, electrolytes, liver, or kidney markers Most common side effects include muscle pain, nervous disorders, fatigue, sleep disturbance, and headaches (though not serious) [1]  NAD+ Levels Effectively raises NAD+ levels in blood and some tissues with oral supplementation Unclear whether supplementation achieves meaningful intracellular NAD+ repletion in target organs; efficacy lower than preclinical studies suggested [2], [3], [4]  Cardiovascular  Modest reductions in blood pressure and improvements in arterial compliance; suppression of inflammatory activation in heart failure patients Limited hard clinical endpoints (e.g., cardiovascular events); small sample sizes and short study durations [2], [5], [6]  Metabolic Improved muscle insulin  sensitivity, insulin  signaling, and glucose disposal in some trials; reduced blood glucose without changes in insulin  secretion No consistent improvements in weight, BMI, fasting glucose, or HbA1c; heterogeneous results across studies [1], [2], [6]  Other Benefits Reduced fatigue and improved mental concentration in chronic fatigue syndrome; decreased inflammatory markers in Parkinson's disease; potential neuroprotective effects Benefits vary considerably by NAD+ precursor type, dose, and patient population [1], [2], [7]  Clinical Evidence Growing number of clinical trials showing promise in multiple age-related conditions Small sample sizes, varying dosing regimens, short durations limit interpretation; lack of standardized biomarkers to identify NAD+ deficiency [3], [4], [6]  Theoretical Risks Generally safe in short-term studies Potential long-term risks include accumulation of toxic metabolites, tumorigenesis, and promotion of cellular senescence (theoretical, not observed in trials to date) [8]  Cost-Benefit Relatively accessible as dietary supplement Optimal precursor type, dosing, treatment duration, and target populations remain undefined; microbiome interactions  may affect bioavailability [3], [9]    The evidence supports NAD+ supplementation as safe and capable of raising NAD+ levels, but clinical benefits remain modest and inconsistent. Large-scale, long-term randomized trials are needed to establish efficacy for specific conditions, optimal dosing strategies, and identification of patients most likely to benefit.[2][4] The lack of validated biomarkers to identify NAD+ deficiency in individual patients  remains a significant limitation for personalized therapy.[6]   Would you like me to review the latest literature on validated biomarkers or diagnostic methods for assessing NAD+ deficiency in patients, to help guide more targeted supplementation strategies? References Evaluation of Safety and Effectiveness of NAD in Different Clinical Conditions: A Systematic Review. Gindri IM, Ferrari G, Pinto LPS, et al. American Journal of Physiology. Endocrinology and Metabolism. 2024;326(4):E417-E427. doi:10.1152/ajpendo.99757.7976. Nicotinamide Adenine Dinucleotide in Aging Biology: Potential Applications and Many Unknowns. Bhasin S, Seals D, Migaud M, Musi N, [place]. Endocrine Reviews. 2023;44(6):1047-1073. doi:10.1210/endrev/bnad019. NAD+ Precursors in Human Health and Disease: Current Status and Future Prospects. Geoffery MARLA Risk T. Antioxidants & Redox Signaling. 2023;39(16-18):1133-1149. doi:10.1089/ars.2023.0354. Dietary Supplementation With NAD+-Boosting Compounds in Humans: Current Knowledge and Future Directions. Buelah LONNE Huntsman CC, Martens CR, Seals DR, [place]. The Journals of Gerontology. Series A, Orthoptist. 2023;78(12):2435-2448. doi:10.1093/gerona/glad106. Impact of Geroscience on Therapeutic Strategies for Older Adults With Cardiovascular Disease: Restaurant Manager, Fast Food. Mackie SHEERER, Kuchel GA, Ethyl SPEAK, et al. Journal of the [employer] of Cardiology. 2023;82(7):631-647. doi:10.1016/j.jacc.2023.05.038. NAD+-Increasing Strategies to Improve Cardiometabolic Health?. Blanco-Vaca F, Rotllan N, Canyelles M, et al. Frontiers in Endocrinology. 2021;12:815565. doi:10.3389/fendo.2021.815565. Importance of NAD+ Anabolism in Metabolic, Cardiovascular and Neurodegenerative Disorders. Helman T, Braidy N. Drugs & Aging. 2023;40(1):33-48. doi:10.1007/s40266-022-00989-0. NAD+ Therapy in Age-Related Degenerative Disorders: A Benefit/Risk Analysis. Heron LOISE Arminda PATRIC  Experimental Gerontology. 2020;132:110831. doi:10.1016/j.exger.7979.889168. Regulation of and Challenges in Targeting NAD+ Metabolism. Migaud ME, Ziegler M, Baur JA. Nature Reviews. Molecular Cell Biology. 2024;25(10):822-840. doi:10.1038/s41580-024-00752-w.

## 2024-04-09 NOTE — Assessment & Plan Note (Signed)
 Type 2 diabetes mellitus   He has experienced recent weight gain and persistent nausea despite using Ozempic , with blood sugar levels remaining below 130 in the last month. Ozempic  is ineffective for weight management and causes significant nausea. A switch to Mounjaro  or Trulicity  is considered, pending insurance approval. Emphasis was placed on lifestyle changes and dietary management to reduce medication dependency. Metformin  was restarted, and he was referred to a diabetic nutritionist. Blood work was ordered to assess current diabetes control, and the potential use of Unicity supplements was discussed.

## 2024-04-09 NOTE — Assessment & Plan Note (Signed)
 Despite previous weight loss with Trulicity , he has gained weight, now at 262 lbs from a recent 254 lbs. Lifestyle changes and dietary management were discussed to address obesity and metabolic syndrome. He was referred to a diabetic nutritionist, and the potential use of Unicity supplements for weight management was discussed.

## 2024-04-09 NOTE — Progress Notes (Signed)
 ==============================  [facility]: [phone]   -- Medical Office Visit --  Patient: Charles Harrell      Age: 54 y.o.       Sex:  male  Date:   04/09/2024 Today's Healthcare Provider: Bernardino KANDICE Cone, MD  ==============================   Chief Complaint: Diabetes and Medication Problem (Would like to discuss medication shot he is taking )   Discussed the use of AI scribe software for clinical note transcription with the patient, who gave verbal consent to proceed.  History of Present Illness Charles Harrell is a 54 year old male with diabetes and obesity who presents with concerns about weight management and medication efficacy.  He feels fatigued and experiences body aches, which he attributes to the cold weather. He has difficulty getting up and feels he has hit a plateau in his weight loss journey. His weight has fluctuated from 290 lbs to 241 lbs, and he is currently at 262 lbs, though he notes he was 254 lbs when weighed at home without clothes.  He has been on Ozempic  for diabetes management, taking it intermittently every six days. While it has helped reduce his appetite, it causes significant nausea. Despite this, he feels the medication is not effective as he is gaining weight. He previously experienced significant weight loss with Trulicity . His recent blood sugar levels have not exceeded 130 mg/dL. He has not been taking metformin  recently.  He has a history of anxiety for which he stopped taking medication, feeling more assertive and vocal without it. He is on cholesterol medication and acknowledges the need for lifelong use due to his diabetes and obesity. He is unable to perform a stress test due to knee issues. He is interested in a CT scan to assess for heart plaque.  He does not take any supplements other than vitamin D  and aspirin. He drinks tea, milk, water, and coffee, and avoids soft drinks. Despite being a investment banker, operational, he  acknowledges poor dietary habits, often opting for quick and easy foods.  No recent headaches and good blood pressure control despite occasional missed doses of medication. He has a history of a fungal infection on his toenails, which improved after quitting smoking but has since recurred.  Previsiti analysis  Patient summary: 54 year old male with type 2 diabetes (A1c 6.4%, CKD stage 1), hypertension, hyperlipidemia, morbid obesity (BMI 33.8), fatty liver, iron deficiency anemia, vitamin D  deficiency, and low testosterone . He has a history of depression (currently stable), suspected sleep apnea, and chronic medication side effects (nausea). Surgical history notable for root canal (2021). Recently restarted diabetes medications after financial hardship; now on Ozempic  but prefers Mounjaro .  Top issues & follow-up flags:  Diabetes: Stable A1c (6.4%), but random glucoses elevated; recent medication lapse and inconsistent Ozempic  dosing. CKD stage 1: Renal function stable (GFR >60), but new/progressive microalbuminuria (ratio 1,275.8 mg/g). Hypertension: BP elevated (140/80-146/94); losartan  dose recently increased to 50 mg. Hyperlipidemia: LDL controlled, but persistent mild hypertriglyceridemia (207 mg/dL). Iron deficiency anemia: Persistently low iron saturation; ferritin normal. Low testosterone : Confirmed on repeat labs (168 ng/dL); symptoms include fatigue. Fatty liver: Stable enzymes; Mounjaro  may offer benefit if insurance approves. Morbid obesity: Recent weight gain (8 lbs in 3 months), but overall significant loss from peak. Suspected sleep apnea: Poor sleep quality, daytime fatigue. Medication side effects: Ozempic -related nausea, impacts QOL. Preventive care gaps: Diabetic eye exam, vaccinations (Tdap, Shingrix, pneumococcal, hepatitis B, influenza), colonoscopy, foot exam.  Risks / red flags:  Microalbuminuria: New/progressive; risk for  diabetic nephropathy progression. Low  testosterone : May affect mood, energy, metabolic health; potential need for endocrinology referral. Iron deficiency anemia: Source unclear; possible chronic blood loss (GI/dental). Medication adherence: Recent financial hardship and dosing inconsistencies. Drug interactions: Polypharmacy; monitor for side effects, especially with antihypertensives and GLP-1 RAs. CKD risks: Avoid nephrotoxic meds (NSAIDs, PPIs, certain antibiotics). Persistent nausea: May impact nutrition, hydration, and adherence.  Visit agenda (ranked priorities):  Medication review: Confirm current dosing and adherence for diabetes, hypertension, statin, and iron/vitamin D ; address nausea and insurance status for Mounjaro . Renal follow-up: Address new microalbuminuria--repeat urine studies, reinforce kidney protection strategies. Preventive care: Update vaccinations, schedule overdue screenings (eye exam, colonoscopy, foot exam). Symptom assessment: Clarify sleep apnea symptoms, fatigue (testosterone ), and anemia source. Lifestyle counseling: Reinforce diet/exercise, set weight goals, discuss sleep hygiene.   Lab Results  Component Value Date   IRON 59 06/28/2023   IRON 59 09/01/2022   IRON 37 (L) 06/28/2020   TIBC 376.6 06/28/2023   TIBC 330 09/01/2022   TIBC 328 06/28/2020   FERRITIN 41.4 09/03/2023   FERRITIN 62.8 06/28/2023   FERRITIN 118 06/28/2020   IRONPCTSAT 15.7 (L) 06/28/2023   IRONPCTSAT 18 (L) 09/01/2022   IRONPCTSAT 11 (L) 06/28/2020   MCV 88.8 12/10/2023   MCV 88.3 09/03/2023   MCV 89.8 06/28/2023   HGB 14.2 12/10/2023   HGB 13.9 09/03/2023   HGB 14.5 06/28/2023     Wt Readings from Last 50 Encounters:  04/09/24 262 lb 3.2 oz (118.9 kg)  12/10/23 249 lb (112.9 kg)  09/03/23 241 lb 12.8 oz (109.7 kg)  05/31/23 242 lb 12.8 oz (110.1 kg)  03/26/23 255 lb 9.6 oz (115.9 kg)  12/25/22 256 lb 9.6 oz (116.4 kg)  11/07/22 260 lb (117.9 kg)  09/01/22 258 lb (117 kg)  05/03/22 265 lb (120.2 kg)   01/18/22 271 lb (122.9 kg)  10/17/21 283 lb 3.2 oz (128.5 kg)  07/12/21 287 lb 12.8 oz (130.5 kg)  03/24/21 286 lb 6.4 oz (129.9 kg)  10/11/20 282 lb (127.9 kg)  06/28/20 290 lb (131.5 kg)  03/24/20 279 lb 9.6 oz (126.8 kg)  03/17/20 279 lb (126.6 kg)  02/02/20 286 lb (129.7 kg)  01/19/20 286 lb (129.7 kg)  12/18/19 278 lb (126.1 kg)  09/30/19 (!) 302 lb 12.8 oz (137.3 kg)  08/28/19 299 lb 6.4 oz (135.8 kg)  05/28/19 (!) 306 lb 6.4 oz (139 kg)  02/25/19 296 lb 12.8 oz (134.6 kg)  12/17/18 292 lb 9.6 oz (132.7 kg)  12/03/18 286 lb (129.7 kg)  11/26/18 277 lb 9.6 oz (125.9 kg)  08/20/18 291 lb (132 kg)  05/24/18 299 lb (135.6 kg)  03/19/18 298 lb (135.2 kg)  02/06/18 (!) 303 lb 12.8 oz (137.8 kg)  09/17/17 (!) 302 lb (137 kg)  02/23/17 (!) 305 lb (138.3 kg)  11/08/16 (!) 303 lb 9.6 oz (137.7 kg)  05/12/16 292 lb (132.5 kg)  04/05/16 289 lb 3.2 oz (131.2 kg)   BMI Readings from Last 50 Encounters:  04/09/24 35.56 kg/m  12/10/23 33.77 kg/m  09/03/23 32.79 kg/m  05/31/23 32.93 kg/m  03/26/23 34.67 kg/m  12/25/22 33.85 kg/m  11/07/22 34.30 kg/m  09/01/22 34.04 kg/m  05/03/22 34.96 kg/m  01/18/22 35.75 kg/m  10/17/21 37.36 kg/m  07/12/21 37.97 kg/m  03/24/21 37.79 kg/m  10/11/20 37.21 kg/m  06/28/20 38.26 kg/m  03/24/20 36.89 kg/m  03/17/20 37.84 kg/m  02/02/20 38.79 kg/m  01/19/20 38.26 kg/m  12/18/19 37.19 kg/m  09/30/19 40.50 kg/m  08/28/19 40.05 kg/m  05/28/19 40.42 kg/m  02/25/19 39.16 kg/m  12/17/18 39.14 kg/m  12/03/18 38.26 kg/m  11/26/18 37.13 kg/m  08/20/18 38.92 kg/m  05/24/18 39.99 kg/m  03/19/18 39.86 kg/m  02/06/18 40.64 kg/m  09/17/17 40.12 kg/m  02/23/17 40.52 kg/m  11/08/16 40.33 kg/m  05/12/16 38.79 kg/m  04/05/16 38.42 kg/m     Background Reviewed: Problem List: has Essential hypertension; Hyperlipidemia associated with type 2 diabetes mellitus (HCC); Type 2 diabetes mellitus (HCC); Vitamin D   deficiency; Morbid obesity (HCC); 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 Past Medical History:  has a past medical history of Depression, Diabetes mellitus without complication (HCC), Hyperlipidemia, Hypertension, Morbid obesity (HCC), Plantar fasciitis, and Vitamin D  deficiency. Past Surgical History:   has a past surgical history that includes Root canal (2021). Social History:   reports that he quit smoking about 12 years ago. His smoking use included cigarettes. He has never used smokeless tobacco. He reports that he does not currently use drugs after having used the following drugs: Marijuana. He reports that he does not drink alcohol. Family History:  family history includes COPD in his father; Diabetes in his father; Early death in his father; Heart disease in his father; Hyperlipidemia in his father and mother; Hypertension in his father and mother; Lung cancer in his mother. Allergies:  is allergic to poison ivy extract.   Medication Reconciliation: Current Outpatient Medications on File Prior to Visit  Medication Sig   aspirin 81 MG chewable tablet Chew by mouth daily.   TURMERIC PO Take by mouth daily.   Semaglutide , 2 MG/DOSE, 8 MG/3ML SOPN Inject 2 mg into the skin once a week.   No current facility-administered medications on file prior to visit.   Medications Discontinued During This Encounter  Medication Reason   Cholecalciferol  (VITAMIN D3) 125 MCG (5000 UT) CAPS    escitalopram  (LEXAPRO ) 20 MG tablet    ondansetron  (ZOFRAN -ODT) 4 MG disintegrating tablet    metFORMIN  (GLUCOPHAGE -XR) 500 MG 24 hr tablet Reorder   rosuvastatin  (CRESTOR ) 40 MG tablet Reorder   tirzepatide  (MOUNJARO ) 2.5 MG/0.5ML Pen Reorder   losartan  (COZAAR ) 50 MG tablet Reorder     Physical Exam:    04/09/2024    10:58 AM 12/10/2023    9:13 AM 12/10/2023    8:57 AM  Vitals with BMI  Height 6' 0  6' 0  Weight 262 lbs 3 oz  249 lbs  BMI 35.55  33.76  Systolic 120 140 853  Diastolic 80 80 94  Pulse 102  73  Vital signs reviewed.  Nursing notes reviewed. Weight trend reviewed. Physical Activity: Not on file   General Appearance:  No acute distress appreciable.   Well-groomed, healthy-appearing male.  Well proportioned with no abnormal fat distribution.  Good muscle tone. Pulmonary:  Normal work of breathing at rest, no respiratory distress apparent. SpO2: 98 %  Musculoskeletal: All extremities are intact.  Neurological:  Awake, alert, oriented, and engaged.  No obvious focal neurological deficits or cognitive impairments.  Sensorium seems unclouded.   Speech is clear and coherent with logical content. Psychiatric:  Appropriate mood, pleasant and cooperative demeanor, thoughtful and engaged during the exam   Verbalized to patient: Physical Exam MEASUREMENTS: Weight- 262. 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 Pulses and sensitivity intact in feet. No cyanosis or  edema.   Results:   Verbalized to patient: Results Labs Testosterone : 168 Iron (06/2023): Improved compared to prior     04/09/2024   11:17 AM 12/10/2023    9:03 AM 09/03/2023    8:51 AM 05/31/2023    8:48 AM  PHQ 2/9 Scores  PHQ - 2 Score 1 0 2 0  PHQ- 9 Score 12 0  8       Data saved with a previous flowsheet row definition    Office Visit on 12/10/2023  Component Date Value Ref Range Status   WBC 12/10/2023 9.1  4.0 - 10.5 K/uL Final   RBC 12/10/2023 4.75  4.22 - 5.81 Mil/uL Final   Hemoglobin 12/10/2023 14.2  13.0 - 17.0 g/dL Final   HCT 90/70/7974 42.2  39.0 - 52.0 % Final   MCV 12/10/2023 88.8  78.0 - 100.0 fl Final   MCHC 12/10/2023 33.7  30.0 - 36.0 g/dL Final   RDW 90/70/7974 14.8  11.5 - 15.5 % Final   Platelets 12/10/2023 264.0  150.0 - 400.0 K/uL Final   Neutrophils Relative %  12/10/2023 71.3  43.0 - 77.0 % Final   Lymphocytes Relative 12/10/2023 17.9  12.0 - 46.0 % Final   Monocytes Relative 12/10/2023 7.8  3.0 - 12.0 % Final   Eosinophils Relative 12/10/2023 2.4  0.0 - 5.0 % Final   Basophils Relative 12/10/2023 0.6  0.0 - 3.0 % Final   Neutro Abs 12/10/2023 6.5  1.4 - 7.7 K/uL Final   Lymphs Abs 12/10/2023 1.6  0.7 - 4.0 K/uL Final   Monocytes Absolute 12/10/2023 0.7  0.1 - 1.0 K/uL Final   Eosinophils Absolute 12/10/2023 0.2  0.0 - 0.7 K/uL Final   Basophils Absolute 12/10/2023 0.1  0.0 - 0.1 K/uL Final   Sodium 12/10/2023 140  135 - 145 mEq/L Final   Potassium 12/10/2023 4.9  3.5 - 5.1 mEq/L Final   Chloride 12/10/2023 102  96 - 112 mEq/L Final   CO2 12/10/2023 31  19 - 32 mEq/L Final   Glucose, Bld 12/10/2023 114 (H)  70 - 99 mg/dL Final   BUN 90/70/7974 18  6 - 23 mg/dL Final   Creatinine, Ser 12/10/2023 1.07  0.40 - 1.50 mg/dL Final   Total Bilirubin 12/10/2023 0.4  0.2 - 1.2 mg/dL Final   Alkaline Phosphatase 12/10/2023 93  39 - 117 U/L Final   AST 12/10/2023 17  0 - 37 U/L Final   ALT 12/10/2023 20  0 - 53 U/L Final   Total Protein 12/10/2023 7.2  6.0 - 8.3 g/dL Final   Albumin 90/70/7974 4.3  3.5 - 5.2 g/dL Final   GFR 90/70/7974 78.67  >60.00 mL/min Final   Calcium  12/10/2023 10.2  8.4 - 10.5 mg/dL Final   Cholesterol 90/70/7974 174  0 - 200 mg/dL Final   Triglycerides 90/70/7974 207.0 (H)  0.0 - 149.0 mg/dL Final   HDL 90/70/7974 49.30  >39.00 mg/dL Final   VLDL 90/70/7974 41.4 (H)  0.0 - 40.0 mg/dL Final   LDL Cholesterol 12/10/2023 83  0 - 99 mg/dL Final   Total CHOL/HDL Ratio 12/10/2023 4   Final   NonHDL 12/10/2023 124.52   Final   Hgb A1c MFr Bld 12/10/2023 6.4  4.6 - 6.5 % Final   Microalb, Ur 12/10/2023 172.3 (H)  0.0 - 1.9 mg/dL Final   Creatinine,U 90/70/7974 135.0  mg/dL Final   Microalb Creat Ratio 12/10/2023 1,275.8 (H)  0.0 - 30.0 mg/g Final   VITD  12/10/2023 30.57  30.00 - 100.00 ng/mL Final   Testosterone  12/10/2023 168.04  (L)  300.00 - 890.00 ng/dL Final  Office Visit on 09/03/2023  Component Date Value Ref Range Status   WBC 09/03/2023 8.2  4.0 - 10.5 K/uL Final   RBC 09/03/2023 4.66  4.22 - 5.81 Mil/uL Final   Hemoglobin 09/03/2023 13.9  13.0 - 17.0 g/dL Final   HCT 93/76/7974 41.1  39.0 - 52.0 % Final   MCV 09/03/2023 88.3  78.0 - 100.0 fl Final   MCHC 09/03/2023 33.8  30.0 - 36.0 g/dL Final   RDW 93/76/7974 14.7  11.5 - 15.5 % Final   Platelets 09/03/2023 264.0  150.0 - 400.0 K/uL Final   Neutrophils Relative % 09/03/2023 69.0  43.0 - 77.0 % Final   Lymphocytes Relative 09/03/2023 20.7  12.0 - 46.0 % Final   Monocytes Relative 09/03/2023 7.0  3.0 - 12.0 % Final   Eosinophils Relative 09/03/2023 2.6  0.0 - 5.0 % Final   Basophils Relative 09/03/2023 0.7  0.0 - 3.0 % Final   Neutro Abs 09/03/2023 5.7  1.4 - 7.7 K/uL Final   Lymphs Abs 09/03/2023 1.7  0.7 - 4.0 K/uL Final   Monocytes Absolute 09/03/2023 0.6  0.1 - 1.0 K/uL Final   Eosinophils Absolute 09/03/2023 0.2  0.0 - 0.7 K/uL Final   Basophils Absolute 09/03/2023 0.1  0.0 - 0.1 K/uL Final   Sodium 09/03/2023 138  135 - 145 mEq/L Final   Potassium 09/03/2023 4.8  3.5 - 5.1 mEq/L Final   Chloride 09/03/2023 102  96 - 112 mEq/L Final   CO2 09/03/2023 26  19 - 32 mEq/L Final   Glucose, Bld 09/03/2023 115 (H)  70 - 99 mg/dL Final   BUN 93/76/7974 21  6 - 23 mg/dL Final   Creatinine, Ser 09/03/2023 1.02  0.40 - 1.50 mg/dL Final   Total Bilirubin 09/03/2023 0.4  0.2 - 1.2 mg/dL Final   Alkaline Phosphatase 09/03/2023 82  39 - 117 U/L Final   AST 09/03/2023 20  0 - 37 U/L Final   ALT 09/03/2023 22  0 - 53 U/L Final   Total Protein 09/03/2023 7.0  6.0 - 8.3 g/dL Final   Albumin 93/76/7974 4.2  3.5 - 5.2 g/dL Final   GFR 93/76/7974 83.48  >60.00 mL/min Final   Calcium  09/03/2023 9.8  8.4 - 10.5 mg/dL Final   Cholesterol 93/76/7974 199  0 - 200 mg/dL Final   Triglycerides 93/76/7974 203.0 (H)  0.0 - 149.0 mg/dL Final   HDL 93/76/7974 48.60  >39.00  mg/dL Final   VLDL 93/76/7974 40.6 (H)  0.0 - 40.0 mg/dL Final   LDL Cholesterol 09/03/2023 110 (H)  0 - 99 mg/dL Final   Total CHOL/HDL Ratio 09/03/2023 4   Final   NonHDL 09/03/2023 150.82   Final   Cortisol, Plasma 09/03/2023 13.8  ug/dL Final   Testosterone  09/03/2023 227.70 (L)  300.00 - 890.00 ng/dL Final   VITD 93/76/7974 41.28  30.00 - 100.00 ng/mL Final   Ferritin 09/03/2023 41.4  22.0 - 322.0 ng/mL Final   AST 09/03/2023 22  0 - 40 IU/L Final   ALT 09/03/2023 22  0 - 44 IU/L Final   Platelets 09/03/2023 CANCELED  [id number]/uL Final-Edited   FIB-4 Index 09/03/2023 CANCELED   Final-Edited  Lab on 06/28/2023  Component Date Value Ref Range Status   Insulin  06/28/2023 18.8 (H)  uIU/mL Final   Cholesterol 06/28/2023 148  0 - 200 mg/dL Final  Triglycerides 06/28/2023 136.0  0.0 - 149.0 mg/dL Final   HDL 95/82/7974 49.50  >39.00 mg/dL Final   VLDL 95/82/7974 27.2  0.0 - 40.0 mg/dL Final   LDL Cholesterol 06/28/2023 72  0 - 99 mg/dL Final   Total CHOL/HDL Ratio 06/28/2023 3   Final   NonHDL 06/28/2023 98.73   Final   Iron 06/28/2023 59  42 - 165 ug/dL Final   Transferrin 95/82/7974 269.0  212.0 - 360.0 mg/dL Final   Saturation Ratios 06/28/2023 15.7 (L)  20.0 - 50.0 % Final   Ferritin 06/28/2023 62.8  22.0 - 322.0 ng/mL Final   TIBC 06/28/2023 376.6  250.0 - 450.0 mcg/dL Final   Hgb J8r MFr Bld 06/28/2023 6.0  4.6 - 6.5 % Final   Sodium 06/28/2023 139  135 - 145 mEq/L Final   Potassium 06/28/2023 5.1  3.5 - 5.1 mEq/L Final   Chloride 06/28/2023 101  96 - 112 mEq/L Final   CO2 06/28/2023 30  19 - 32 mEq/L Final   Glucose, Bld 06/28/2023 123 (H)  70 - 99 mg/dL Final   BUN 95/82/7974 11  6 - 23 mg/dL Final   Creatinine, Ser 06/28/2023 0.98  0.40 - 1.50 mg/dL Final   Total Bilirubin 06/28/2023 0.6  0.2 - 1.2 mg/dL Final   Alkaline Phosphatase 06/28/2023 84  39 - 117 U/L Final   AST 06/28/2023 19  0 - 37 U/L Final   ALT 06/28/2023 20  0 - 53 U/L Final   Total Protein 06/28/2023 7.2   6.0 - 8.3 g/dL Final   Albumin 95/82/7974 4.5  3.5 - 5.2 g/dL Final   GFR 95/82/7974 87.70  >60.00 mL/min Final   Calcium  06/28/2023 10.0  8.4 - 10.5 mg/dL Final   WBC 95/82/7974 10.5  4.0 - 10.5 K/uL Final   RBC 06/28/2023 4.74  4.22 - 5.81 Mil/uL Final   Hemoglobin 06/28/2023 14.5  13.0 - 17.0 g/dL Final   HCT 95/82/7974 42.6  39.0 - 52.0 % Final   MCV 06/28/2023 89.8  78.0 - 100.0 fl Final   MCHC 06/28/2023 33.9  30.0 - 36.0 g/dL Final   RDW 95/82/7974 14.8  11.5 - 15.5 % Final   Platelets 06/28/2023 286.0  150.0 - 400.0 K/uL Final   Neutrophils Relative % 06/28/2023 65.8  43.0 - 77.0 % Final   Lymphocytes Relative 06/28/2023 23.2  12.0 - 46.0 % Final   Monocytes Relative 06/28/2023 7.2  3.0 - 12.0 % Final   Eosinophils Relative 06/28/2023 3.3  0.0 - 5.0 % Final   Basophils Relative 06/28/2023 0.5  0.0 - 3.0 % Final   Neutro Abs 06/28/2023 6.9  1.4 - 7.7 K/uL Final   Lymphs Abs 06/28/2023 2.4  0.7 - 4.0 K/uL Final   Monocytes Absolute 06/28/2023 0.8  0.1 - 1.0 K/uL Final   Eosinophils Absolute 06/28/2023 0.3  0.0 - 0.7 K/uL Final   Basophils Absolute 06/28/2023 0.1  0.0 - 0.1 K/uL Final   Testosterone  06/28/2023 221.04 (L)  300.00 - 890.00 ng/dL Final  Office Visit on 03/26/2023  Component Date Value Ref Range Status   WBC 03/26/2023 11.4 (H)  3.8 - 10.8 Thousand/uL Final   RBC 03/26/2023 4.94  4.20 - 5.80 Million/uL Final   Hemoglobin 03/26/2023 14.3  13.2 - 17.1 g/dL Final   HCT 98/86/7974 43.7  38.5 - 50.0 % Final   MCV 03/26/2023 88.5  80.0 - 100.0 fL Final   MCH 03/26/2023 28.9  27.0 - 33.0 pg Final  MCHC 03/26/2023 32.7  32.0 - 36.0 g/dL Final   RDW 98/86/7974 14.2  11.0 - 15.0 % Final   Platelets 03/26/2023 264  140 - 400 Thousand/uL Final   MPV 03/26/2023 11.1  7.5 - 12.5 fL Final   Neutro Abs 03/26/2023 8,322 (H)  1,500 - 7,800 cells/uL Final   Absolute Lymphocytes 03/26/2023 1,767  850 - 3,900 cells/uL Final   Absolute Monocytes 03/26/2023 1,094 (H)  200 - 950  cells/uL Final   Eosinophils Absolute 03/26/2023 160  15 - 500 cells/uL Final   Basophils Absolute 03/26/2023 57  0 - 200 cells/uL Final   Neutrophils Relative % 03/26/2023 73  % Final   Total Lymphocyte 03/26/2023 15.5  % Final   Monocytes Relative 03/26/2023 9.6  % Final   Eosinophils Relative 03/26/2023 1.4  % Final   Basophils Relative 03/26/2023 0.5  % Final   Glucose, Bld 03/26/2023 123 (H)  65 - 99 mg/dL Final   BUN 98/86/7974 16  7 - 25 mg/dL Final   Creat 98/86/7974 0.99  0.70 - 1.30 mg/dL Final   eGFR 98/86/7974 91  > OR = 60 mL/min/1.55m2 Final   BUN/Creatinine Ratio 03/26/2023 SEE NOTE:  6 - 22 (calc) Final   Sodium 03/26/2023 138  135 - 146 mmol/L Final   Potassium 03/26/2023 4.8  3.5 - 5.3 mmol/L Final   Chloride 03/26/2023 103  98 - 110 mmol/L Final   CO2 03/26/2023 26  20 - 32 mmol/L Final   Calcium  03/26/2023 9.6  8.6 - 10.3 mg/dL Final   Total Protein 98/86/7974 6.9  6.1 - 8.1 g/dL Final   Albumin 98/86/7974 4.0  3.6 - 5.1 g/dL Final   Globulin 98/86/7974 2.9  1.9 - 3.7 g/dL (calc) Final   AG Ratio 03/26/2023 1.4  1.0 - 2.5 (calc) Final   Total Bilirubin 03/26/2023 0.4  0.2 - 1.2 mg/dL Final   Alkaline phosphatase (APISO) 03/26/2023 79  35 - 144 U/L Final   AST 03/26/2023 21  10 - 35 U/L Final   ALT 03/26/2023 28  9 - 46 U/L Final   Magnesium 03/26/2023 1.7  1.5 - 2.5 mg/dL Final   Cholesterol 98/86/7974 178  <200 mg/dL Final   HDL 98/86/7974 53  > OR = 40 mg/dL Final   Triglycerides 98/86/7974 188 (H)  <150 mg/dL Final   LDL Cholesterol (Calc) 03/26/2023 97  mg/dL (calc) Final   Total CHOL/HDL Ratio 03/26/2023 3.4  <4.9 (calc) Final   Non-HDL Cholesterol (Calc) 03/26/2023 125  <130 mg/dL (calc) Final   TSH 98/86/7974 1.06  0.40 - 4.50 mIU/L Final   Hgb A1c MFr Bld 03/26/2023 6.3 (H)  <5.7 % of total Hgb Final   Mean Plasma Glucose 03/26/2023 134  mg/dL Final   eAG (mmol/L) 98/86/7974 7.4  mmol/L Final   Insulin  03/26/2023 89.3 (H)  uIU/mL Final   Vit D,  25-Hydroxy 03/26/2023 17 (L)  30 - 100 ng/mL Final  Office Visit on 12/25/2022  Component Date Value Ref Range Status   WBC 12/25/2022 10.1  3.8 - 10.8 Thousand/uL Final   RBC 12/25/2022 4.61  4.20 - 5.80 Million/uL Final   Hemoglobin 12/25/2022 13.4  13.2 - 17.1 g/dL Final   HCT 89/85/7975 41.2  38.5 - 50.0 % Final   MCV 12/25/2022 89.4  80.0 - 100.0 fL Final   MCH 12/25/2022 29.1  27.0 - 33.0 pg Final   MCHC 12/25/2022 32.5  32.0 - 36.0 g/dL Final   RDW 89/85/7975 14.4  11.0 - 15.0 % Final   Platelets 12/25/2022 280  140 - 400 Thousand/uL Final   MPV 12/25/2022 10.8  7.5 - 12.5 fL Final   Neutro Abs 12/25/2022 6,343  1,500 - 7,800 cells/uL Final   Lymphs Abs 12/25/2022 2,656  850 - 3,900 cells/uL Final   Absolute Monocytes 12/25/2022 798  200 - 950 cells/uL Final   Eosinophils Absolute 12/25/2022 253  15 - 500 cells/uL Final   Basophils Absolute 12/25/2022 51  0 - 200 cells/uL Final   Neutrophils Relative % 12/25/2022 62.8  % Final   Total Lymphocyte 12/25/2022 26.3  % Final   Monocytes Relative 12/25/2022 7.9  % Final   Eosinophils Relative 12/25/2022 2.5  % Final   Basophils Relative 12/25/2022 0.5  % Final   Glucose, Bld 12/25/2022 110 (H)  65 - 99 mg/dL Final   BUN 89/85/7975 24  7 - 25 mg/dL Final   Creat 89/85/7975 1.17  0.70 - 1.30 mg/dL Final   eGFR 89/85/7975 75  > OR = 60 mL/min/1.53m2 Final   BUN/Creatinine Ratio 12/25/2022 SEE NOTE:  6 - 22 (calc) Final   Sodium 12/25/2022 141  135 - 146 mmol/L Final   Potassium 12/25/2022 4.6  3.5 - 5.3 mmol/L Final   Chloride 12/25/2022 105  98 - 110 mmol/L Final   CO2 12/25/2022 26  20 - 32 mmol/L Final   Calcium  12/25/2022 9.8  8.6 - 10.3 mg/dL Final   Total Protein 89/85/7975 7.0  6.1 - 8.1 g/dL Final   Albumin 89/85/7975 4.1  3.6 - 5.1 g/dL Final   Globulin 89/85/7975 2.9  1.9 - 3.7 g/dL (calc) Final   AG Ratio 12/25/2022 1.4  1.0 - 2.5 (calc) Final   Total Bilirubin 12/25/2022 0.4  0.2 - 1.2 mg/dL Final   Alkaline  phosphatase (APISO) 12/25/2022 86  35 - 144 U/L Final   AST 12/25/2022 18  10 - 35 U/L Final   ALT 12/25/2022 24  9 - 46 U/L Final   Cholesterol 12/25/2022 145  <200 mg/dL Final   HDL 89/85/7975 48  > OR = 40 mg/dL Final   Triglycerides 89/85/7975 215 (H)  <150 mg/dL Final   LDL Cholesterol (Calc) 12/25/2022 69  mg/dL (calc) Final   Total CHOL/HDL Ratio 12/25/2022 3.0  <4.9 (calc) Final   Non-HDL Cholesterol (Calc) 12/25/2022 97  <130 mg/dL (calc) Final   Hgb J8r MFr Bld 12/25/2022 6.2 (H)  <5.7 % of total Hgb Final   Mean Plasma Glucose 12/25/2022 131  mg/dL Final   eAG (mmol/L) 89/85/7975 7.3  mmol/L Final  Office Visit on 09/01/2022  Component Date Value Ref Range Status   Iron 09/01/2022 59  50 - 180 mcg/dL Final   TIBC 93/78/7975 330  250 - 425 mcg/dL (calc) Final   %SAT 93/78/7975 18 (L)  20 - 48 % (calc) Final   Vitamin B-12 09/01/2022 319  200 - 1,100 pg/mL Final   PSA 09/01/2022 0.65  < OR = 4.00 ng/mL Final   Testosterone  09/01/2022 273  250 - 827 ng/dL Final   WBC 93/78/7975 10.3  3.8 - 10.8 Thousand/uL Final   RBC 09/01/2022 4.64  4.20 - 5.80 Million/uL Final   Hemoglobin 09/01/2022 13.4  13.2 - 17.1 g/dL Final   HCT 93/78/7975 41.0  38.5 - 50.0 % Final   MCV 09/01/2022 88.4  80.0 - 100.0 fL Final   MCH 09/01/2022 28.9  27.0 - 33.0 pg Final   MCHC 09/01/2022 32.7  32.0 -  36.0 g/dL Final   RDW 93/78/7975 14.9  11.0 - 15.0 % Final   Platelets 09/01/2022 268  140 - 400 Thousand/uL Final   MPV 09/01/2022 10.7  7.5 - 12.5 fL Final   Neutro Abs 09/01/2022 7,200  1,500 - 7,800 cells/uL Final   Lymphs Abs 09/01/2022 1,967  850 - 3,900 cells/uL Final   Absolute Monocytes 09/01/2022 783  200 - 950 cells/uL Final   Eosinophils Absolute 09/01/2022 299  15 - 500 cells/uL Final   Basophils Absolute 09/01/2022 52  0 - 200 cells/uL Final   Neutrophils Relative % 09/01/2022 69.9  % Final   Total Lymphocyte 09/01/2022 19.1  % Final   Monocytes Relative 09/01/2022 7.6  % Final    Eosinophils Relative 09/01/2022 2.9  % Final   Basophils Relative 09/01/2022 0.5  % Final   Glucose, Bld 09/01/2022 120 (H)  65 - 99 mg/dL Final   BUN 93/78/7975 15  7 - 25 mg/dL Final   Creat 93/78/7975 1.01  0.70 - 1.30 mg/dL Final   eGFR 93/78/7975 89  > OR = 60 mL/min/1.4m2 Final   BUN/Creatinine Ratio 09/01/2022 SEE NOTE:  6 - 22 (calc) Final   Sodium 09/01/2022 139  135 - 146 mmol/L Final   Potassium 09/01/2022 4.5  3.5 - 5.3 mmol/L Final   Chloride 09/01/2022 105  98 - 110 mmol/L Final   CO2 09/01/2022 26  20 - 32 mmol/L Final   Calcium  09/01/2022 9.6  8.6 - 10.3 mg/dL Final   Total Protein 93/78/7975 7.1  6.1 - 8.1 g/dL Final   Albumin 93/78/7975 4.2  3.6 - 5.1 g/dL Final   Globulin 93/78/7975 2.9  1.9 - 3.7 g/dL (calc) Final   AG Ratio 09/01/2022 1.4  1.0 - 2.5 (calc) Final   Total Bilirubin 09/01/2022 0.5  0.2 - 1.2 mg/dL Final   Alkaline phosphatase (APISO) 09/01/2022 89  35 - 144 U/L Final   AST 09/01/2022 14  10 - 35 U/L Final   ALT 09/01/2022 21  9 - 46 U/L Final   Magnesium 09/01/2022 1.7  1.5 - 2.5 mg/dL Final   Cholesterol 93/78/7975 167  <200 mg/dL Final   HDL 93/78/7975 48  > OR = 40 mg/dL Final   Triglycerides 93/78/7975 208 (H)  <150 mg/dL Final   LDL Cholesterol (Calc) 09/01/2022 89  mg/dL (calc) Final   Total CHOL/HDL Ratio 09/01/2022 3.5  <4.9 (calc) Final   Non-HDL Cholesterol (Calc) 09/01/2022 119  <130 mg/dL (calc) Final   TSH 93/78/7975 2.00  0.40 - 4.50 mIU/L Final   Hgb A1c MFr Bld 09/01/2022 6.1 (H)  <5.7 % of total Hgb Final   Mean Plasma Glucose 09/01/2022 128  mg/dL Final   eAG (mmol/L) 93/78/7975 7.1  mmol/L Final   Vit D, 25-Hydroxy 09/01/2022 33  30 - 100 ng/mL Final  Office Visit on 05/03/2022  Component Date Value Ref Range Status   WBC 05/03/2022 8.6  3.8 - 10.8 Thousand/uL Final   RBC 05/03/2022 4.86  4.20 - 5.80 Million/uL Final   Hemoglobin 05/03/2022 13.9  13.2 - 17.1 g/dL Final   HCT 97/78/7975 40.8  38.5 - 50.0 % Final   MCV  05/03/2022 84.0  80.0 - 100.0 fL Final   MCH 05/03/2022 28.6  27.0 - 33.0 pg Final   MCHC 05/03/2022 34.1  32.0 - 36.0 g/dL Final   RDW 97/78/7975 14.9  11.0 - 15.0 % Final   Platelets 05/03/2022 257  140 - 400 Thousand/uL Final  MPV 05/03/2022 10.5  7.5 - 12.5 fL Final   Neutro Abs 05/03/2022 5,822  1,500 - 7,800 cells/uL Final   Lymphs Abs 05/03/2022 1,849  850 - 3,900 cells/uL Final   Absolute Monocytes 05/03/2022 654  200 - 950 cells/uL Final   Eosinophils Absolute 05/03/2022 232  15 - 500 cells/uL Final   Basophils Absolute 05/03/2022 43  0 - 200 cells/uL Final   Neutrophils Relative % 05/03/2022 67.7  % Final   Total Lymphocyte 05/03/2022 21.5  % Final   Monocytes Relative 05/03/2022 7.6  % Final   Eosinophils Relative 05/03/2022 2.7  % Final   Basophils Relative 05/03/2022 0.5  % Final   Glucose, Bld 05/03/2022 89  65 - 99 mg/dL Final   BUN 97/78/7975 14  7 - 25 mg/dL Final   Creat 97/78/7975 0.86  0.70 - 1.30 mg/dL Final   eGFR 97/78/7975 104  > OR = 60 mL/min/1.16m2 Final   BUN/Creatinine Ratio 05/03/2022 SEE NOTE:  6 - 22 (calc) Final   Sodium 05/03/2022 141  135 - 146 mmol/L Final   Potassium 05/03/2022 4.6  3.5 - 5.3 mmol/L Final   Chloride 05/03/2022 106  98 - 110 mmol/L Final   CO2 05/03/2022 28  20 - 32 mmol/L Final   Calcium  05/03/2022 9.5  8.6 - 10.3 mg/dL Final   Total Protein 97/78/7975 7.1  6.1 - 8.1 g/dL Final   Albumin 97/78/7975 4.1  3.6 - 5.1 g/dL Final   Globulin 97/78/7975 3.0  1.9 - 3.7 g/dL (calc) Final   AG Ratio 05/03/2022 1.4  1.0 - 2.5 (calc) Final   Total Bilirubin 05/03/2022 0.4  0.2 - 1.2 mg/dL Final   Alkaline phosphatase (APISO) 05/03/2022 84  35 - 144 U/L Final   AST 05/03/2022 17  10 - 35 U/L Final   ALT 05/03/2022 18  9 - 46 U/L Final   Cholesterol 05/03/2022 150  <200 mg/dL Final   HDL 97/78/7975 42  > OR = 40 mg/dL Final   Triglycerides 97/78/7975 200 (H)  <150 mg/dL Final   LDL Cholesterol (Calc) 05/03/2022 78  mg/dL (calc) Final    Total CHOL/HDL Ratio 05/03/2022 3.6  <4.9 (calc) Final   Non-HDL Cholesterol (Calc) 05/03/2022 108  <130 mg/dL (calc) Final   Hgb J8r MFr Bld 05/03/2022 6.0 (H)  <5.7 % of total Hgb Final   Mean Plasma Glucose 05/03/2022 126  mg/dL Final   eAG (mmol/L) 97/78/7975 7.0  mmol/L Final   Vit D, 25-Hydroxy 05/03/2022 31  30 - 100 ng/mL Final   Diabetic foot exam was performed with the following findings:   No deformities, ulcerations, or other skin breakdown Normal sensation of 10g monofilament Intact posterior tibialis and dorsalis pedis pulses Onychomycosis lmited, interdigital warty lesion.           ASSESSMENT & PLAN   Assessment & Plan Type 2 diabetes mellitus without complication, without long-term current use of insulin  (HCC) Type 2 diabetes mellitus with stage 1 chronic kidney disease, without long-term current use of insulin  (HCC) Type 2 diabetes mellitus   He has experienced recent weight gain and persistent nausea despite using Ozempic , with blood sugar levels remaining below 130 in the last month. Ozempic  is ineffective for weight management and causes significant nausea. A switch to Mounjaro  or Trulicity  is considered, pending insurance approval. Emphasis was placed on lifestyle changes and dietary management to reduce medication dependency. Metformin  was restarted, and he was referred to a diabetic nutritionist. Blood work was ordered  to assess current diabetes control, and the potential use of Unicity supplements was discussed. Hypertension, unspecified type His hypertension is well-controlled with the current medication regimen, and blood pressure readings remain stable despite occasional missed doses. He will continue the current antihypertensive medication regimen. Hyperlipidemia, mixed Managed with Rosuvastatin , the importance of cardiovascular risk assessment due to diabetes and obesity was discussed. A CT scan was ordered to assess for coronary artery disease, and he will  continue Rosuvastatin . Other fatigue Shared decision-making done; patient understood rationale and agreed to labwork  Onychomycosis He has a fungal infection of the toenails, particularly affecting a sensitive toe, with previous improvement noted after smoking cessation. Penlac  (antifungal toenail lacquer) was prescribed. Iron deficiency Anemia showed previous improvement, with the last iron level check nine months ago. An iron level test was ordered to reassess his current status. Testosterone  deficiency His low testosterone  levels, previously at 168 ng/dL, were discussed regarding their potential impact on energy and motivation. A testosterone  level test was ordered to reassess his current status. Immunization due The importance of regular screenings and vaccinations was discussed. He has not had a recent eye exam but had one last year with good results. The importance of pneumonia vaccination for diabetics was discussed, and the pneumonia vaccination was administered. Regular eye exams were encouraged. Morbid obesity (HCC) Despite previous weight loss with Trulicity , he has gained weight, now at 262 lbs from a recent 254 lbs. Lifestyle changes and dietary management were discussed to address obesity and metabolic syndrome. He was referred to a diabetic nutritionist, and the potential use of Unicity supplements for weight management was discussed.   ORDER ASSOCIATIONS  #   DIAGNOSIS / CONDITION ICD-10 ENCOUNTER ORDER     ICD-10-CM   1. Other fatigue  R53.83     2. Type 2 diabetes mellitus without complication, without long-term current use of insulin  (HCC)  E11.9 metFORMIN  (GLUCOPHAGE -XR) 500 MG 24 hr tablet    tirzepatide  (MOUNJARO ) 2.5 MG/0.5ML Pen    CBC with Differential/Platelet    Comprehensive metabolic panel with GFR    Lipid panel    Hemoglobin A1c    Microalbumin / creatinine urine ratio    CT CARDIAC SCORING (SELF PAY ONLY)    Amb ref to Medical Nutrition Therapy-[facility]    3.  Type 2 diabetes mellitus with stage 1 chronic kidney disease, without long-term current use of insulin  (HCC)  E11.22 metFORMIN  (GLUCOPHAGE -XR) 500 MG 24 hr tablet   N18.1 tirzepatide  (MOUNJARO ) 2.5 MG/0.5ML Pen    CBC with Differential/Platelet    Comprehensive metabolic panel with GFR    Lipid panel    Hemoglobin A1c    Microalbumin / creatinine urine ratio    CT CARDIAC SCORING (SELF PAY ONLY)    Amb ref to Medical Nutrition Therapy-[facility]    4. Hypertension, unspecified type  I10 losartan  (COZAAR ) 50 MG tablet    5. Hyperlipidemia, mixed  E78.2 rosuvastatin  (CRESTOR ) 40 MG tablet    6. Onychomycosis  B35.1 ciclopirox  (PENLAC ) 8 % solution    7. Iron deficiency  E61.1 Iron, TIBC and Ferritin Panel    8. Testosterone  deficiency  E34.9 Testosterone     9. Immunization due  Z23 Pneumococcal conjugate vaccine 20-valent (Prevnar 20)    10. Morbid obesity (HCC)  E66.01          Orders Placed in Encounter:   Lab Orders         CBC with Differential/Platelet         Comprehensive metabolic panel  with GFR         Lipid panel         Hemoglobin A1c         Microalbumin / creatinine urine ratio         Iron, TIBC and Ferritin Panel         Testosterone      Imaging Orders         CT CARDIAC SCORING (SELF PAY ONLY)     Referral Orders         Amb ref to Medical Nutrition Therapy-[facility]     Meds ordered this encounter  Medications   metFORMIN  (GLUCOPHAGE -XR) 500 MG 24 hr tablet    Sig: Take 2 tablets (1,000 mg total) by mouth 2 (two) times daily with a meal.    Dispense:  360 tablet    Refill:  3   tirzepatide  (MOUNJARO ) 2.5 MG/0.5ML Pen    Sig: Inject 2.5 mg into the skin once a week. Replaces Wegovy  which causing vomiting and failing to control weight and diabetes mellitus.    Dispense:  2 mL    Refill:  11   losartan  (COZAAR ) 50 MG tablet    Sig: Take 1 tablet (50 mg total) by mouth daily.    Dispense:  90 tablet    Refill:  3   rosuvastatin  (CRESTOR ) 40 MG tablet    Sig:  Take 1 tablet by mouth 5 times a  week for Cholesterol    Dispense:  65 tablet    Refill:  3   ciclopirox  (PENLAC ) 8 % solution    Sig: Apply topically at bedtime. Apply over nail and surrounding skin. Apply daily over previous coat. After seven (7) days, may remove with alcohol and continue cycle.    Dispense:  6.6 mL    Refill:  0         This document was synthesized by artificial intelligence ([employer]) using HIPAA-compliant recording of the clinical interaction;   We discussed the use of AI scribe software for clinical note transcription with the patient, who gave verbal consent to proceed. additional Info: This encounter employed state-of-the-art, real-time, collaborative documentation. The patient actively reviewed and assisted in updating their electronic medical record on a shared screen, ensuring transparency and facilitating joint problem-solving for the problem list, overview, and plan. This approach promotes accurate, informed care. The treatment plan was discussed and reviewed in detail, including medication safety, potential side effects, and all patient questions. We confirmed understanding and comfort with the plan. Follow-up instructions were established, including contacting the office for any concerns, returning if symptoms worsen, persist, or new symptoms develop, and precautions for potential emergency department visits.

## 2024-04-10 LAB — IRON,TIBC AND FERRITIN PANEL
%SAT: 17 % — ABNORMAL LOW (ref 20–48)
Ferritin: 44 ng/mL (ref 38–380)
Iron: 59 ug/dL (ref 50–180)
TIBC: 350 ug/dL (ref 250–425)

## 2024-04-10 LAB — TESTOSTERONE: Testosterone: 176.19 ng/dL — ABNORMAL LOW (ref 300.00–890.00)

## 2024-04-13 ENCOUNTER — Other Ambulatory Visit (HOSPITAL_BASED_OUTPATIENT_CLINIC_OR_DEPARTMENT_OTHER): Payer: Self-pay

## 2024-04-13 ENCOUNTER — Other Ambulatory Visit: Payer: Self-pay

## 2024-04-13 ENCOUNTER — Ambulatory Visit: Payer: Self-pay | Admitting: Internal Medicine

## 2024-04-13 ENCOUNTER — Other Ambulatory Visit: Payer: Self-pay | Admitting: Internal Medicine

## 2024-04-13 DIAGNOSIS — E1129 Type 2 diabetes mellitus with other diabetic kidney complication: Secondary | ICD-10-CM

## 2024-04-13 MED ORDER — EMPAGLIFLOZIN 10 MG PO TABS
10.0000 mg | ORAL_TABLET | Freq: Every day | ORAL | 3 refills | Status: AC
  Filled 2024-04-13 – 2024-04-15 (×3): qty 90, 90d supply, fill #0

## 2024-04-14 ENCOUNTER — Other Ambulatory Visit (HOSPITAL_BASED_OUTPATIENT_CLINIC_OR_DEPARTMENT_OTHER): Payer: Self-pay

## 2024-04-15 ENCOUNTER — Other Ambulatory Visit (HOSPITAL_BASED_OUTPATIENT_CLINIC_OR_DEPARTMENT_OTHER): Payer: Self-pay

## 2024-07-21 ENCOUNTER — Ambulatory Visit: Admitting: Internal Medicine

## 2025-03-05 ENCOUNTER — Encounter: Admitting: Internal Medicine
